# Patient Record
Sex: Male | Born: 1955
Health system: Southern US, Community
[De-identification: ages and names within clinical notes are randomized; demographics above are authoritative.]

## PROBLEM LIST (undated history)

## (undated) DIAGNOSIS — F419 Anxiety disorder, unspecified: Secondary | ICD-10-CM

## (undated) DIAGNOSIS — N400 Enlarged prostate without lower urinary tract symptoms: Secondary | ICD-10-CM

## (undated) DIAGNOSIS — G43909 Migraine, unspecified, not intractable, without status migrainosus: Secondary | ICD-10-CM

## (undated) DIAGNOSIS — N390 Urinary tract infection, site not specified: Secondary | ICD-10-CM

## (undated) DIAGNOSIS — C61 Malignant neoplasm of prostate: Secondary | ICD-10-CM

## (undated) DIAGNOSIS — F329 Major depressive disorder, single episode, unspecified: Secondary | ICD-10-CM

## (undated) DIAGNOSIS — F32A Depression, unspecified: Secondary | ICD-10-CM

## (undated) DIAGNOSIS — K219 Gastro-esophageal reflux disease without esophagitis: Secondary | ICD-10-CM

## (undated) HISTORY — PX: BIOPSY PROSTATE: PRO28

---

## 2003-06-29 ENCOUNTER — Encounter: Admission: RE | Admit: 2003-06-29 | Discharge: 2003-07-13 | Payer: Self-pay | Admitting: Family Medicine

## 2008-07-13 ENCOUNTER — Observation Stay (HOSPITAL_COMMUNITY): Admission: EM | Admit: 2008-07-13 | Discharge: 2008-07-14 | Payer: Self-pay | Admitting: Emergency Medicine

## 2011-03-28 NOTE — H&P (Signed)
Craig Butler, REPSHER NO.:  [id number]   MEDICAL RECORD NO.:  [id number]          PATIENT TYPE:  OBV   LOCATION:  1831                         FACILITY:  [facility]   PHYSICIAN:  Hollice Espy, M.D.DATE OF BIRTH:  12-13-1955   DATE OF ADMISSION:  07/13/2008  DATE OF DISCHARGE:                              HISTORY & PHYSICAL   PRIMARY CARE PHYSICIAN:  Dr. Duane Lope.   CHIEF COMPLAINT:  Chest pain.   HISTORY OF PRESENT ILLNESS:  Patient is a 55 year old white male with  essentially no past medical history who presented to the emergency room  today complaining of chest pain.  He specifically described that while  he was driving all of a sudden he started having a sharp pain just left  of his sternum but radiated up to his shoulder.  Again he described it  as sharp and started having some numbness going down his arm.  He said  it was quite intense and he actually felt some shortness of breath.  The  pain lasted for about 10 minutes and then started to wear off and  eventually went away.  He became concerned and came into the emergency  room for further evaluation.  In the emergency room he had a chest x-ray  done which was unremarkable, an EKG done which showed a normal sinus  rhythm and labs done including cardiac markers were normal.  Currently  he is doing well.  Denies any headaches, vision changes, dysphagia.  No  current chest pain, no palpitations.  No wheezing, coughing, shortness  of breath, abdominal pain, hematuria, dysuria, constipation, diarrhea,  focal extremity numbness, weakness or pain.   REVIEW OF SYSTEMS:  Otherwise negative.   PAST MEDICAL HISTORY:  Includes sleep disturbance and depression.   MEDICATIONS:  1. Wellbutrin which he says he takes for fatigue.  2. Lunesta nightly.  3. Aspirin 325.   HE HAS NO KNOWN DRUG ALLERGIES.   SOCIAL HISTORY:  He denies any tobacco, heavy alcohol or drug abuse.   FAMILY HISTORY:  Negative for any type  of heart disease.   PHYSICAL EXAM:  THE PATIENT'S VITALS ON ADMISSION:  Temperature 97.8,  heart rate 70, blood pressure 147/93, respirations 19, O2 sat 96% on  room air.  After receiving a dose of nitroglycerin his blood pressure  came down to as low as 86/61.  GENERAL:  He is alert and oriented x3, in no apparent stress.  HEENT: Normocephalic/atraumatic, mucous membranes are moist, he had no  carotid bruits.  HEART:  Regular rate and rhythm, S1-S2.  LUNGS:  Clear to auscultation bilaterally.  ABDOMEN:  Soft, nontender, nondistended, positive bowel sounds.  EXTREMITIES:  Shows no clubbing, cyanosis or edema.   Lab work, chest X-Ray and EKG are unremarkable.  White count 7.4, H&H  15.1 and 46, MCV of 92, blood count 233, CPK 60.9, MB less than 1,  troponin-I less than 0.05, second set is similar.  Sodium 140, potassium  3.9, chloride is 104, BUN 13, creatinine 1.2, glucose 98,   ASSESSMENT AND PLAN:  1. Chest pain, atypical.  Only risk  factor is age.  Checked 3 sets of      enzymes, EKG negative.  If 3 sets are negative discharge home with      an outpatient stress test.  2. Sleep disturbance.  Will give him p.r.n. Ambien.      Hollice Espy, M.D.  Electronically Signed     SKK/MEDQ  D:  07/13/2008  T:  07/13/2008  Job:  [id number]   cc:   C. Duane Lope, M.D.

## 2011-08-16 LAB — CARDIAC PANEL(CRET KIN+CKTOT+MB+TROPI)
CK, MB: 0.8
Relative Index: INVALID
Total CK: 66
Troponin I: <0.01

## 2013-12-09 ENCOUNTER — Encounter (HOSPITAL_COMMUNITY): Payer: Self-pay | Admitting: Emergency Medicine

## 2013-12-09 ENCOUNTER — Emergency Department (HOSPITAL_COMMUNITY)
Admission: EM | Admit: 2013-12-09 | Discharge: 2013-12-09 | Disposition: A | Discharge status: Home / Self Care | Payer: BC Managed Care – PPO | Attending: Emergency Medicine | Admitting: Emergency Medicine

## 2013-12-09 ENCOUNTER — Emergency Department (HOSPITAL_COMMUNITY): Payer: BC Managed Care – PPO

## 2013-12-09 DIAGNOSIS — Z79899 Other long term (current) drug therapy: Secondary | ICD-10-CM | POA: Insufficient documentation

## 2013-12-09 DIAGNOSIS — R000 Tachycardia, unspecified: Secondary | ICD-10-CM | POA: Insufficient documentation

## 2013-12-09 DIAGNOSIS — IMO0002 Reserved for concepts with insufficient information to code with codable children: Secondary | ICD-10-CM | POA: Insufficient documentation

## 2013-12-09 DIAGNOSIS — K219 Gastro-esophageal reflux disease without esophagitis: Secondary | ICD-10-CM | POA: Insufficient documentation

## 2013-12-09 DIAGNOSIS — Z8679 Personal history of other diseases of the circulatory system: Secondary | ICD-10-CM | POA: Insufficient documentation

## 2013-12-09 DIAGNOSIS — R945 Abnormal results of liver function studies: Secondary | ICD-10-CM

## 2013-12-09 DIAGNOSIS — Z87891 Personal history of nicotine dependence: Secondary | ICD-10-CM | POA: Insufficient documentation

## 2013-12-09 DIAGNOSIS — R112 Nausea with vomiting, unspecified: Secondary | ICD-10-CM | POA: Insufficient documentation

## 2013-12-09 DIAGNOSIS — R7989 Other specified abnormal findings of blood chemistry: Secondary | ICD-10-CM | POA: Insufficient documentation

## 2013-12-09 DIAGNOSIS — N12 Tubulo-interstitial nephritis, not specified as acute or chronic: Secondary | ICD-10-CM | POA: Insufficient documentation

## 2013-12-09 DIAGNOSIS — R197 Diarrhea, unspecified: Secondary | ICD-10-CM | POA: Insufficient documentation

## 2013-12-09 DIAGNOSIS — Z7982 Long term (current) use of aspirin: Secondary | ICD-10-CM | POA: Insufficient documentation

## 2013-12-09 HISTORY — DX: Gastro-esophageal reflux disease without esophagitis: K21.9

## 2013-12-09 HISTORY — DX: Migraine, unspecified, not intractable, without status migrainosus: G43.909

## 2013-12-09 LAB — CBC WITH DIFFERENTIAL/PLATELET
Basophils Absolute: 0 10*3/uL (ref 0.0–0.1)
Basophils Relative: 0 % (ref 0–1)
Eosinophils Absolute: 0.1 10*3/uL (ref 0.0–0.7)
Eosinophils Relative: 1 % (ref 0–5)
HCT: 45.5 % (ref 39.0–52.0)
Hemoglobin: 15.7 g/dL (ref 13.0–17.0)
Lymphocytes Relative: 4 % — ABNORMAL LOW (ref 12–46)
Lymphs Abs: 0.5 10*3/uL — ABNORMAL LOW (ref 0.7–4.0)
MCH: 31.4 pg (ref 26.0–34.0)
MCHC: 34.5 g/dL (ref 30.0–36.0)
MCV: 91 fL (ref 78.0–100.0)
Monocytes Absolute: 0.7 10*3/uL (ref 0.1–1.0)
Monocytes Relative: 6 % (ref 3–12)
Neutro Abs: 9.9 10*3/uL — ABNORMAL HIGH (ref 1.7–7.7)
Neutrophils Relative %: 89 % — ABNORMAL HIGH (ref 43–77)
Platelets: 154 10*3/uL (ref 150–400)
RBC: 5 MIL/uL (ref 4.22–5.81)
RDW: 13.1 % (ref 11.5–15.5)
WBC: 11.1 10*3/uL — ABNORMAL HIGH (ref 4.0–10.5)

## 2013-12-09 LAB — URINE MICROSCOPIC-ADD ON

## 2013-12-09 LAB — COMPREHENSIVE METABOLIC PANEL
ALT: 160 U/L — ABNORMAL HIGH (ref 0–53)
AST: 129 U/L — ABNORMAL HIGH (ref 0–37)
Albumin: 3.3 g/dL — ABNORMAL LOW (ref 3.5–5.2)
Alkaline Phosphatase: 83 U/L (ref 39–117)
BUN: 16 mg/dL (ref 6–23)
CO2: 22 mEq/L (ref 19–32)
Calcium: 8 mg/dL — ABNORMAL LOW (ref 8.4–10.5)
Chloride: 104 mEq/L (ref 96–112)
Creatinine, Ser: 1.09 mg/dL (ref 0.50–1.35)
GFR calc Af Amer: 86 mL/min — ABNORMAL LOW (ref >90)
GFR calc non Af Amer: 74 mL/min — ABNORMAL LOW (ref >90)
Glucose, Bld: 111 mg/dL — ABNORMAL HIGH (ref 70–99)
Potassium: 4.4 mEq/L (ref 3.7–5.3)
Sodium: 138 mEq/L (ref 137–147)
Total Bilirubin: 1.4 mg/dL — ABNORMAL HIGH (ref 0.3–1.2)
Total Protein: 6.2 g/dL (ref 6.0–8.3)

## 2013-12-09 LAB — URINALYSIS, ROUTINE W REFLEX MICROSCOPIC
Glucose, UA: NEGATIVE mg/dL
Hgb urine dipstick: NEGATIVE
Ketones, ur: NEGATIVE mg/dL
Nitrite: POSITIVE — AB
Protein, ur: 30 mg/dL — AB
Specific Gravity, Urine: >1.046 — ABNORMAL HIGH (ref 1.005–1.030)
Urobilinogen, UA: 1 mg/dL (ref 0.0–1.0)
pH: 5.5 (ref 5.0–8.0)

## 2013-12-09 LAB — LIPASE, BLOOD: Lipase: 26 U/L (ref 11–59)

## 2013-12-09 MED ORDER — OXYCODONE HCL 5 MG PO TABS: 5.0000 mg | ORAL_TABLET | ORAL | Status: DC | PRN

## 2013-12-09 MED ORDER — SODIUM CHLORIDE 0.9 % IV BOLUS (SEPSIS)
1000.0000 mL | Freq: Once | INTRAVENOUS | Status: AC
  Administered 2013-12-09: 1000 mL via INTRAVENOUS

## 2013-12-09 MED ORDER — DEXTROSE 5 % IV SOLN
1.0000 g | Freq: Once | INTRAVENOUS | Status: AC
  Administered 2013-12-09: 1 g via INTRAVENOUS
  Filled 2013-12-09: qty 10

## 2013-12-09 MED ORDER — CEPHALEXIN 500 MG PO CAPS: 500.0000 mg | ORAL_CAPSULE | Freq: Four times a day (QID) | ORAL | Status: DC

## 2013-12-09 MED ORDER — MORPHINE SULFATE 4 MG/ML IJ SOLN
6.0000 mg | Freq: Once | INTRAMUSCULAR | Status: AC
  Administered 2013-12-09: 6 mg via INTRAVENOUS
  Filled 2013-12-09: qty 2

## 2013-12-09 MED ORDER — HYDROMORPHONE HCL PF 1 MG/ML IJ SOLN
1.0000 mg | Freq: Once | INTRAMUSCULAR | Status: AC
  Administered 2013-12-09: 1 mg via INTRAVENOUS
  Filled 2013-12-09: qty 1

## 2013-12-09 MED ORDER — OXYCODONE HCL 5 MG PO TABS
10.0000 mg | ORAL_TABLET | Freq: Once | ORAL | Status: AC
  Administered 2013-12-09: 10 mg via ORAL
  Filled 2013-12-09: qty 2

## 2013-12-09 MED ORDER — ONDANSETRON 4 MG PO TBDP
4.0000 mg | ORAL_TABLET | Freq: Once | ORAL | Status: AC
  Administered 2013-12-09: 4 mg via ORAL
  Filled 2013-12-09: qty 1

## 2013-12-09 NOTE — Discharge Instructions (Signed)

## 2013-12-09 NOTE — ED Provider Notes (Signed)
CSN: [id number]     Arrival date & time 12/09/13  1424 History   First MD Initiated Contact with Patient 12/09/13 1543     Chief Complaint  Patient presents with  . Fever  . Abdominal Pain   (Consider location/radiation/quality/duration/timing/severity/associated sxs/prior Treatment) HPI  58 year old male with abdominal pain. Onset around Sunday. Initially intermittent, but now more or less constant. Pain is in the right upper quadrant/right flank and radiates into his mid back. Associated with fever. Additionally nausea, vomiting and diarrhea. No history similar symptoms. He has a gallbladder. No past history kidney stones. No urinary complaints. No sick contacts.  Past Medical History  Diagnosis Date  . Acid reflux   . Migraine    History reviewed. No pertinent past surgical history. Family History  Problem Relation Age of Onset  . Cancer Father   . Cancer Brother    History  Substance Use Topics  . Smoking status: Former Research scientist (life sciences)  . Smokeless tobacco: Not on file  . Alcohol Use: Yes    Review of Systems  All systems reviewed and negative, other than as noted in HPI.   Allergies  Review of patient's allergies indicates no known allergies.  Home Medications   Current Outpatient Rx  Name  Route  Sig  Dispense  Refill  . aspirin 81 MG tablet   Oral   Take 81-162 mg by mouth daily.         Marland Kitchen buPROPion (WELLBUTRIN XL) 300 MG 24 hr tablet   Oral   Take 300 mg by mouth every morning.         . calcium & magnesium carbonates (MYLANTA) 629-528 MG per tablet   Oral   Take 1 tablet by mouth every morning.         . calcium carbonate (TUMS - DOSED IN MG ELEMENTAL CALCIUM) 500 MG chewable tablet   Oral   Chew 1 tablet by mouth at bedtime.         . fluticasone (FLONASE) 50 MCG/ACT nasal spray   Each Nare   Place 2 sprays into both nostrils daily.         Marland Kitchen omeprazole (PRILOSEC) 20 MG capsule   Oral   Take 20 mg by mouth daily.         . vitamin E 100  UNIT capsule   Oral   Take 100 Units by mouth every morning.          BP 100/49  Pulse 105  Temp(Src) 98.4 F (36.9 C) (Oral)  Resp 16  SpO2 95% Physical Exam  Nursing note and vitals reviewed. Constitutional: He appears well-developed and well-nourished. No distress.  HENT:  Head: Normocephalic and atraumatic.  Eyes: Conjunctivae are normal. Right eye exhibits no discharge. Left eye exhibits no discharge.  Neck: Neck supple.  Cardiovascular: Regular rhythm and normal heart sounds.  Exam reveals no gallop and no friction rub.   No murmur heard. Tachycardic  Pulmonary/Chest: Effort normal and breath sounds normal. No respiratory distress.  Abdominal: Soft. He exhibits no distension. There is tenderness.  Right flank tenderness. No rebound or guarding. No overlying skin changes. No distention. No surgical scars noted.  Genitourinary:  Right CVA tenderness  Musculoskeletal: He exhibits no edema and no tenderness.  Neurological: He is alert.  Skin: Skin is warm and dry.  Psychiatric: He has a normal mood and affect. His behavior is normal. Thought content normal.    ED Course  Procedures (including critical care time) Labs Review Labs  Reviewed  CBC WITH DIFFERENTIAL - Abnormal; Notable for the following:    WBC 11.1 (*)    Neutrophils Relative % 89 (*)    Neutro Abs 9.9 (*)    Lymphocytes Relative 4 (*)    Lymphs Abs 0.5 (*)    All other components within normal limits  URINALYSIS, ROUTINE W REFLEX MICROSCOPIC - Abnormal; Notable for the following:    Color, Urine ORANGE (*)    Specific Gravity, Urine >1.046 (*)    Bilirubin Urine MODERATE (*)    Protein, ur 30 (*)    Nitrite POSITIVE (*)    Leukocytes, UA SMALL (*)    All other components within normal limits  URINE MICROSCOPIC-ADD ON - Abnormal; Notable for the following:    Bacteria, UA FEW (*)    Casts HYALINE CASTS (*)    All other components within normal limits  COMPREHENSIVE METABOLIC PANEL - Abnormal;  Notable for the following:    Glucose, Bld 111 (*)    Calcium 8.0 (*)    Albumin 3.3 (*)    AST 129 (*)    ALT 160 (*)    Total Bilirubin 1.4 (*)    GFR calc non Af Amer 74 (*)    GFR calc Af Amer 86 (*)    All other components within normal limits  URINE CULTURE  LIPASE, BLOOD   Imaging Review US Abdomen Limited  12/09/2013   CLINICAL DATA:  Fever.  Right upper quadrant pain.  EXAM: US ABDOMEN LIMITED - RIGHT UPPER QUADRANT  COMPARISON:  None available.  FINDINGS: Gallbladder:  Gallbladder wall measures between 3 mm and 4 mm. No stones, sludge, pericholecystic fluid or wall thickening. Neg tenderness over the gallbladder.  Common bile duct:  Diameter: Normal at 4 mm.  Liver:  No focal lesion identified. Within normal limits in parenchymal echogenicity.  IMPRESSION: Negative for cholelithiasis or cholecystitis.   Electronically Signed   By: Dereck Ligas M.D.   On: 12/09/2013 21:17    EKG Interpretation   None       MDM   1. Pyelonephritis   2. Abnormal LFTs     57y male with right flank pain. Suspect pyelonephritis. Urinalysis is consistent with UTI. He is tender along his right flank and has right CVA tenderness. Consider cholecystitis, but doubt. This pain is more lateral than is typical for this.   [place] ultimately done because of abnormal LFTs. Not consistent with cholecystitis. Again gives support to pyelo. Unsure of significance of abnormal LFTs and if relates to current symptoms. Liver does not feel enlarged or tender. Outpt FU for repeat   Virgel Manifold, MD 12/11/13 1404

## 2013-12-09 NOTE — Progress Notes (Signed)
   CARE MANAGEMENT ED NOTE 12/09/2013  Patient:  Craig Butler, Craig Butler   Account Number:  [id number]  Date Initiated:  12/09/2013  Documentation initiated by:  Jackelyn Poling  Subjective/Objective Assessment:   58 yr old bcbs [place] ppo pt without pcp listed He confirms Dr Melinda Crutch as pcp     Subjective/Objective Assessment Detail:   c/o fever/ right upper quad pain since Sunday; n/v/d     Action/Plan:   EPIC updated   Action/Plan Detail:   Anticipated DC Date:       Status Recommendation to Physician:   Result of Recommendation:    Other ED Services  Consult [facility]  Other  Outpatient Services - Pt will follow up  PCP issues    Choice offered to / List presented to:            Status of service:  Completed, signed off  ED Comments:   ED Comments Detail:

## 2013-12-09 NOTE — ED Notes (Signed)
US at bedside

## 2013-12-09 NOTE — ED Notes (Signed)
Pt sent from [place] at [facility] for c/o fever/ right upper quad pain since Sunday; n/v/d

## 2013-12-10 LAB — URINE CULTURE: Colony Count: 40000

## 2013-12-15 ENCOUNTER — Other Ambulatory Visit: Payer: Self-pay | Admitting: Gastroenterology

## 2013-12-15 DIAGNOSIS — R1011 Right upper quadrant pain: Secondary | ICD-10-CM

## 2013-12-15 DIAGNOSIS — R7989 Other specified abnormal findings of blood chemistry: Secondary | ICD-10-CM

## 2013-12-15 DIAGNOSIS — R945 Abnormal results of liver function studies: Secondary | ICD-10-CM

## 2013-12-16 ENCOUNTER — Ambulatory Visit
Admission: RE | Admit: 2013-12-16 | Discharge: 2013-12-16 | Disposition: A | Discharge status: Home / Self Care | Payer: BC Managed Care – PPO | Source: Ambulatory Visit | Attending: Gastroenterology | Admitting: Gastroenterology

## 2013-12-16 DIAGNOSIS — R1011 Right upper quadrant pain: Secondary | ICD-10-CM

## 2013-12-16 DIAGNOSIS — R945 Abnormal results of liver function studies: Secondary | ICD-10-CM

## 2013-12-16 DIAGNOSIS — R7989 Other specified abnormal findings of blood chemistry: Secondary | ICD-10-CM

## 2013-12-16 MED ORDER — IOHEXOL 300 MG/ML  SOLN
125.0000 mL | Freq: Once | INTRAMUSCULAR | Status: AC | PRN
  Administered 2013-12-16: 125 mL via INTRAVENOUS

## 2014-12-23 ENCOUNTER — Other Ambulatory Visit (HOSPITAL_COMMUNITY): Payer: Self-pay | Admitting: Urology

## 2014-12-23 DIAGNOSIS — C61 Malignant neoplasm of prostate: Secondary | ICD-10-CM

## 2015-03-19 ENCOUNTER — Ambulatory Visit (HOSPITAL_COMMUNITY): Admission: RE | Admit: 2015-03-19 | Payer: Self-pay | Source: Ambulatory Visit

## 2015-07-24 ENCOUNTER — Inpatient Hospital Stay (HOSPITAL_COMMUNITY)
Admission: EM | Admit: 2015-07-24 | Discharge: 2015-07-30 | DRG: 862 | Disposition: A | Discharge status: Home Health | Payer: Managed Care, Other (non HMO) | Attending: Family Medicine | Admitting: Family Medicine

## 2015-07-24 ENCOUNTER — Encounter (HOSPITAL_COMMUNITY): Payer: Self-pay | Admitting: Internal Medicine

## 2015-07-24 ENCOUNTER — Emergency Department (HOSPITAL_COMMUNITY): Payer: Managed Care, Other (non HMO)

## 2015-07-24 DIAGNOSIS — G43909 Migraine, unspecified, not intractable, without status migrainosus: Secondary | ICD-10-CM | POA: Diagnosis present

## 2015-07-24 DIAGNOSIS — R140 Abdominal distension (gaseous): Secondary | ICD-10-CM

## 2015-07-24 DIAGNOSIS — N419 Inflammatory disease of prostate, unspecified: Secondary | ICD-10-CM | POA: Diagnosis present

## 2015-07-24 DIAGNOSIS — Z9889 Other specified postprocedural states: Secondary | ICD-10-CM | POA: Diagnosis not present

## 2015-07-24 DIAGNOSIS — E669 Obesity, unspecified: Secondary | ICD-10-CM | POA: Diagnosis present

## 2015-07-24 DIAGNOSIS — R338 Other retention of urine: Secondary | ICD-10-CM

## 2015-07-24 DIAGNOSIS — A419 Sepsis, unspecified organism: Secondary | ICD-10-CM | POA: Diagnosis present

## 2015-07-24 DIAGNOSIS — M549 Dorsalgia, unspecified: Secondary | ICD-10-CM | POA: Diagnosis present

## 2015-07-24 DIAGNOSIS — F419 Anxiety disorder, unspecified: Secondary | ICD-10-CM | POA: Diagnosis present

## 2015-07-24 DIAGNOSIS — K5909 Other constipation: Secondary | ICD-10-CM | POA: Diagnosis not present

## 2015-07-24 DIAGNOSIS — Z6837 Body mass index (BMI) 37.0-37.9, adult: Secondary | ICD-10-CM

## 2015-07-24 DIAGNOSIS — Z87891 Personal history of nicotine dependence: Secondary | ICD-10-CM | POA: Diagnosis not present

## 2015-07-24 DIAGNOSIS — N179 Acute kidney failure, unspecified: Secondary | ICD-10-CM | POA: Diagnosis present

## 2015-07-24 DIAGNOSIS — A4151 Sepsis due to Escherichia coli [E. coli]: Secondary | ICD-10-CM | POA: Diagnosis present

## 2015-07-24 DIAGNOSIS — K567 Ileus, unspecified: Secondary | ICD-10-CM | POA: Diagnosis not present

## 2015-07-24 DIAGNOSIS — T814XXA Infection following a procedure, initial encounter: Secondary | ICD-10-CM | POA: Diagnosis not present

## 2015-07-24 DIAGNOSIS — E872 Acidosis: Secondary | ICD-10-CM | POA: Diagnosis present

## 2015-07-24 DIAGNOSIS — F32A Depression, unspecified: Secondary | ICD-10-CM | POA: Diagnosis present

## 2015-07-24 DIAGNOSIS — N390 Urinary tract infection, site not specified: Secondary | ICD-10-CM | POA: Diagnosis present

## 2015-07-24 DIAGNOSIS — K5900 Constipation, unspecified: Secondary | ICD-10-CM

## 2015-07-24 DIAGNOSIS — Y838 Other surgical procedures as the cause of abnormal reaction of the patient, or of later complication, without mention of misadventure at the time of the procedure: Secondary | ICD-10-CM | POA: Diagnosis present

## 2015-07-24 DIAGNOSIS — I959 Hypotension, unspecified: Secondary | ICD-10-CM | POA: Diagnosis present

## 2015-07-24 DIAGNOSIS — T40605A Adverse effect of unspecified narcotics, initial encounter: Secondary | ICD-10-CM | POA: Diagnosis not present

## 2015-07-24 DIAGNOSIS — R509 Fever, unspecified: Secondary | ICD-10-CM

## 2015-07-24 DIAGNOSIS — Z79899 Other long term (current) drug therapy: Secondary | ICD-10-CM

## 2015-07-24 DIAGNOSIS — I5031 Acute diastolic (congestive) heart failure: Secondary | ICD-10-CM | POA: Diagnosis not present

## 2015-07-24 DIAGNOSIS — C61 Malignant neoplasm of prostate: Secondary | ICD-10-CM | POA: Diagnosis present

## 2015-07-24 DIAGNOSIS — K219 Gastro-esophageal reflux disease without esophagitis: Secondary | ICD-10-CM | POA: Diagnosis present

## 2015-07-24 DIAGNOSIS — R6520 Severe sepsis without septic shock: Secondary | ICD-10-CM | POA: Diagnosis present

## 2015-07-24 DIAGNOSIS — Z7982 Long term (current) use of aspirin: Secondary | ICD-10-CM | POA: Diagnosis not present

## 2015-07-24 DIAGNOSIS — F319 Bipolar disorder, unspecified: Secondary | ICD-10-CM | POA: Diagnosis present

## 2015-07-24 DIAGNOSIS — R319 Hematuria, unspecified: Secondary | ICD-10-CM | POA: Diagnosis not present

## 2015-07-24 DIAGNOSIS — F329 Major depressive disorder, single episode, unspecified: Secondary | ICD-10-CM | POA: Diagnosis present

## 2015-07-24 DIAGNOSIS — G8929 Other chronic pain: Secondary | ICD-10-CM | POA: Diagnosis present

## 2015-07-24 DIAGNOSIS — J9601 Acute respiratory failure with hypoxia: Secondary | ICD-10-CM

## 2015-07-24 DIAGNOSIS — K5901 Slow transit constipation: Secondary | ICD-10-CM | POA: Diagnosis not present

## 2015-07-24 HISTORY — DX: Major depressive disorder, single episode, unspecified: F32.9

## 2015-07-24 HISTORY — DX: Anxiety disorder, unspecified: F41.9

## 2015-07-24 HISTORY — DX: Depression, unspecified: F32.A

## 2015-07-24 LAB — RAPID URINE DRUG SCREEN, HOSP PERFORMED
Amphetamines: NOT DETECTED
Barbiturates: NOT DETECTED
Benzodiazepines: POSITIVE — AB
Cocaine: NOT DETECTED
OPIATES: NOT DETECTED
Tetrahydrocannabinol: NOT DETECTED

## 2015-07-24 LAB — URINALYSIS, ROUTINE W REFLEX MICROSCOPIC
BILIRUBIN URINE: NEGATIVE
GLUCOSE, UA: NEGATIVE mg/dL
KETONES UR: NEGATIVE mg/dL
Nitrite: NEGATIVE
PH: 7 (ref 5.0–8.0)
Protein, ur: NEGATIVE mg/dL
Specific Gravity, Urine: 1.015 (ref 1.005–1.030)
Urobilinogen, UA: 1 mg/dL (ref 0.0–1.0)

## 2015-07-24 LAB — COMPREHENSIVE METABOLIC PANEL
ALBUMIN: 4.2 g/dL (ref 3.5–5.0)
ALT: 24 U/L (ref 17–63)
AST: 23 U/L (ref 15–41)
Alkaline Phosphatase: 79 U/L (ref 38–126)
Anion gap: 9 (ref 5–15)
BILIRUBIN TOTAL: 1.3 mg/dL — AB (ref 0.3–1.2)
BUN: 18 mg/dL (ref 6–20)
CHLORIDE: 105 mmol/L (ref 101–111)
CO2: 22 mmol/L (ref 22–32)
CREATININE: 1.26 mg/dL — AB (ref 0.61–1.24)
Calcium: 9.2 mg/dL (ref 8.9–10.3)
GFR calc Af Amer: >60 mL/min (ref >60)
GFR calc non Af Amer: >60 mL/min (ref >60)
GLUCOSE: 117 mg/dL — AB (ref 65–99)
POTASSIUM: 3.9 mmol/L (ref 3.5–5.1)
Sodium: 136 mmol/L (ref 135–145)
Total Protein: 6.8 g/dL (ref 6.5–8.1)

## 2015-07-24 LAB — CBC WITH DIFFERENTIAL/PLATELET
Basophils Absolute: 0 10*3/uL (ref 0.0–0.1)
Basophils Relative: 0 % (ref 0–1)
EOS ABS: 0 10*3/uL (ref 0.0–0.7)
EOS PCT: 0 % (ref 0–5)
HCT: 46.8 % (ref 39.0–52.0)
Hemoglobin: 15.9 g/dL (ref 13.0–17.0)
LYMPHS ABS: 0.2 10*3/uL — AB (ref 0.7–4.0)
LYMPHS PCT: 3 % — AB (ref 12–46)
MCH: 31.1 pg (ref 26.0–34.0)
MCHC: 34 g/dL (ref 30.0–36.0)
MCV: 91.4 fL (ref 78.0–100.0)
MONO ABS: 0 10*3/uL — AB (ref 0.1–1.0)
Monocytes Relative: 1 % — ABNORMAL LOW (ref 3–12)
Neutro Abs: 7 10*3/uL (ref 1.7–7.7)
Neutrophils Relative %: 96 % — ABNORMAL HIGH (ref 43–77)
PLATELETS: 169 10*3/uL (ref 150–400)
RBC: 5.12 MIL/uL (ref 4.22–5.81)
RDW: 12.9 % (ref 11.5–15.5)
WBC: 7.3 10*3/uL (ref 4.0–10.5)

## 2015-07-24 LAB — APTT: APTT: 31 s (ref 24–37)

## 2015-07-24 LAB — I-STAT CG4 LACTIC ACID, ED
LACTIC ACID, VENOUS: 2.39 mmol/L — AB (ref 0.5–2.0)
Lactic Acid, Venous: 2.39 mmol/L (ref 0.5–2.0)

## 2015-07-24 LAB — PROTIME-INR
INR: 1.23 (ref 0.00–1.49)
Prothrombin Time: 15.6 seconds — ABNORMAL HIGH (ref 11.6–15.2)

## 2015-07-24 LAB — PROCALCITONIN: Procalcitonin: 47.01 ng/mL

## 2015-07-24 LAB — URINE MICROSCOPIC-ADD ON

## 2015-07-24 LAB — LACTIC ACID, PLASMA: LACTIC ACID, VENOUS: 1.6 mmol/L (ref 0.5–2.0)

## 2015-07-24 MED ORDER — CALCIUM CARBONATE ANTACID 500 MG PO CHEW
1.0000 | CHEWABLE_TABLET | Freq: Every day | ORAL | Status: DC
  Administered 2015-07-24 – 2015-07-26 (×3): 200 mg via ORAL
  Filled 2015-07-24 (×4): qty 1

## 2015-07-24 MED ORDER — GENTAMICIN SULFATE 40 MG/ML IJ SOLN
5.0000 mg/kg | Freq: Once | INTRAVENOUS | Status: DC
  Filled 2015-07-24: qty 10.75

## 2015-07-24 MED ORDER — SODIUM CHLORIDE 0.9 % IJ SOLN
3.0000 mL | Freq: Two times a day (BID) | INTRAMUSCULAR | Status: DC
  Administered 2015-07-24 – 2015-07-28 (×3): 3 mL via INTRAVENOUS

## 2015-07-24 MED ORDER — PANTOPRAZOLE SODIUM 40 MG PO TBEC
40.0000 mg | DELAYED_RELEASE_TABLET | Freq: Every day | ORAL | Status: DC
Start: 2015-07-24 — End: 2015-07-30
  Administered 2015-07-24 – 2015-07-29 (×6): 40 mg via ORAL
  Filled 2015-07-24 (×6): qty 1

## 2015-07-24 MED ORDER — GENTAMICIN SULFATE 40 MG/ML IJ SOLN
5.0000 mg/kg | INTRAVENOUS | Status: DC
  Filled 2015-07-24: qty 10.75

## 2015-07-24 MED ORDER — VANCOMYCIN HCL IN DEXTROSE 750-5 MG/150ML-% IV SOLN
750.0000 mg | Freq: Two times a day (BID) | INTRAVENOUS | Status: DC
  Filled 2015-07-24 (×2): qty 150

## 2015-07-24 MED ORDER — MORPHINE SULFATE (PF) 2 MG/ML IV SOLN
1.0000 mg | INTRAVENOUS | Status: DC | PRN
  Administered 2015-07-24 – 2015-07-26 (×8): 1 mg via INTRAVENOUS
  Filled 2015-07-24 (×8): qty 1

## 2015-07-24 MED ORDER — PIPERACILLIN-TAZOBACTAM 3.375 G IVPB
3.3750 g | Freq: Three times a day (TID) | INTRAVENOUS | Status: DC
  Administered 2015-07-25 – 2015-07-26 (×5): 3.375 g via INTRAVENOUS
  Filled 2015-07-24 (×9): qty 50

## 2015-07-24 MED ORDER — ONDANSETRON HCL 4 MG/2ML IJ SOLN
4.0000 mg | Freq: Four times a day (QID) | INTRAMUSCULAR | Status: DC | PRN
  Administered 2015-07-24: 4 mg via INTRAVENOUS
  Filled 2015-07-24: qty 2

## 2015-07-24 MED ORDER — ASPIRIN 81 MG PO CHEW
81.0000 mg | CHEWABLE_TABLET | Freq: Every day | ORAL | Status: DC
  Administered 2015-07-24 – 2015-07-26 (×3): 81 mg via ORAL
  Filled 2015-07-24 (×3): qty 1

## 2015-07-24 MED ORDER — PIPERACILLIN-TAZOBACTAM 3.375 G IVPB 30 MIN
3.3750 g | Freq: Three times a day (TID) | INTRAVENOUS | Status: DC
  Administered 2015-07-24: 3.375 g via INTRAVENOUS
  Filled 2015-07-24: qty 50

## 2015-07-24 MED ORDER — FLUTICASONE PROPIONATE 50 MCG/ACT NA SUSP
2.0000 | Freq: Every day | NASAL | Status: DC
  Administered 2015-07-24: 2 via NASAL
  Filled 2015-07-24 (×2): qty 16

## 2015-07-24 MED ORDER — SODIUM CHLORIDE 0.9 % IV BOLUS (SEPSIS)
1000.0000 mL | INTRAVENOUS | Status: AC
  Administered 2015-07-24 (×3): 1000 mL via INTRAVENOUS

## 2015-07-24 MED ORDER — IOHEXOL 300 MG/ML  SOLN
100.0000 mL | Freq: Once | INTRAMUSCULAR | Status: AC | PRN
  Administered 2015-07-24: 100 mL via INTRAVENOUS

## 2015-07-24 MED ORDER — MORPHINE SULFATE (PF) 2 MG/ML IV SOLN
1.0000 mg | Freq: Once | INTRAVENOUS | Status: DC
  Filled 2015-07-24: qty 1

## 2015-07-24 MED ORDER — OXYCODONE-ACETAMINOPHEN 5-325 MG PO TABS
2.0000 | ORAL_TABLET | Freq: Four times a day (QID) | ORAL | Status: DC | PRN
  Administered 2015-07-24 – 2015-07-26 (×5): 2 via ORAL
  Filled 2015-07-24 (×7): qty 2

## 2015-07-24 MED ORDER — SODIUM CHLORIDE 0.9 % IV BOLUS (SEPSIS)
1500.0000 mL | INTRAVENOUS | Status: AC
  Administered 2015-07-24 (×3): 1500 mL via INTRAVENOUS

## 2015-07-24 MED ORDER — DEXTROSE 5 % IV SOLN
7.0000 mg/kg | INTRAVENOUS | Status: DC
  Filled 2015-07-24: qty 15

## 2015-07-24 MED ORDER — ACETAMINOPHEN 650 MG RE SUPP: 650.0000 mg | Freq: Four times a day (QID) | RECTAL | Status: DC | PRN

## 2015-07-24 MED ORDER — VANCOMYCIN HCL 10 G IV SOLR
2250.0000 mg | Freq: Once | INTRAVENOUS | Status: AC
  Administered 2015-07-24: 2250 mg via INTRAVENOUS
  Filled 2015-07-24: qty 2250

## 2015-07-24 MED ORDER — SODIUM CHLORIDE 0.9 % IV SOLN
INTRAVENOUS | Status: DC
  Administered 2015-07-24 – 2015-07-26 (×2): via INTRAVENOUS

## 2015-07-24 MED ORDER — VITAMIN E 45 MG (100 UNIT) PO CAPS
100.0000 [IU] | ORAL_CAPSULE | Freq: Every morning | ORAL | Status: DC
  Administered 2015-07-24 – 2015-07-29 (×6): 100 [IU] via ORAL
  Filled 2015-07-24 (×8): qty 1

## 2015-07-24 MED ORDER — ALUM & MAG HYDROXIDE-SIMETH 200-200-20 MG/5ML PO SUSP
30.0000 mL | Freq: Four times a day (QID) | ORAL | Status: DC | PRN
  Administered 2015-07-27: 30 mL via ORAL
  Filled 2015-07-24: qty 30

## 2015-07-24 MED ORDER — ACETAMINOPHEN 325 MG PO TABS
650.0000 mg | ORAL_TABLET | Freq: Four times a day (QID) | ORAL | Status: DC | PRN
Start: 2015-07-24 — End: 2015-07-26
  Administered 2015-07-24: 650 mg via ORAL
  Filled 2015-07-24: qty 2

## 2015-07-24 MED ORDER — ONDANSETRON HCL 4 MG PO TABS: 4.0000 mg | ORAL_TABLET | Freq: Four times a day (QID) | ORAL | Status: DC | PRN

## 2015-07-24 NOTE — Consult Note (Signed)
Reason for Consult: Sepsis after Prostate Biopsy, Low Risk Prostate Cancer  Referring Physician: Pattricia Boss MD  Craig Butler is an 59 y.o. male.   HPI:   1 - Sepsis after Prostate Biopsy - fevers, tachycardia and hypotension by ER eval of subjective fevers 1 day after transrectal prostate biopsy. Had Levaquin peri-procedurally. Most recent UA pre-procedure w/o infectious parameters. CT pelvis from ER w/o hematoma / fluid collections / peraprostatic gas.  2 - Low Risk Prostate Cancer - Gleason 6 <5% two cores by biopsy 11/2014 on eval elevated and rising PSA. Initially on active surveillance with first surveillance biopsy path pending 07/2015. Most recent PSA 4.8.  Today "Craig Butler" is seen as emergent consult for above. We had discussed his clinical situation by phone earlier today and he went to ER as instructed.   Past Medical History  Diagnosis Date  . Acid reflux   . Migraine     No past surgical history on file.  Family History  Problem Relation Age of Onset  . Cancer Father   . Cancer Brother     Social History:  reports that he has quit smoking. He does not have any smokeless tobacco history on file. He reports that he drinks alcohol. His drug history is not on file.  Allergies: No Known Allergies  Medications: I have reviewed the patient's current medications.  Results for orders placed or performed during the hospital encounter of 07/24/15 (from the past 48 hour(s))  Urinalysis, Routine w reflex microscopic (not at [facility])     Status: Abnormal   Collection Time: 07/24/15  4:37 PM  Result Value Ref Range   Color, Urine YELLOW YELLOW   APPearance CLOUDY (A) CLEAR   Specific Gravity, Urine 1.015 1.005 - 1.030   pH 7.0 5.0 - 8.0   Glucose, UA NEGATIVE NEGATIVE mg/dL   Hgb urine dipstick TRACE (A) NEGATIVE   Bilirubin Urine NEGATIVE NEGATIVE   Ketones, ur NEGATIVE NEGATIVE mg/dL   Protein, ur NEGATIVE NEGATIVE mg/dL   Urobilinogen, UA 1.0 0.0 - 1.0 mg/dL   Nitrite  NEGATIVE NEGATIVE   Leukocytes, UA MODERATE (A) NEGATIVE  Urine microscopic-add on     Status: Abnormal   Collection Time: 07/24/15  4:37 PM  Result Value Ref Range   WBC, UA 21-50 <3 WBC/hpf   RBC / HPF 0-2 <3 RBC/hpf   Bacteria, UA MANY (A) RARE  Comprehensive metabolic panel     Status: Abnormal   Collection Time: 07/24/15  5:03 PM  Result Value Ref Range   Sodium 136 135 - 145 mmol/L   Potassium 3.9 3.5 - 5.1 mmol/L   Chloride 105 101 - 111 mmol/L   CO2 22 22 - 32 mmol/L   Glucose, Bld 117 (H) 65 - 99 mg/dL   BUN 18 6 - 20 mg/dL   Creatinine, Ser 1.26 (H) 0.61 - 1.24 mg/dL   Calcium 9.2 8.9 - 10.3 mg/dL   Total Protein 6.8 6.5 - 8.1 g/dL   Albumin 4.2 3.5 - 5.0 g/dL   AST 23 15 - 41 U/L   ALT 24 17 - 63 U/L   Alkaline Phosphatase 79 38 - 126 U/L   Total Bilirubin 1.3 (H) 0.3 - 1.2 mg/dL   GFR calc non Af Amer >60 >60 mL/min   GFR calc Af Amer >60 >60 mL/min    Comment: (NOTE) The eGFR has been calculated using the CKD EPI equation. This calculation has not been validated in all clinical situations. eGFR's persistently <60  mL/min signify possible Chronic Kidney Disease.    Anion gap 9 5 - 15  CBC WITH DIFFERENTIAL     Status: Abnormal   Collection Time: 07/24/15  5:03 PM  Result Value Ref Range   WBC 7.3 4.0 - 10.5 K/uL   RBC 5.12 4.22 - 5.81 MIL/uL   Hemoglobin 15.9 13.0 - 17.0 g/dL   HCT 46.8 39.0 - 52.0 %   MCV 91.4 78.0 - 100.0 fL   MCH 31.1 26.0 - 34.0 pg   MCHC 34.0 30.0 - 36.0 g/dL   RDW 12.9 11.5 - 15.5 %   Platelets 169 150 - 400 K/uL   Neutrophils Relative % 96 (H) 43 - 77 %   Neutro Abs 7.0 1.7 - 7.7 K/uL   Lymphocytes Relative 3 (L) 12 - 46 %   Lymphs Abs 0.2 (L) 0.7 - 4.0 K/uL   Monocytes Relative 1 (L) 3 - 12 %   Monocytes Absolute 0.0 (L) 0.1 - 1.0 K/uL   Eosinophils Relative 0 0 - 5 %   Eosinophils Absolute 0.0 0.0 - 0.7 K/uL   Basophils Relative 0 0 - 1 %   Basophils Absolute 0.0 0.0 - 0.1 K/uL  I-Stat CG4 Lactic Acid, ED  (not at  [facility])      Status: Abnormal   Collection Time: 07/24/15  5:19 PM  Result Value Ref Range   Lactic Acid, Venous 2.39 (HH) 0.5 - 2.0 mmol/L   Comment NOTIFIED PHYSICIAN     Dg Chest Port 1 View  07/24/2015   CLINICAL DATA:  Prostate biopsy performed yesterday, now with fever.  EXAM: PORTABLE CHEST - 1 VIEW  COMPARISON:  Chest radiograph 07/13/2008.  FINDINGS: Normal cardiomediastinal silhouette. No infiltrates or failure. No effusion or pneumothorax. Negative osseous structures.  IMPRESSION: Stable chest.   Electronically Signed   By: Staci Righter M.D.   On: 07/24/2015 18:13    Review of Systems  Constitutional: Positive for fever, chills and malaise/fatigue.  HENT: Negative.   Eyes: Negative.   Respiratory: Negative.   Cardiovascular: Negative.   Gastrointestinal: Negative.   Genitourinary: Positive for urgency. Negative for flank pain.  Musculoskeletal: Negative.   Skin: Negative.   Neurological: Negative.   Endo/Heme/Allergies: Negative.   Psychiatric/Behavioral: Negative.    Blood pressure 105/66, pulse 99, temperature 100.4 F (38 C), temperature source Oral, resp. rate 16, height _0  (1.778 m), weight 106.595 kg (235 lb), SpO2 98 %. Physical Exam  Constitutional: He appears well-developed and well-nourished.  Family at bedside  HENT:  Head: Normocephalic.  Eyes: Pupils are equal, round, and reactive to light.  Neck: Normal range of motion.  Cardiovascular:  Regular tachycardia  Respiratory: Effort normal.  GI: Soft.  Genitourinary: Penis normal.  NO SP TTP or distension  Musculoskeletal: Normal range of motion.  Neurological: He is alert.  Skin: Skin is warm. He is diaphoretic.  Psychiatric: He has a normal mood and affect. His behavior is normal. Judgment and thought content normal.    Assessment/Plan:  1 - Sepsis after Prostate Biopsy - agree with aggressive fluid resusication, monitoring, and double-coverage non-fluorquinolone ABX (Zosyn + Gent) empiric awaiting  further CX data from [place] obtained today.  No furhter CX data from our office to guide therapy.  No urinary retention clinically and favor no GU manipulation (catheters) if possible as may exacerbate.   CT pelvis tonight for baseline to r/o large or gas-containing fluid collections, and none noted.   2 - Low Risk Prostate  Cancer - further management pending most recent biopsy path.  Greatly appreciate hospitalist and ICU team comangement. Will follow daily and make Dr. Louis Meckel aware of pt's admission on Monday AM.    [facility], [place] 07/24/2015, 7:36 PM

## 2015-07-24 NOTE — H&P (Signed)
[facility] History and Physical  Craig Butler WHQ:[id number] DOB: 11-07-1956 DOA: 07/24/2015  Referring physician: ED physician PCP:  Melinda Crutch, MD  Specialists:   Chief Complaint: fever and chills, dysuria, rectal pain  HPI: Craig Butler is a 59 y.o. male with PMH GERD, migraine headache, depression, anxiety, who presents with fever, chills, dysuria, rectal pain  Patient reports that he had prostate biopsy yesterday, and developed a fever, chills today. He also has dysuria, burning on urination, and increased urinary frequency. He has rectal pain and pain over suprapubic area. He feels nauseated, but no vomiting or diarrhea. Patient does not have chest pain, cough, shortness of breath, rashes, unilateral weakness.  In ED, patient was found to have elevated lactate 2.39, temperature 100.4, tachycardia, WBC 7.3, positive urinalysis, acute renal injury. X-rays negative for acute seminoma and history CT abdomen/pelvis showed moderate prostate enlargement is noted consistent with a history of prostate carcinoma, no hematoma or abnormal fluid collection is noted in the pelvis, no acute abnormality is noted. Patient is admitted to inpatient for further evaluation and treatment. Urology was consulted and Dr.  Tresa Moore saw pt.  Where does patient live?   At home   Can patient participate in ADLs?  Yes   Review of Systems:   General: no fevers, chills, no changes in body weight, has poor appetite, has fatigue HEENT: no blurry vision, hearing changes or sore throat Pulm: no dyspnea, coughing, wheezing CV: no chest pain, palpitations Abd: has nausea, no vomiting, has abdominal pain, no diarrhea, constipation GU: has dysuria, burning on urination, increased urinary frequency, hematuria  Ext: no leg edema Neuro: no unilateral weakness, numbness, or tingling, no vision change or hearing loss Skin: no rash MSK: No muscle spasm, no deformity, no limitation of range of movement in  spin Heme: No easy bruising.  Travel history: No recent long distant travel.  Allergy: No Known Allergies  Past Medical History  Diagnosis Date  . Acid reflux   . Migraine   . Depression   . Anxiety     Past Surgical History  Procedure Laterality Date  . Biopsy prostate      Social History:  reports that he has quit smoking. He does not have any smokeless tobacco history on file. He reports that he drinks alcohol. His drug history is not on file.  Family History:  Family History  Problem Relation Age of Onset  . Cancer Father   . Cancer Brother      Prior to Admission medications   Medication Sig Start Date End Date Taking? Authorizing Provider  aspirin 81 MG tablet Take 81 mg by mouth daily.    Yes Historical Provider, MD  buPROPion (WELLBUTRIN XL) 300 MG 24 hr tablet Take 300 mg by mouth daily.   Yes Historical Provider, MD  calcium carbonate (TUMS - DOSED IN MG ELEMENTAL CALCIUM) 500 MG chewable tablet Chew 1 tablet by mouth at bedtime.   Yes Historical Provider, MD  diazepam (VALIUM) 10 MG tablet Take 10 mg by mouth once. For procedure.   Yes Historical Provider, MD  fluticasone (FLONASE) 50 MCG/ACT nasal spray Place 2 sprays into both nostrils daily.   Yes Historical Provider, MD  levofloxacin (LEVAQUIN) 500 MG tablet Take 1 tablet by mouth daily. 07/21/15  Yes Historical Provider, MD  omeprazole (PRILOSEC) 20 MG capsule Take 20 mg by mouth daily.   Yes Historical Provider, MD  vitamin E 100 UNIT capsule Take 100 Units by mouth every morning.  Yes Historical Provider, MD  cephALEXin (KEFLEX) 500 MG capsule Take 1 capsule (500 mg total) by mouth 4 (four) times daily. Patient not taking: Reported on 07/24/2015 12/09/13   Virgel Manifold, MD  oxyCODONE (ROXICODONE) 5 MG immediate release tablet Take 1-2 tablets (5-10 mg total) by mouth every 4 (four) hours as needed for severe pain. Patient not taking: Reported on 07/24/2015 12/09/13   Virgel Manifold, MD    Physical Exam: Filed  Vitals:   07/24/15 1848 07/24/15 1900 07/24/15 1915 07/24/15 1924  BP: 89/56 102/57 89/49 105/66  Pulse: 103 102 96 99  Temp:      TempSrc:      Resp: 16 19 16 16   Height:      Weight:      SpO2: 95% 96% 97% 98%   General: Not in acute distress HEENT:       Eyes: PERRL, EOMI, no scleral icterus.       ENT: No discharge from the ears and nose, no pharynx injection, no tonsillar enlargement.        Neck: No JVD, no bruit, no mass felt. Heme: No neck lymph node enlargement. Cardiac: S1/S2, RRR, tachycardia, No murmurs, No gallops or rubs. Pulm:  No rales, wheezing, rhonchi or rubs. Abd: Soft, nondistended, tenderness over suprapubic area, no rebound pain, no organomegaly, BS present. Ext: No pitting leg edema bilaterally. 2+DP/PT pulse bilaterally. Musculoskeletal: No joint deformities, No joint redness or warmth, no limitation of ROM in spin. Skin: No rashes.  Neuro: Alert, oriented X3, cranial nerves II-XII grossly intact, muscle strength 5/5 in all extremities, sensation to light touch intact.  Psych: Patient is not psychotic, no suicidal or hemocidal ideation.  Labs on Admission:  Basic Metabolic Panel:  Recent Labs Lab 07/24/15 1703  NA 136  K 3.9  CL 105  CO2 22  GLUCOSE 117*  BUN 18  CREATININE 1.26*  CALCIUM 9.2   Liver Function Tests:  Recent Labs Lab 07/24/15 1703  AST 23  ALT 24  ALKPHOS 79  BILITOT 1.3*  PROT 6.8  ALBUMIN 4.2   No results for input(s): LIPASE, AMYLASE in the last 168 hours. No results for input(s): AMMONIA in the last 168 hours. CBC:  Recent Labs Lab 07/24/15 1703  WBC 7.3  NEUTROABS 7.0  HGB 15.9  HCT 46.8  MCV 91.4  PLT 169   Cardiac Enzymes: No results for input(s): CKTOTAL, CKMB, CKMBINDEX, TROPONINI in the last 168 hours.  BNP (last 3 results) No results for input(s): BNP in the last 8760 hours.  ProBNP (last 3 results) No results for input(s): PROBNP in the last 8760 hours.  CBG: No results for input(s):  GLUCAP in the last 168 hours.  Radiological Exams on Admission: Ct Pelvis W/o & W Cm  07/24/2015   CLINICAL DATA:  Severe lower abdominal pain after prostate biopsy yesterday. History of prostate cancer.  EXAM: CT PELVIS WITH AND WITHOUT CONTRAST  TECHNIQUE: Multidetector CT imaging of the pelvis was performed following the standard protocol before and following the bolus administration of intravenous contrast.  CONTRAST:  134mL OMNIPAQUE IOHEXOL 300 MG/ML  SOLN  COMPARISON:  None.  FINDINGS: No significant osseous abnormality is noted. There is no evidence of bowel obstruction. No abnormal fluid collection is noted. Urinary bladder appears normal. No significant adenopathy is noted. Appendix appears normal. Moderately enlarged prostate gland is noted consistent with a history of prostatic carcinoma. No hematoma is noted.  IMPRESSION: Moderate prostate enlargement is noted consistent with a history  of prostate carcinoma. No hematoma or abnormal fluid collection is noted in the pelvis. No acute abnormality is noted.   Electronically Signed   By: Marijo Conception, M.D.   On: 07/24/2015 20:49   Dg Chest Port 1 View  07/24/2015   CLINICAL DATA:  Prostate biopsy performed yesterday, now with fever.  EXAM: PORTABLE CHEST - 1 VIEW  COMPARISON:  Chest radiograph 07/13/2008.  FINDINGS: Normal cardiomediastinal silhouette. No infiltrates or failure. No effusion or pneumothorax. Negative osseous structures.  IMPRESSION: Stable chest.   Electronically Signed   By: Staci Righter M.D.   On: 07/24/2015 18:13    EKG: Not done in ED, will get one.   Assessment/Plan Principal Problem:   Sepsis Active Problems:   Acid reflux   UTI (lower urinary tract infection)   Depression   Anxiety   AKI (acute kidney injury)  Sepsis after Prostate Biopsy secondary to UTI: Patient is septic on admission with fever, elevated lactate, tachycardia. Initial blood pressure is soft at 89/56, which improved to SBP>100 after NS bolus.  PCCM was initially consulted and Dr. Joya Gaskins thought pt could be admitted to SDU by [place] and they will monitor pt. urologist was also consulted, Dr. Tresa Moore saw patient, recommended double coverage with Zosyn and gentamicin. Due to AKI, gentamicin is changed to vancomycin after discussed with Dr. Tresa Moore.  -  Admit to SUD -  Appreciate Dr. Tresa Moore and PCCM consultation, with follow-up recommendations. - IV Zosyn and vancomycin per pharmacy - Follow up results of urine and blood cx and amend antibiotic regimen if needed per sensitivity results - prn Zofran for nausea - prn percocet and morphine for pain (put holding parameter for holding morphine if SBP<100) -will get Procalcitonin and trend lactic acid levels per sepsis protocol. -IVF: 2.5L of NS bolus in ED, followed by 100 cc/h  Low Risk Prostate Cancer per Dr. Tresa Moore:   - further management pending most recent biopsy path.  GERD: -Protonix  AKI: Likely due to prerenal secondary to dehydration and continuation and UTI. No hydronephrosis by CT abdomen/pelvis - IVF as above - Check FeNa - Follow up renal function by BMP  Depression and anxiety: Stable, no suicidal or homicidal ideations. He is not on medications at home. -OBSERVE closely   DVT ppx: SCD Code Status: Full code Family Communication:     Yes, patient's wife and daughter at bed side Disposition Plan: Admit to inpatient   Date of Service 07/24/2015    Ivor Costa [facility] Pager [phone]  If 7PM-7AM, please contact night-coverage [email or web address] Password [facility] 07/24/2015, 9:53 PM

## 2015-07-24 NOTE — ED Provider Notes (Addendum)
CSN: [id number]     Arrival date & time 07/24/15  73 History   First MD Initiated Contact with Patient 07/24/15 1639     No chief complaint on file.    (Consider location/radiation/quality/duration/timing/severity/associated sxs/prior Treatment) HPI 59 year old male with a history of prostate cancer presents today with fever, chills, and pain after a prostate biopsy done as an outpatient yesterday. Symptoms began today. He feels generally weak and nauseated. He is complaining of severe rectal pain with lower abdominal pain. Prostate biopsy performed yesterday op by Dr. Louis Meckel.  Past Medical History  Diagnosis Date  . Acid reflux   . Migraine    No past surgical history on file. Family History  Problem Relation Age of Onset  . Cancer Father   . Cancer Brother    Social History  Substance Use Topics  . Smoking status: Former Research scientist (life sciences)  . Smokeless tobacco: Not on file  . Alcohol Use: Yes    Review of Systems  All other systems reviewed and are negative.     Allergies  Review of patient's allergies indicates no known allergies.  Home Medications   Prior to Admission medications   Medication Sig Start Date End Date Taking? Authorizing Provider  aspirin 81 MG tablet Take 81 mg by mouth daily.    Yes Historical Provider, MD  buPROPion (WELLBUTRIN XL) 300 MG 24 hr tablet Take 300 mg by mouth daily.   Yes Historical Provider, MD  calcium carbonate (TUMS - DOSED IN MG ELEMENTAL CALCIUM) 500 MG chewable tablet Chew 1 tablet by mouth at bedtime.   Yes Historical Provider, MD  diazepam (VALIUM) 10 MG tablet Take 10 mg by mouth once. For procedure.   Yes Historical Provider, MD  fluticasone (FLONASE) 50 MCG/ACT nasal spray Place 2 sprays into both nostrils daily.   Yes Historical Provider, MD  levofloxacin (LEVAQUIN) 500 MG tablet Take 1 tablet by mouth daily. 07/21/15  Yes Historical Provider, MD  omeprazole (PRILOSEC) 20 MG capsule Take 20 mg by mouth daily.   Yes Historical  Provider, MD  vitamin E 100 UNIT capsule Take 100 Units by mouth every morning.   Yes Historical Provider, MD  cephALEXin (KEFLEX) 500 MG capsule Take 1 capsule (500 mg total) by mouth 4 (four) times daily. Patient not taking: Reported on 07/24/2015 12/09/13   Virgel Manifold, MD  oxyCODONE (ROXICODONE) 5 MG immediate release tablet Take 1-2 tablets (5-10 mg total) by mouth every 4 (four) hours as needed for severe pain. Patient not taking: Reported on 07/24/2015 12/09/13   Virgel Manifold, MD   BP 118/79 mmHg  Pulse 125  Temp(Src) 100.4 F (38 C) (Oral)  Resp 16  Ht 5\' 10"  (1.778 m)  Wt 235 lb (106.595 kg)  BMI 33.72 kg/m2  SpO2 94% Physical Exam  Constitutional: He is oriented to person, place, and time. He appears well-developed and well-nourished.  HENT:  Head: Normocephalic and atraumatic.  Right Ear: External ear normal.  Left Ear: External ear normal.  Nose: Nose normal.  Mouth/Throat: Oropharynx is clear and moist.  Eyes: Conjunctivae and EOM are normal. Pupils are equal, round, and reactive to light.  Neck: Normal range of motion. Neck supple.  Cardiovascular: Normal heart sounds and intact distal pulses.  Tachycardia present.   Pulmonary/Chest: Effort normal and breath sounds normal. No respiratory distress. He has no wheezes. He exhibits no tenderness.  Abdominal: Soft. Bowel sounds are normal. He exhibits no distension and no mass. There is tenderness. There is no guarding.  Musculoskeletal: Normal range of motion.  Neurological: He is alert and oriented to person, place, and time. He has normal reflexes. He exhibits normal muscle tone. Coordination normal.  Skin: Skin is warm and dry.  Psychiatric: He has a normal mood and affect. His behavior is normal. Judgment and thought content normal.  Nursing note and vitals reviewed.   ED Course  Procedures (including critical care time) Labs Review Labs Reviewed  COMPREHENSIVE METABOLIC PANEL - Abnormal; Notable for the  following:    Glucose, Bld 117 (*)    Creatinine, Ser 1.26 (*)    Total Bilirubin 1.3 (*)    All other components within normal limits  CBC WITH DIFFERENTIAL/PLATELET - Abnormal; Notable for the following:    Neutrophils Relative % 96 (*)    Lymphocytes Relative 3 (*)    Lymphs Abs 0.2 (*)    Monocytes Relative 1 (*)    Monocytes Absolute 0.0 (*)    All other components within normal limits  URINALYSIS, ROUTINE W REFLEX MICROSCOPIC (NOT AT [facility]) - Abnormal; Notable for the following:    APPearance CLOUDY (*)    Hgb urine dipstick TRACE (*)    Leukocytes, UA MODERATE (*)    All other components within normal limits  URINE MICROSCOPIC-ADD ON - Abnormal; Notable for the following:    Bacteria, UA MANY (*)    All other components within normal limits  I-STAT CG4 LACTIC ACID, ED - Abnormal; Notable for the following:    Lactic Acid, Venous 2.39 (*)    All other components within normal limits  CULTURE, BLOOD (ROUTINE X 2)  CULTURE, BLOOD (ROUTINE X 2)  URINE CULTURE  I-STAT CG4 LACTIC ACID, ED    Imaging Review Dg Chest Port 1 View  07/24/2015   CLINICAL DATA:  Prostate biopsy performed yesterday, now with fever.  EXAM: PORTABLE CHEST - 1 VIEW  COMPARISON:  Chest radiograph 07/13/2008.  FINDINGS: Normal cardiomediastinal silhouette. No infiltrates or failure. No effusion or pneumothorax. Negative osseous structures.  IMPRESSION: Stable chest.   Electronically Signed   By: Staci Righter M.D.   On: 07/24/2015 18:13   I have personally reviewed and evaluated these images and lab results as part of my medical decision-making.   EKG Interpretation None      MDM   Final diagnoses:  UTI (lower urinary tract infection)  H/O prostate biopsy    6:42 PM Patient is received 1 L normal saline. His blood pressure is 90/50 and recheck 93/43. He continues tachycardic to about 120. Initial lactic acid is 2.39. Patient has had the weight-based fluid bolus ordered per sepsis order set.  Urology and critical care patient. I discussed results with the patient and family. Voice understanding.  Patient care discussed with Dr. Joya Gaskins. Patient will have remainder of fluid bolus given and repeat lactic acid done. Urology is to be consulted by me. Dr. Joya Gaskins will follow up with having the patient is either to critical care or hospitalist.  Discussed with Dr. Louis Meckel on call for urology. He advises to add gentamicin to the Zosyn. He also requests a CT of the pelvis with and without contrast. This has been ordered.  IV fluids continued to infuse. Last blood pressure systolic blood pressure at 105/66 heart rate 99.8:05 PM 8:45 PM Discussed with Dr. Joya Gaskins. Patient will be admitted to hospitalist service. Dr. Joya Gaskins spoke with Dr. Isa Rankin, MD 07/24/15 2671  Pattricia Boss, MD 07/24/15 2115

## 2015-07-24 NOTE — Progress Notes (Signed)
ANTIBIOTIC CONSULT NOTE - INITIAL  Pharmacy Consult for Vancomycin Indication: UTI/Sepsis  No Known Allergies  Patient Measurements: Height: 5\' 10"  (177.8 cm) Weight: 235 lb (106.595 kg) IBW/kg (Calculated) : 73 Adjusted Body Weight: 86 kg  Vital Signs: Temp: 100.4 F (38 C) (09/10 1643) Temp Source: Oral (09/10 1643) BP: 105/66 mmHg (09/10 1924) Pulse Rate: 99 (09/10 1924) Intake/Output from previous day:   Intake/Output from this shift: Total I/O In: 2000 [I.V.:2000] Out: -   Labs:  Recent Labs  07/24/15 1703  WBC 7.3  HGB 15.9  PLT 169  CREATININE 1.26*   Estimated Creatinine Clearance: 79 mL/min (by C-G formula based on Cr of 1.26). No results for input(s): VANCOTROUGH, VANCOPEAK, VANCORANDOM, GENTTROUGH, GENTPEAK, GENTRANDOM, TOBRATROUGH, TOBRAPEAK, TOBRARND, AMIKACINPEAK, AMIKACINTROU, AMIKACIN in the last 72 hours.   Microbiology: No results found for this or any previous visit (from the past 720 hour(s)).  Medical History: Past Medical History  Diagnosis Date  . Acid reflux   . Migraine   . Depression   . Anxiety     Medications:  Scheduled:  . aspirin  81 mg Oral Daily  . calcium carbonate  1 tablet Oral QHS  . [START ON 07/25/2015] fluticasone  2 spray Each Nare Daily  . pantoprazole  40 mg Oral Daily  . sodium chloride  3 mL Intravenous Q12H  . vitamin E  100 Units Oral q morning - 10a   Infusions:  . sodium chloride    . gentamicin    . [START ON 07/25/2015] piperacillin-tazobactam (ZOSYN)  IV    . sodium chloride     Assessment:  59 year old male known to pharmacy from dosing of Zosyn earlier this evening  Pharmacy consulted to dose Vancomycin for treatment of UTI/Sepsis in patient s/p transrectal prostate biopsy.  CrCl (n)  ~ 65 ml/min  Blood and urine cultures ordered  Goal of Therapy:  Vancomycin Trough level = 15-20 mcg/ml  Plan:  Measure antibiotic drug levels at steady state Follow up culture results   Gentamicin  previously ordered d/c'ed (no doses charted as given)  Vancomycin 2250mg  IV x 1 loading dose (~ 21 mg/kg) followed by 750mg  IV q12h  Messiyah Waterson, Toribio Harbour, PharmD 07/24/2015,9:27 PM

## 2015-07-24 NOTE — ED Notes (Addendum)
Pt comes to ED today with post-surgical perineal pain from prostate biopsy and fever since noon today.  No c/o N/V/D.  Pt had biopsy of prostate yesterday and had pain through yesterday evening.  Pt stated pain wore off today but returned around noon with chills.  Pt family member obtained a thermometer and took pt temp and by 4 pm it measured at 101 degrees F.  Pt was brought by family member to this facility.

## 2015-07-24 NOTE — Progress Notes (Signed)
ANTIBIOTIC CONSULT NOTE - INITIAL  Pharmacy Consult for zosyn Indication: rule out sepsis  No Known Allergies  Patient Measurements: Height: 5\' 10"  (177.8 cm) Weight: 235 lb (106.595 kg) IBW/kg (Calculated) : 73  Vital Signs: Temp: 100.4 F (38 C) (09/10 1643) Temp Source: Oral (09/10 1643) BP: 105/66 mmHg (09/10 1924) Pulse Rate: 99 (09/10 1924) Intake/Output from previous day:   Intake/Output from this shift:    Labs:  Recent Labs  07/24/15 1703  WBC 7.3  HGB 15.9  PLT 169  CREATININE 1.26*   Estimated Creatinine Clearance: 79 mL/min (by C-G formula based on Cr of 1.26). No results for input(s): VANCOTROUGH, VANCOPEAK, VANCORANDOM, GENTTROUGH, GENTPEAK, GENTRANDOM, TOBRATROUGH, TOBRAPEAK, TOBRARND, AMIKACINPEAK, AMIKACINTROU, AMIKACIN in the last 72 hours.   Microbiology: No results found for this or any previous visit (from the past 720 hour(s)).  Medical History: Past Medical History  Diagnosis Date  . Acid reflux   . Migraine    Assessment: Patient is a 59 y.o s/p transrectal prostate biopsy on 9/9.  He presented to the ED on 9/10 with c/o fever and hypotension.  LA elevated at 2.39.  Zosyn and gentamicin started in the ED for sepsis.  Plan:  - zosyn 3.375gm IV x1 over 30 minutes given at [place] in the ED. - start zosyn 3.375 gm IV q8h (infuse over 4 hours) - gentamicin 430 mg IV x1 per MD (~5mg /kg per MD)  Devin Going, Rydge Texidor P 07/24/2015,7:58 PM

## 2015-07-24 NOTE — Progress Notes (Addendum)
eLink Physician-Brief Progress Note Patient Name: Craig Butler DOB: [birth year] MRN: [id number]   Date of Service  07/24/2015  HPI/Events of Note  Asked by EDP to admit this septic pt s/p prostate bx   BP low , lactate 2.3  Getting 3L of IVF now and has had abx and cultures  eICU Interventions  Will repeat 2nd lactic acid at 8pm ,if rising or unchanged ccm will admit, i will call [facility] to alert them to the patient. If lactate improving and bp ok, will ask for SDU admission per [facility].     Intervention Category Major Interventions: Sepsis - evaluation and management  Asencion Noble 07/24/2015, 7:19 PM

## 2015-07-25 ENCOUNTER — Inpatient Hospital Stay (HOSPITAL_COMMUNITY): Payer: Managed Care, Other (non HMO)

## 2015-07-25 ENCOUNTER — Encounter (HOSPITAL_COMMUNITY): Payer: Self-pay | Admitting: *Deleted

## 2015-07-25 DIAGNOSIS — A419 Sepsis, unspecified organism: Secondary | ICD-10-CM

## 2015-07-25 LAB — BLOOD GAS, ARTERIAL
ACID-BASE DEFICIT: 5.6 mmol/L — AB (ref 0.0–2.0)
Bicarbonate: 18.1 mEq/L — ABNORMAL LOW (ref 20.0–24.0)
DRAWN BY: 43752
O2 Content: 3 L/min
O2 SAT: 92.5 %
PATIENT TEMPERATURE: 98.6
PO2 ART: 57.9 mmHg — AB (ref 80.0–100.0)
TCO2: 19 mmol/L (ref 0–100)
pCO2 arterial: 28.4 mmHg — ABNORMAL LOW (ref 35.0–45.0)
pH, Arterial: 7.42 (ref 7.350–7.450)

## 2015-07-25 LAB — MAGNESIUM: Magnesium: 1.4 mg/dL — ABNORMAL LOW (ref 1.7–2.4)

## 2015-07-25 LAB — PHOSPHORUS: Phosphorus: 2.7 mg/dL (ref 2.5–4.6)

## 2015-07-25 LAB — CBC
HCT: 39.1 % (ref 39.0–52.0)
HCT: 31 % — ABNORMAL LOW (ref 39.0–52.0)
Hemoglobin: 13.1 g/dL (ref 13.0–17.0)
Hemoglobin: 10.6 g/dL — ABNORMAL LOW (ref 13.0–17.0)
MCH: 31.3 pg (ref 26.0–34.0)
MCH: 32.5 pg (ref 26.0–34.0)
MCHC: 33.5 g/dL (ref 30.0–36.0)
MCHC: 34.2 g/dL (ref 30.0–36.0)
MCV: 95.1 fL (ref 78.0–100.0)
MCV: 93.5 fL (ref 78.0–100.0)
PLATELETS: 131 10*3/uL — AB (ref 150–400)
PLATELETS: 196 10*3/uL (ref 150–400)
RBC: 4.18 MIL/uL — ABNORMAL LOW (ref 4.22–5.81)
RBC: 3.26 MIL/uL — ABNORMAL LOW (ref 4.22–5.81)
RDW: 14.8 % (ref 11.5–15.5)
RDW: 13.3 % (ref 11.5–15.5)
WBC: 13.9 10*3/uL — AB (ref 4.0–10.5)
WBC: 13.4 10*3/uL — ABNORMAL HIGH (ref 4.0–10.5)

## 2015-07-25 LAB — BASIC METABOLIC PANEL
ANION GAP: 5 (ref 5–15)
BUN: 11 mg/dL (ref 6–20)
CALCIUM: 7.4 mg/dL — AB (ref 8.9–10.3)
CO2: 22 mmol/L (ref 22–32)
Chloride: 115 mmol/L — ABNORMAL HIGH (ref 101–111)
Creatinine, Ser: 1.35 mg/dL — ABNORMAL HIGH (ref 0.61–1.24)
GFR, EST NON AFRICAN AMERICAN: 57 mL/min — AB (ref >60)
GLUCOSE: 111 mg/dL — AB (ref 65–99)
Potassium: 4.9 mmol/L (ref 3.5–5.1)
Sodium: 142 mmol/L (ref 135–145)

## 2015-07-25 LAB — GLUCOSE, CAPILLARY
GLUCOSE-CAPILLARY: 102 mg/dL — AB (ref 65–99)
GLUCOSE-CAPILLARY: 103 mg/dL — AB (ref 65–99)

## 2015-07-25 LAB — MRSA PCR SCREENING: MRSA BY PCR: NEGATIVE

## 2015-07-25 LAB — CREATININE, URINE, RANDOM: Creatinine, Urine: 105.45 mg/dL

## 2015-07-25 LAB — HIV ANTIBODY (ROUTINE TESTING W REFLEX): HIV Screen 4th Generation wRfx: NONREACTIVE

## 2015-07-25 LAB — SODIUM, URINE, RANDOM: Sodium, Ur: 74 mmol/L

## 2015-07-25 LAB — LACTIC ACID, PLASMA: LACTIC ACID, VENOUS: 1.3 mmol/L (ref 0.5–2.0)

## 2015-07-25 LAB — GENTAMICIN LEVEL, RANDOM: Gentamicin Rm: <0.5 ug/mL

## 2015-07-25 MED ORDER — SODIUM CHLORIDE 0.9 % IV BOLUS (SEPSIS)
1000.0000 mL | INTRAVENOUS | Status: AC
  Administered 2015-07-25 (×3): 1000 mL via INTRAVENOUS

## 2015-07-25 MED ORDER — MAGNESIUM SULFATE 2 GM/50ML IV SOLN
2.0000 g | Freq: Once | INTRAVENOUS | Status: AC
  Administered 2015-07-25: 2 g via INTRAVENOUS
  Filled 2015-07-25: qty 50

## 2015-07-25 MED ORDER — FUROSEMIDE 10 MG/ML IJ SOLN
40.0000 mg | Freq: Once | INTRAMUSCULAR | Status: AC
  Administered 2015-07-25: 40 mg via INTRAVENOUS
  Filled 2015-07-25: qty 4

## 2015-07-25 MED ORDER — SODIUM CHLORIDE 0.9 % IV SOLN: 250.0000 mL | INTRAVENOUS | Status: DC | PRN

## 2015-07-25 MED ORDER — MEPERIDINE HCL 25 MG/ML IJ SOLN
12.5000 mg | Freq: Once | INTRAMUSCULAR | Status: AC
  Administered 2015-07-25: 12.5 mg via INTRAVENOUS
  Filled 2015-07-25: qty 1

## 2015-07-25 MED ORDER — HEPARIN SODIUM (PORCINE) 5000 UNIT/ML IJ SOLN
5000.0000 [IU] | Freq: Three times a day (TID) | INTRAMUSCULAR | Status: DC
Start: 2015-07-25 — End: 2015-07-26
  Administered 2015-07-25 – 2015-07-26 (×5): 5000 [IU] via SUBCUTANEOUS
  Filled 2015-07-25 (×5): qty 1

## 2015-07-25 MED ORDER — SODIUM CHLORIDE 0.9 % IV BOLUS (SEPSIS): 2000.0000 mL | Freq: Once | INTRAVENOUS | Status: AC

## 2015-07-25 MED ORDER — MORPHINE SULFATE (PF) 2 MG/ML IV SOLN
2.0000 mg | Freq: Once | INTRAVENOUS | Status: AC
  Administered 2015-07-25: 2 mg via INTRAVENOUS

## 2015-07-25 NOTE — H&P (Addendum)
PULMONARY / CRITICAL CARE MEDICINE   Name: Craig Butler MRN: [id number] DOB: Sep 09, 1956    ADMISSION DATE:  07/24/2015  CHIEF COMPLAINT:  Rectal pain  HISTORY OF PRESENT ILLNESS:   59 y.o. male with PMH GERD, migraine headache, depression, anxiety, who presents with fever, chills, dysuria, rectal pain. He underwent prostate biopsy yesterday, and developed a fever, chills today. He also has dysuria, burning on urination, and increased urinary frequency. He has rectal pain and pain over suprapubic area. He feels nauseated, but no vomiting or diarrhea. Patient does not have chest pain, cough, shortness of breath, rashes, unilateral weakness.  He also reported some rectal bleeding after the biopsy but this improved over the last day.  He was admitted to the floor initially but became hypotensive and was transferred to [facility] for ICU bed.  PAST MEDICAL HISTORY :   has a past medical history of Acid reflux; Migraine; Depression; and Anxiety.  has past surgical history that includes Biopsy prostate. Prior to Admission medications   Medication Sig Start Date End Date Taking? Authorizing Provider  aspirin 81 MG tablet Take 81 mg by mouth daily.    Yes Historical Provider, MD  buPROPion (WELLBUTRIN XL) 300 MG 24 hr tablet Take 300 mg by mouth daily.   Yes Historical Provider, MD  calcium carbonate (TUMS - DOSED IN MG ELEMENTAL CALCIUM) 500 MG chewable tablet Chew 1 tablet by mouth at bedtime.   Yes Historical Provider, MD  diazepam (VALIUM) 10 MG tablet Take 10 mg by mouth once. For procedure.   Yes Historical Provider, MD  fluticasone (FLONASE) 50 MCG/ACT nasal spray Place 2 sprays into both nostrils daily.   Yes Historical Provider, MD  levofloxacin (LEVAQUIN) 500 MG tablet Take 1 tablet by mouth daily. 07/21/15  Yes Historical Provider, MD  omeprazole (PRILOSEC) 20 MG capsule Take 20 mg by mouth daily.   Yes Historical Provider, MD  vitamin E 100 UNIT capsule Take 100 Units by mouth every  morning.   Yes Historical Provider, MD  cephALEXin (KEFLEX) 500 MG capsule Take 1 capsule (500 mg total) by mouth 4 (four) times daily. Patient not taking: Reported on 07/24/2015 12/09/13   Virgel Manifold, MD  oxyCODONE (ROXICODONE) 5 MG immediate release tablet Take 1-2 tablets (5-10 mg total) by mouth every 4 (four) hours as needed for severe pain. Patient not taking: Reported on 07/24/2015 12/09/13   Virgel Manifold, MD   No Known Allergies  FAMILY HISTORY:  has no family status information on file.  SOCIAL HISTORY:  reports that he has quit smoking. He does not have any smokeless tobacco history on file. He reports that he drinks alcohol.  REVIEW OF SYSTEMS:  Negative other then HPI  SUBJECTIVE:   VITAL SIGNS: Temp:  [98.4 F (36.9 C)-100.4 F (38 C)] 98.4 F (36.9 C) (09/11 0147) Pulse Rate:  [85-134] 103 (09/11 0147) Resp:  [14-25] 21 (09/11 0147) BP: (86-118)/(47-79) 105/50 mmHg (09/11 0147) SpO2:  [86 %-100 %] 95 % (09/11 0147) Weight:  [106.595 kg (235 lb)] 106.595 kg (235 lb) (09/10 1721) HEMODYNAMICS:   VENTILATOR SETTINGS:   INTAKE / OUTPUT:  Intake/Output Summary (Last 24 hours) at 07/25/15 3500 Last data filed at 07/24/15 2325  Gross per 24 hour  Intake   5003 ml  Output      0 ml  Net   5003 ml    PHYSICAL EXAMINATION: General:  Mild distress due to rigors Neuro:  CN II-XII intact, +rigors HEENT:  NCAT Cardiovascular:  Tachycardic, no m/r/g Lungs:  CTA b/l no w/r/r Abdomen:  Soft, + suprapubuc TTP.  Normal Bowel sounds.  No CVAT. Rectal: palpable nodule on prostate, no fluctuance of prostate, no puss or blood on exam  LABS:  CBC  Recent Labs Lab 07/24/15 1703  WBC 7.3  HGB 15.9  HCT 46.8  PLT 169   Coag's  Recent Labs Lab 07/24/15 2132  APTT 31  INR 1.23   BMET  Recent Labs Lab 07/24/15 1703  NA 136  K 3.9  CL 105  CO2 22  BUN 18  CREATININE 1.26*  GLUCOSE 117*   Electrolytes  Recent Labs Lab 07/24/15 1703  CALCIUM 9.2    Sepsis Markers  Recent Labs Lab 07/24/15 2025 07/24/15 2132 07/25/15 0049  LATICACIDVEN 2.39* 1.6 1.3  PROCALCITON  --  47.01  --    ABG No results for input(s): PHART, PCO2ART, PO2ART in the last 168 hours. Liver Enzymes  Recent Labs Lab 07/24/15 1703  AST 23  ALT 24  ALKPHOS 79  BILITOT 1.3*  ALBUMIN 4.2   Cardiac Enzymes No results for input(s): TROPONINI, PROBNP in the last 168 hours. Glucose No results for input(s): GLUCAP in the last 168 hours.  Imaging Ct Pelvis W/o & W Cm  07/24/2015   CLINICAL DATA:  Severe lower abdominal pain after prostate biopsy yesterday. History of prostate cancer.  EXAM: CT PELVIS WITH AND WITHOUT CONTRAST  TECHNIQUE: Multidetector CT imaging of the pelvis was performed following the standard protocol before and following the bolus administration of intravenous contrast.  CONTRAST:  141mL OMNIPAQUE IOHEXOL 300 MG/ML  SOLN  COMPARISON:  None.  FINDINGS: No significant osseous abnormality is noted. There is no evidence of bowel obstruction. No abnormal fluid collection is noted. Urinary bladder appears normal. No significant adenopathy is noted. Appendix appears normal. Moderately enlarged prostate gland is noted consistent with a history of prostatic carcinoma. No hematoma is noted.  IMPRESSION: Moderate prostate enlargement is noted consistent with a history of prostate carcinoma. No hematoma or abnormal fluid collection is noted in the pelvis. No acute abnormality is noted.   Electronically Signed   By: Marijo Conception, M.D.   On: 07/24/2015 20:49   Dg Chest Port 1 View  07/24/2015   CLINICAL DATA:  Prostate biopsy performed yesterday, now with fever.  EXAM: PORTABLE CHEST - 1 VIEW  COMPARISON:  Chest radiograph 07/13/2008.  FINDINGS: Normal cardiomediastinal silhouette. No infiltrates or failure. No effusion or pneumothorax. Negative osseous structures.  IMPRESSION: Stable chest.   Electronically Signed   By: Staci Righter M.D.   On: 07/24/2015  18:13     ASSESSMENT / PLAN:  32M with severe sepsis and resulting hypotension after biopsy of prostate.  CARDIOVASCULAR A: Hypotension 2/2 sepsis.  Responsive to volume resucitation, lactate now cleared P:   - treatment of underlying sepsis  - Further volume resucitation PRN  - trend lactate  - Trend UOP  - no indication for CVC now  - no evidence of shock  RENAL A:  Mild AKI likely 2/2 hypotension.  PVR 24cc.  No e/o urinary retention. P:    - Trend Scr  INFECTIOUS A:  Sepsis likely 2/2 prostate biopsy with likely acute bacterial prostatitis from seeding of colonic bacteria to urinary tract vs bacteremia.  No CT e/o free fluid in abdomen or pelvis.  Mild enlargement,  P:   Procal: 47 BCx2 07/24/2015 UC 07/24/2015 Abx: Vanc/zosyn, start date9/08/2015, - urology consult  GU: A: s/p prostate  biopsy with pelvic pain and prostatitis.  Nothing on exam to suggest bleeding or rectal/pelvic abscess. P:   - Zosyn with reasonable prostate penetration  - oral oxycodone PRN  - Morphine for breakthrough pain  - meperidine given x1 for rigors  Total critical care time: 30 min  Critical care time was exclusive of separately billable procedures and treating other patients.  Critical care was necessary to treat or prevent imminent or life-threatening deterioration.  Critical care was time spent personally by me on the following activities: development of treatment plan with patient and/or surrogate as well as nursing, discussions with consultants, evaluation of patient's response to treatment, examination of patient, obtaining history from patient or surrogate, ordering and performing treatments and interventions, ordering and review of laboratory studies, ordering and review of radiographic studies, pulse oximetry and re-evaluation of patient's condition.   Meribeth Mattes, DO., MS [facility]   Pulmonary and [place] Pager: [phone]  07/25/2015, 2:13 AM

## 2015-07-25 NOTE — Progress Notes (Signed)
Pt voided 150cc of urine. Bladder scan obtained. 24cc of volume left. Will continue to monitor.

## 2015-07-25 NOTE — Progress Notes (Signed)
12.5mg (1/65ml) of demerol given for pain and chills. 12.5mg  (1/66ml) wasted in sink witnessed by Angelica Chessman.

## 2015-07-25 NOTE — Progress Notes (Signed)
[facility] Progress Note Patient Name: Craig Butler DOB: 08-18-1956 MRN: [id number]   Date of Service  07/25/2015  HPI/Events of Note  Blood cultures X 2 positive for GNR's (both bottles). NO ID or sensitivity currently available. Patient currently on Vancomycin and Zosyn. Zosyn should provide adequate GNR coverage.   eICU Interventions  Continue current management.      Intervention Category Major Interventions: Infection - evaluation and management  Sommer,Steven Eugene 07/25/2015, 6:50 AM

## 2015-07-25 NOTE — Progress Notes (Signed)
Positive blood cultures. Gram negatice rods in both sets. MD made aware. Will continue to monitor.

## 2015-07-25 NOTE — Progress Notes (Signed)
I pulled 1mg  of Morphine in a 2mg  vial from pyxis. MD at bedside and order to give the full dose of 2mg  prior to doing rectal exam. Morphine not wasted in pyxis see MAR.

## 2015-07-25 NOTE — Progress Notes (Signed)
Patient was seen and examined.  Was seen by PCCM today, [facility] will assume care 07/26/2015.  Sepsis secondary to GNR bacteremia -Likely complicated by prostate biopsy -Continue vancomycin, Zosyn was discontinued -Urine culture pending  Prostate cancer, low risk -Urology consulted and following -Most recent PSA 4.8  Transient hypotension -Likely secondary to sepsis, resolved with IV fluid resuscitation  Lactic acidosis -Resolved  Depression/anxiety -On no home medications  Acute kidney injury -Likely secondary to dehydration -CT abdomen and pelvis mentioned no hydronephrosis  GERD -Continue PPI and TUMS  Time spent: 20 minutes  Robb Sibal D.O. [facility] Pager [phone]  If 7PM-7AM, please contact night-coverage [email or web address] Password [email or web address] 07/25/2015, 12:30 PM

## 2015-07-25 NOTE — Progress Notes (Signed)
eLink Physician-Brief Progress Note Patient Name: Craig Butler DOB: 10-17-1956 MRN: [id number]   Date of Service  07/25/2015  HPI/Events of Note  Nurse notes restlessness, hypoxemia requiring 3L Power 7 L positive yesterday  eICU Interventions  CXR now ABG now Lasix now     Intervention Category Major Interventions: Respiratory failure - evaluation and management  MCQUAID, DOUGLAS 07/25/2015, 9:00 PM

## 2015-07-25 NOTE — Progress Notes (Signed)
PCCM PROGRESS NOTE  ADMISSION DATE: 07/24/2015 REFERRING PROVIDER: [facility]  CC: Rectal pain  SUBJECTIVE: Not as much pain.  Denies dyspnea.  Tolerating diet.  OBJECTIVE: Temp:  [98.4 F (36.9 C)-100.4 F (38 C)] 99.9 F (37.7 C) (09/11 0745) Pulse Rate:  [85-134] 108 (09/11 0800) Resp:  [14-33] 28 (09/11 0800) BP: (86-126)/(47-79) 122/69 mmHg (09/11 0800) SpO2:  [86 %-100 %] 94 % (09/11 0800) Weight:  [235 lb (106.595 kg)-246 lb 0.5 oz (111.6 kg)] 246 lb 0.5 oz (111.6 kg) (09/11 0416) General: sitting up in bed HEENT: no sinus tenderness Cardiac: regular Chest: no wheeze Abd: soft, non tender Ext: no edema Neuro: normal strength Skin: no rashes   CMP Latest Ref Rng 07/25/2015 07/24/2015 12/09/2013  Glucose 65 - 99 mg/dL 111(H) 117(H) 111(H)  BUN 6 - 20 mg/dL 11 18 16   Creatinine 0.61 - 1.24 mg/dL 1.35(H) 1.26(H) 1.09  Sodium 135 - 145 mmol/L 142 136 138  Potassium 3.5 - 5.1 mmol/L 4.9 3.9 4.4  Chloride 101 - 111 mmol/L 115(H) 105 104  CO2 22 - 32 mmol/L 22 22 22   Calcium 8.9 - 10.3 mg/dL 7.4(L) 9.2 8.0(L)  Total Protein 6.5 - 8.1 g/dL - 6.8 6.2  Total Bilirubin 0.3 - 1.2 mg/dL - 1.3(H) 1.4(H)  Alkaline Phos 38 - 126 U/L - 79 83  AST 15 - 41 U/L - 23 129(H)  ALT 17 - 63 U/L - 24 160(H)     CBC Latest Ref Rng 07/25/2015 07/25/2015 07/24/2015  WBC 4.0 - 10.5 K/uL 13.9(H) 13.4(H) 7.3  Hemoglobin 13.0 - 17.0 g/dL 10.6(L) 13.1 15.9  Hematocrit 39.0 - 52.0 % 31.0(L) 39.1 46.8  Platelets 150 - 400 K/uL 196 131(L) 169     Ct Pelvis W/o & W Cm  07/24/2015   CLINICAL DATA:  Severe lower abdominal pain after prostate biopsy yesterday. History of prostate cancer.  EXAM: CT PELVIS WITH AND WITHOUT CONTRAST  TECHNIQUE: Multidetector CT imaging of the pelvis was performed following the standard protocol before and following the bolus administration of intravenous contrast.  CONTRAST:  17mL OMNIPAQUE IOHEXOL 300 MG/ML  SOLN  COMPARISON:  None.  FINDINGS: No significant osseous  abnormality is noted. There is no evidence of bowel obstruction. No abnormal fluid collection is noted. Urinary bladder appears normal. No significant adenopathy is noted. Appendix appears normal. Moderately enlarged prostate gland is noted consistent with a history of prostatic carcinoma. No hematoma is noted.  IMPRESSION: Moderate prostate enlargement is noted consistent with a history of prostate carcinoma. No hematoma or abnormal fluid collection is noted in the pelvis. No acute abnormality is noted.   Electronically Signed   By: Marijo Conception, M.D.   On: 07/24/2015 20:49   Dg Chest Port 1 View  07/24/2015   CLINICAL DATA:  Prostate biopsy performed yesterday, now with fever.  EXAM: PORTABLE CHEST - 1 VIEW  COMPARISON:  Chest radiograph 07/13/2008.  FINDINGS: Normal cardiomediastinal silhouette. No infiltrates or failure. No effusion or pneumothorax. Negative osseous structures.  IMPRESSION: Stable chest.   Electronically Signed   By: Staci Righter M.D.   On: 07/24/2015 18:13     CULTURES: 9/10 Urine >> 9/10 Blood >> GNR >>  STUDIES: 9/10 CT pelvis >> mod prostate enlargement   EVENTS: 9/11 Admit >> to Floor  DISCUSSION: 59 yo male s/p transrectal prostate bx on 07/24/15 presented with fever, chills, dysuria, rectal pain.  He has elevated procalcitonin and blood cultures positive for GNR.  His lactic acid was elevated,  but has since cleared.  Blood pressure improved with IV fluid only.  ASSESSMENT/PLAN:  Sepsis with GNR bacteremia after prostate biopsy. Plan: - day 2 of zosyn - d/c vancomycin - continue IV fluids - f/u procalcitonin, blood/urine cx  Prostate cancer. Plan: - f/u with urology  Hx of anxiety, depression, migraines, chronic back pain. Plan: - prn pain meds  Hx of GERD. Plan: - protonix  Will transfer to non tele floor bed.  Will ask [facility] to assume care from 9/12 and PCCM off.  Chesley Mires, MD [facility]/Critical Care 07/25/2015, 9:11 AM Pager:   [phone] After 3pm call: [phone]

## 2015-07-25 NOTE — Progress Notes (Signed)
Subjective:  1 - Sepsis after Prostate Biopsy - fevers, tachycardia and hypotension by ER eval of subjective fevers 1 day after transrectal prostate biopsy. Had Levaquin peri-procedurally. Most recent UA pre-procedure w/o infectious parameters. CT pelvis from ER w/o hematoma / fluid collections / peraprostatic gas.   Now on empiric Zosyn + Vanc ( we suggested gent + zosyn but pharmacist felt too nephrotoxic combiation) [place] 9/10 - GNR both bottles UCX 9/10 - pending  2 - Low Risk Prostate Cancer - Gleason 6 <5% two cores by biopsy 11/2014 on eval elevated and rising PSA. Initially on active surveillance with first surveillance biopsy path pending 07/2015. Most recent PSA 4.8.  Today "Clair Gulling" is seen in f/u above. BCX with GNR as expected.   Objective: Vital signs in last 24 hours: Temp:  [98.4 F (36.9 C)-100.4 F (38 C)] 99.3 F (37.4 C) (09/11 0416) Pulse Rate:  [85-134] 102 (09/11 0600) Resp:  [14-33] 33 (09/11 0600) BP: (86-121)/(47-79) 121/56 mmHg (09/11 0600) SpO2:  [86 %-100 %] 93 % (09/11 0600) Weight:  [106.595 kg (235 lb)-111.6 kg (246 lb 0.5 oz)] 111.6 kg (246 lb 0.5 oz) (09/11 0416) Last BM Date: 07/23/15  Intake/Output from previous day: 09/10 0701 - 09/11 0700 In: 7509.7 [P.O.:240; I.V.:5003; IV Piggyback:2266.7] Out: 650 [Urine:650] Intake/Output this shift:    General appearance: alert, cooperative, appears stated age and "feels better than yesterday" Eyes: negative Nose: Nares normal. Septum midline. Mucosa normal. No drainage or sinus tenderness. Throat: lips, mucosa, and tongue normal; teeth and gums normal Neck: supple, symmetrical, trachea midline Back: symmetric, no curvature. ROM normal. No CVA tenderness. Resp: non-labored on NCO2 Cardio: regular tachycardia by bedside monitor and radial pulse GI: soft, non-tender; bowel sounds normal; no masses,  no organomegaly Extremities: extremities normal, atraumatic, no cyanosis or edema Pulses: 2+ and  symmetric Neurologic: Grossly normal  NO SP TTP.  Lab Results:   Recent Labs  07/24/15 1703 07/25/15 0327  WBC 7.3 13.9*  13.4*  HGB 15.9 10.6*  13.1  HCT 46.8 31.0*  39.1  PLT 169 196  131*   BMET  Recent Labs  07/24/15 1703 07/25/15 0327  NA 136 142  K 3.9 4.9  CL 105 115*  CO2 22 22  GLUCOSE 117* 111*  BUN 18 11  CREATININE 1.26* 1.35*  CALCIUM 9.2 7.4*   PT/INR  Recent Labs  07/24/15 2132  LABPROT 15.6*  INR 1.23   ABG No results for input(s): PHART, HCO3 in the last 72 hours.  Invalid input(s): PCO2, PO2  Studies/Results: Ct Pelvis W/o & W Cm  07/24/2015   CLINICAL DATA:  Severe lower abdominal pain after prostate biopsy yesterday. History of prostate cancer.  EXAM: CT PELVIS WITH AND WITHOUT CONTRAST  TECHNIQUE: Multidetector CT imaging of the pelvis was performed following the standard protocol before and following the bolus administration of intravenous contrast.  CONTRAST:  165mL OMNIPAQUE IOHEXOL 300 MG/ML  SOLN  COMPARISON:  None.  FINDINGS: No significant osseous abnormality is noted. There is no evidence of bowel obstruction. No abnormal fluid collection is noted. Urinary bladder appears normal. No significant adenopathy is noted. Appendix appears normal. Moderately enlarged prostate gland is noted consistent with a history of prostatic carcinoma. No hematoma is noted.  IMPRESSION: Moderate prostate enlargement is noted consistent with a history of prostate carcinoma. No hematoma or abnormal fluid collection is noted in the pelvis. No acute abnormality is noted.   Electronically Signed   By: Marijo Conception, M.D.  On: 07/24/2015 20:49   Dg Chest Port 1 View  07/24/2015   CLINICAL DATA:  Prostate biopsy performed yesterday, now with fever.  EXAM: PORTABLE CHEST - 1 VIEW  COMPARISON:  Chest radiograph 07/13/2008.  FINDINGS: Normal cardiomediastinal silhouette. No infiltrates or failure. No effusion or pneumothorax. Negative osseous structures.   IMPRESSION: Stable chest.   Electronically Signed   By: Staci Righter M.D.   On: 07/24/2015 18:13    Anti-infectives: Anti-infectives    Start     Dose/Rate Route Frequency Ordered Stop   07/25/15 1100  vancomycin (VANCOCIN) IVPB 750 mg/150 ml premix     750 mg 150 mL/hr over 60 Minutes Intravenous Every 12 hours 07/24/15 2149     07/25/15 0100  piperacillin-tazobactam (ZOSYN) IVPB 3.375 g     3.375 g 12.5 mL/hr over 240 Minutes Intravenous Every 8 hours 07/24/15 2003     07/24/15 2200  gentamicin (GARAMYCIN) 430 mg in dextrose 5 % 100 mL IVPB  Status:  Discontinued     5 mg/kg  86.4 kg (Adjusted) 110.8 mL/hr over 60 Minutes Intravenous Every 24 hours 07/24/15 2125 07/24/15 2130   07/24/15 2200  gentamicin (GARAMYCIN) 600 mg in dextrose 5 % 100 mL IVPB  Status:  Discontinued     7 mg/kg  86.4 kg (Adjusted) 115 mL/hr over 60 Minutes Intravenous Every 24 hours 07/24/15 2131 07/24/15 2132   07/24/15 2200  vancomycin (VANCOCIN) 2,250 mg in sodium chloride 0.9 % 500 mL IVPB     2,250 mg 250 mL/hr over 120 Minutes Intravenous  Once 07/24/15 2149 07/25/15 0101   07/24/15 1945  gentamicin (GARAMYCIN) 430 mg in dextrose 5 % 50 mL IVPB  Status:  Discontinued     5 mg/kg  86.4 kg (Adjusted) 121.5 mL/hr over 30 Minutes Intravenous  Once 07/24/15 1935 07/24/15 2124   07/24/15 1700  piperacillin-tazobactam (ZOSYN) IVPB 3.375 g  Status:  Discontinued     3.375 g 100 mL/hr over 30 Minutes Intravenous 3 times per day 07/24/15 1647 07/24/15 2003      Assessment/Plan:  1 - Sepsis after Prostate Biopsy - agree with aggressive fluid resusication, monitoring, and double-coverage non-fluorquinolone ABX (Zosyn + Gent preferred but current Zosyn + Vanc ceratinly reasonable as long as responding clinically) empiric awaiting further CX data from [place]. No furhter CX data from our office to guide therapy. No urinary retention clinically and favor no GU manipulation (catheters) if possible as may  exacerbate.   2 - Low Risk Prostate Cancer - further management pending most recent biopsy path.  Continue to  appreciate hospitalist and ICU team comangement. Will follow daily and make Dr. Louis Meckel aware of pt's admission on Monday AM.   [facility], Breelyn Icard 07/25/2015

## 2015-07-26 ENCOUNTER — Inpatient Hospital Stay (HOSPITAL_COMMUNITY): Payer: Managed Care, Other (non HMO)

## 2015-07-26 DIAGNOSIS — R338 Other retention of urine: Secondary | ICD-10-CM

## 2015-07-26 DIAGNOSIS — K5900 Constipation, unspecified: Secondary | ICD-10-CM

## 2015-07-26 DIAGNOSIS — R319 Hematuria, unspecified: Secondary | ICD-10-CM

## 2015-07-26 LAB — URINE CULTURE

## 2015-07-26 LAB — BASIC METABOLIC PANEL
ANION GAP: 5 (ref 5–15)
BUN: 12 mg/dL (ref 6–20)
CALCIUM: 7.7 mg/dL — AB (ref 8.9–10.3)
CO2: 23 mmol/L (ref 22–32)
Chloride: 106 mmol/L (ref 101–111)
Creatinine, Ser: 1.43 mg/dL — ABNORMAL HIGH (ref 0.61–1.24)
GFR, EST NON AFRICAN AMERICAN: 53 mL/min — AB (ref >60)
Glucose, Bld: 137 mg/dL — ABNORMAL HIGH (ref 65–99)
Potassium: 4.1 mmol/L (ref 3.5–5.1)
SODIUM: 134 mmol/L — AB (ref 135–145)

## 2015-07-26 LAB — CBC
HEMATOCRIT: 38.9 % — AB (ref 39.0–52.0)
Hemoglobin: 12.5 g/dL — ABNORMAL LOW (ref 13.0–17.0)
MCH: 29.9 pg (ref 26.0–34.0)
MCHC: 32.1 g/dL (ref 30.0–36.0)
MCV: 93.1 fL (ref 78.0–100.0)
Platelets: 105 10*3/uL — ABNORMAL LOW (ref 150–400)
RBC: 4.18 MIL/uL — ABNORMAL LOW (ref 4.22–5.81)
RDW: 14.1 % (ref 11.5–15.5)
WBC: 9.3 10*3/uL (ref 4.0–10.5)

## 2015-07-26 MED ORDER — PHENAZOPYRIDINE HCL 100 MG PO TABS
100.0000 mg | ORAL_TABLET | Freq: Three times a day (TID) | ORAL | Status: DC
  Administered 2015-07-26 (×2): 100 mg via ORAL
  Filled 2015-07-26 (×7): qty 1

## 2015-07-26 MED ORDER — SENNA 8.6 MG PO TABS
2.0000 | ORAL_TABLET | Freq: Every day | ORAL | Status: DC
  Administered 2015-07-26 – 2015-07-29 (×4): 17.2 mg via ORAL
  Filled 2015-07-26 (×4): qty 2

## 2015-07-26 MED ORDER — SODIUM CHLORIDE 0.9 % IV SOLN
1.0000 g | INTRAVENOUS | Status: DC
  Administered 2015-07-26 – 2015-07-27 (×2): 1 g via INTRAVENOUS
  Filled 2015-07-26 (×2): qty 1

## 2015-07-26 MED ORDER — POLYETHYLENE GLYCOL 3350 17 G PO PACK
17.0000 g | PACK | Freq: Every day | ORAL | Status: DC
  Administered 2015-07-26: 17 g via ORAL
  Filled 2015-07-26 (×2): qty 1

## 2015-07-26 MED ORDER — OXYCODONE HCL 5 MG PO TABS
10.0000 mg | ORAL_TABLET | ORAL | Status: DC | PRN
  Administered 2015-07-26 – 2015-07-30 (×9): 10 mg via ORAL
  Filled 2015-07-26 (×8): qty 2

## 2015-07-26 MED ORDER — ACETAMINOPHEN 500 MG PO TABS
1000.0000 mg | ORAL_TABLET | Freq: Three times a day (TID) | ORAL | Status: DC
  Administered 2015-07-26 – 2015-07-29 (×11): 1000 mg via ORAL
  Filled 2015-07-26 (×11): qty 2

## 2015-07-26 MED ORDER — DOCUSATE SODIUM 100 MG PO CAPS
100.0000 mg | ORAL_CAPSULE | Freq: Two times a day (BID) | ORAL | Status: DC
  Administered 2015-07-26 – 2015-07-27 (×3): 100 mg via ORAL
  Filled 2015-07-26 (×3): qty 1

## 2015-07-26 MED ORDER — MAGNESIUM CITRATE PO SOLN
1.0000 | Freq: Once | ORAL | Status: AC
  Administered 2015-07-26: 1 via ORAL
  Filled 2015-07-26: qty 296

## 2015-07-26 MED ORDER — BISACODYL 10 MG RE SUPP
10.0000 mg | Freq: Once | RECTAL | Status: AC
  Administered 2015-07-26: 10 mg via RECTAL
  Filled 2015-07-26: qty 1

## 2015-07-26 MED ORDER — ZOLPIDEM TARTRATE 5 MG PO TABS
5.0000 mg | ORAL_TABLET | Freq: Every evening | ORAL | Status: DC | PRN
  Administered 2015-07-27 (×2): 5 mg via ORAL
  Filled 2015-07-26 (×2): qty 1

## 2015-07-26 MED ORDER — POLYETHYLENE GLYCOL 3350 17 G PO PACK: 17.0000 g | PACK | Freq: Two times a day (BID) | ORAL | Status: DC

## 2015-07-26 MED ORDER — TAMSULOSIN HCL 0.4 MG PO CAPS
0.4000 mg | ORAL_CAPSULE | Freq: Every day | ORAL | Status: DC
  Administered 2015-07-26 – 2015-07-29 (×3): 0.4 mg via ORAL
  Filled 2015-07-26 (×4): qty 1

## 2015-07-26 MED ORDER — BISACODYL 5 MG PO TBEC
10.0000 mg | DELAYED_RELEASE_TABLET | Freq: Once | ORAL | Status: AC
  Administered 2015-07-26: 10 mg via ORAL
  Filled 2015-07-26: qty 2

## 2015-07-26 NOTE — Progress Notes (Signed)
ANTIBIOTIC CONSULT NOTE - INITIAL  Pharmacy Consult for ertapenem Indication: ESBL EColi  No Known Allergies  Patient Measurements: Height: 5\' 9"  (175.3 cm) Weight: 250 lb 7.1 oz (113.6 kg) IBW/kg (Calculated) : 70.7   Vital Signs: Temp: 98.1 F (36.7 C) (09/12 1411) Temp Source: Oral (09/12 1411) BP: 125/71 mmHg (09/12 1411) Pulse Rate: 83 (09/12 1411) Intake/Output from previous day: 09/11 0701 - 09/12 0700 In: 2540 [P.O.:240; I.V.:2200; IV Piggyback:100] Out: 1690 [Urine:1690] Intake/Output from this shift: Total I/O In: 480 [P.O.:480] Out: -   Labs:  Recent Labs  07/24/15 1703 07/25/15 0040 07/25/15 0327 07/26/15 0359  WBC 7.3  --  13.9*  13.4* 9.3  HGB 15.9  --  10.6*  13.1 12.5*  PLT 169  --  196  131* 105*  LABCREA  --  105.45  --   --   CREATININE 1.26*  --  1.35* 1.43*   Estimated Creatinine Clearance: 70.9 mL/min (by C-G formula based on Cr of 1.43).  Recent Labs  07/25/15 1120  GENTRANDOM <0.5     Microbiology: Recent Results (from the past 720 hour(s))  Urine culture     Status: None   Collection Time: 07/24/15  4:38 PM  Result Value Ref Range Status   Specimen Description URINE, CLEAN CATCH  Final   Special Requests NONE  Final   Culture   Final    >=100,000 COLONIES/mL ESCHERICHIA COLI Confirmed Extended Spectrum Beta-Lactamase Producer (ESBL) Performed at [facility]    Report Status 07/26/2015 FINAL  Final   Organism ID, Bacteria ESCHERICHIA COLI  Final      Susceptibility   Escherichia coli - MIC*    AMPICILLIN >=32 RESISTANT Resistant     CEFAZOLIN >=64 RESISTANT Resistant     CEFTRIAXONE >=64 RESISTANT Resistant     CIPROFLOXACIN >=4 RESISTANT Resistant     GENTAMICIN <=1 SENSITIVE Sensitive     IMIPENEM <=0.25 SENSITIVE Sensitive     NITROFURANTOIN <=16 SENSITIVE Sensitive     TRIMETH/SULFA >=320 RESISTANT Resistant     AMPICILLIN/SULBACTAM 8 SENSITIVE Sensitive     PIP/TAZO <=4 SENSITIVE Sensitive     *  >=100,000 COLONIES/mL ESCHERICHIA COLI  Blood Culture (routine x 2)     Status: None (Preliminary result)   Collection Time: 07/24/15  4:46 PM  Result Value Ref Range Status   Specimen Description BLOOD RIGHT ANTECUBITAL  Final   Special Requests BOTTLES DRAWN AEROBIC AND ANAEROBIC 5 CC EACH  Final   Culture  Setup Time   Final    GRAM NEGATIVE RODS IN BOTH AEROBIC AND ANAEROBIC BOTTLES CRITICAL RESULT CALLED TO, READ BACK BY AND VERIFIED WITH: S STOWE@0639  07/25/15 M KELLY    Culture   Final    GRAM NEGATIVE RODS Performed at [facility]    Report Status PENDING  Incomplete  Blood Culture (routine x 2)     Status: None (Preliminary result)   Collection Time: 07/24/15  5:19 PM  Result Value Ref Range Status   Specimen Description BLOOD RIGHT FOREARM  Final   Special Requests BOTTLES DRAWN AEROBIC AND ANAEROBIC 5CC  Final   Culture  Setup Time   Final    GRAM NEGATIVE RODS IN BOTH AEROBIC AND ANAEROBIC BOTTLES CRITICAL RESULT CALLED TO, READ BACK BY AND VERIFIED WITHValentino Nose @ 6010 07/25/15 MKELLY    Culture   Final    GRAM NEGATIVE RODS Performed at [facility]    Report Status PENDING  Incomplete  MRSA PCR Screening     Status: None   Collection Time: 07/25/15  1:47 AM  Result Value Ref Range Status   MRSA by PCR NEGATIVE NEGATIVE Final    Comment:        The GeneXpert MRSA Assay (FDA approved for NASAL specimens only), is one component of a comprehensive MRSA colonization surveillance program. It is not intended to diagnose MRSA infection nor to guide or monitor treatment for MRSA infections.    Assessment: 38 YOM who presented after a prostate cancer biopsy with fever and chills, found to have sepsis and was in the ICU. Now on the regular floor. GNR found in urine and blood, so vancomycin was stopped yesterday. Urine cultures resulted today with ESBL EColi. Still 2/2 GNR in blood- likely the same organism. To switch to carbapenem therapy after  discussion with Dr. Sheran Fava.  SCr 1.43, AKI likely from dehydration (baseline ~1), CrCl ~70-108mL/min.   WBC down to 9.3, still spiking fevers- Tmax 101.1.  Goal of Therapy:  eradication of infection  Plan:  -ertapenem 1g IV q24h -follow up blood cultures for final speciation -follow clinical progression, renal function, LOT  Dozier Berkovich D. Makell Drohan, PharmD, BCPS Clinical Pharmacist Pager: [phone] 07/26/2015 2:32 PM

## 2015-07-26 NOTE — Care Management Note (Signed)
Case Management Note  Patient Details  Name: Craig Butler MRN: [id number] Date of Birth: 10/29/1956  Subjective/Objective:                    Action/Plan:  UR completed  Expected Discharge Date:                  Expected Discharge Plan:  Home/Self Care  In-House Referral:     Discharge planning Services     Post Acute Care Choice:    Choice offered to:     DME Arranged:    DME Agency:     HH Arranged:    [place] Agency:     Status of Service:  In process, will continue to follow  Medicare Important Message Given:    Date Medicare IM Given:    Medicare IM give by:    Date Additional Medicare IM Given:    Additional Medicare Important Message give by:     If discussed at [place] of Stay Meetings, dates discussed:    Additional Comments:  Marilu Favre, RN 07/26/2015, 11:54 AM

## 2015-07-26 NOTE — Progress Notes (Addendum)
[facility] PROGRESS NOTE  DERALD LORGE OZH:[id number] DOB: 03/18/1956 DOA: 07/24/2015 PCP:  Melinda Crutch, MD  Brief Summary  59 y.o. male with PMH GERD, migraine headache, depression, anxiety, who presented with fever, chills, dysuria, rectal pain.  He underwent prostate biopsy yesterday, and developed a fever, chills today.  He felt nauseated, but denied vomiting or diarrhea.  He also reported some rectal bleeding after the biopsy but this improved over the last day.  He was admitted to the floor initially but became hypotensive and was transferred to [facility] for ICU bed.  Assessment/Plan  Severe sepsis secondary to ESBL E. Coli, blood pressure improved with IVF -Likely complicated by prostate biopsy - d/c zosyn - start ertapenem day 1 of antibiotics today for 14 day course - will eventually need PICC line for ongoing therapy but will need negative blood culture for at least 24 to 48 hours.   Acute respiratory failure with hypoxia secondary to probable acute diastolic heart failure and mild ARDS from sepsis -  Given lasix overnight -  No lasix for now given urinary obstruction -  Wean O2 as tolerated  Hematuria with acute urinary retention -  Hold aspirin and heparin -  SCDs -  Urology aware -  Try to avoid foley if possible as long as patient is voiding some -  Started flomax  Constipation, not passing gas, probably secondary to narcotics but may also have ileus from severe sepsis -  Start miralax, colace, senna, and give 1 dose of bisacodyl PO:  No relief -  Magnesium citrate >> no relief -  Bisacodyl suppository and KUB ordered  Prostate cancer, low risk -Urology consulted and following -Most recent PSA 4.8  Lactic acidosis -Resolved  Depression/anxiety -On no home medications  Acute kidney injury, creatinine continuing to rise - Likely secondary to dehydration - CT abdomen and pelvis mentioned no hydronephrosis, but now having acute urinary retention  -   Avoid NSAIDS  GERD -Continue PPI and TUMS  Diet:  Healthy heart Access:  PIV IVF:  yes Proph:  SCDs  Code Status: full Family Communication: patient alone Disposition Plan: pending PICC line placement for antibiotics.  Not a candidate for fosfomycin because p/w severe sepsis   Consultants:  Urology  Procedures:  CT abd/pelvis  Antibiotics:  Zosyn 9/10 > 9/11  Vanc 9/10 x 1   HPI/Subjective:  Having severe pain with urination, hematuria with clots, difficulty urinating.  Has not had recent BM and feels very constipated.    Objective: Filed Vitals:   07/25/15 2320 07/26/15 0142 07/26/15 0631 07/26/15 1411  BP: 114/60 105/60 110/68 125/71  Pulse: 94 82 72 83  Temp: 98.1 F (36.7 C) 98.1 F (36.7 C) 98.1 F (36.7 C) 98.1 F (36.7 C)  TempSrc: Oral Oral Oral Oral  Resp: 21 20 18 18   Height:      Weight:   113.6 kg (250 lb 7.1 oz)   SpO2: 93% 94% 95% 95%    Intake/Output Summary (Last 24 hours) at 07/26/15 1708 Last data filed at 07/26/15 1616  Gross per 24 hour  Intake   1980 ml  Output   1790 ml  Net    190 ml   Filed Weights   07/24/15 1721 07/25/15 0416 07/26/15 0631  Weight: 106.595 kg (235 lb) 111.6 kg (246 lb 0.5 oz) 113.6 kg (250 lb 7.1 oz)   Body mass index is 36.97 kg/(m^2).  Exam:   General:  Obese male, No acute distress  HEENT:  NCAT, MMM  Cardiovascular:  RRR, nl S1, S2 no mrg, 2+ pulses, warm extremities  Respiratory:  Wheezing bilaterally, no rales or rhonchi, no increased WOB  Abdomen:   NABS, soft, moderately distended, TTP over suprapubic area without rebound or guarding  MSK:   Normal tone and bulk, no LEE  Neuro:  Grossly intact  Data Reviewed: Basic Metabolic Panel:  Recent Labs Lab 07/24/15 1703 07/25/15 0327 07/26/15 0359  NA 136 142 134*  K 3.9 4.9 4.1  CL 105 115* 106  CO2 22 22 23   GLUCOSE 117* 111* 137*  BUN 18 11 12   CREATININE 1.26* 1.35* 1.43*  CALCIUM 9.2 7.4* 7.7*  MG  --  1.4*  --   PHOS  --   2.7  --    Liver Function Tests:  Recent Labs Lab 07/24/15 1703  AST 23  ALT 24  ALKPHOS 79  BILITOT 1.3*  PROT 6.8  ALBUMIN 4.2   No results for input(s): LIPASE, AMYLASE in the last 168 hours. No results for input(s): AMMONIA in the last 168 hours. CBC:  Recent Labs Lab 07/24/15 1703 07/25/15 0327 07/26/15 0359  WBC 7.3 13.9*  13.4* 9.3  NEUTROABS 7.0  --   --   HGB 15.9 10.6*  13.1 12.5*  HCT 46.8 31.0*  39.1 38.9*  MCV 91.4 95.1  93.5 93.1  PLT 169 196  131* 105*    Recent Results (from the past 240 hour(s))  Urine culture     Status: None   Collection Time: 07/24/15  4:38 PM  Result Value Ref Range Status   Specimen Description URINE, CLEAN CATCH  Final   Special Requests NONE  Final   Culture   Final    >=100,000 COLONIES/mL ESCHERICHIA COLI Confirmed Extended Spectrum Beta-Lactamase Producer (ESBL) Performed at [facility]    Report Status 07/26/2015 FINAL  Final   Organism ID, Bacteria ESCHERICHIA COLI  Final      Susceptibility   Escherichia coli - MIC*    AMPICILLIN >=32 RESISTANT Resistant     CEFAZOLIN >=64 RESISTANT Resistant     CEFTRIAXONE >=64 RESISTANT Resistant     CIPROFLOXACIN >=4 RESISTANT Resistant     GENTAMICIN <=1 SENSITIVE Sensitive     IMIPENEM <=0.25 SENSITIVE Sensitive     NITROFURANTOIN <=16 SENSITIVE Sensitive     TRIMETH/SULFA >=320 RESISTANT Resistant     AMPICILLIN/SULBACTAM 8 SENSITIVE Sensitive     PIP/TAZO <=4 SENSITIVE Sensitive     * >=100,000 COLONIES/mL ESCHERICHIA COLI  Blood Culture (routine x 2)     Status: None (Preliminary result)   Collection Time: 07/24/15  4:46 PM  Result Value Ref Range Status   Specimen Description BLOOD RIGHT ANTECUBITAL  Final   Special Requests BOTTLES DRAWN AEROBIC AND ANAEROBIC 5 CC EACH  Final   Culture  Setup Time   Final    GRAM NEGATIVE RODS IN BOTH AEROBIC AND ANAEROBIC BOTTLES CRITICAL RESULT CALLED TO, READ BACK BY AND VERIFIED WITH: S STOWE@0639  07/25/15 M  KELLY    Culture   Final    GRAM NEGATIVE RODS Performed at [facility]    Report Status PENDING  Incomplete  Blood Culture (routine x 2)     Status: None (Preliminary result)   Collection Time: 07/24/15  5:19 PM  Result Value Ref Range Status   Specimen Description BLOOD RIGHT FOREARM  Final   Special Requests BOTTLES DRAWN AEROBIC AND ANAEROBIC 5CC  Final   Culture  Setup Time  Final    GRAM NEGATIVE RODS IN BOTH AEROBIC AND ANAEROBIC BOTTLES CRITICAL RESULT CALLED TO, READ BACK BY AND VERIFIED WITH: S STOWE @ [phone] 07/25/15 MKELLY    Culture   Final    GRAM NEGATIVE RODS Performed at [facility]    Report Status PENDING  Incomplete  MRSA PCR Screening     Status: None   Collection Time: 07/25/15  1:47 AM  Result Value Ref Range Status   MRSA by PCR NEGATIVE NEGATIVE Final    Comment:        The GeneXpert MRSA Assay (FDA approved for NASAL specimens only), is one component of a comprehensive MRSA colonization surveillance program. It is not intended to diagnose MRSA infection nor to guide or monitor treatment for MRSA infections.      Studies: Ct Pelvis W/o & W Cm  07/24/2015   CLINICAL DATA:  Severe lower abdominal pain after prostate biopsy yesterday. History of prostate cancer.  EXAM: CT PELVIS WITH AND WITHOUT CONTRAST  TECHNIQUE: Multidetector CT imaging of the pelvis was performed following the standard protocol before and following the bolus administration of intravenous contrast.  CONTRAST:  127mL OMNIPAQUE IOHEXOL 300 MG/ML  SOLN  COMPARISON:  None.  FINDINGS: No significant osseous abnormality is noted. There is no evidence of bowel obstruction. No abnormal fluid collection is noted. Urinary bladder appears normal. No significant adenopathy is noted. Appendix appears normal. Moderately enlarged prostate gland is noted consistent with a history of prostatic carcinoma. No hematoma is noted.  IMPRESSION: Moderate prostate enlargement is noted  consistent with a history of prostate carcinoma. No hematoma or abnormal fluid collection is noted in the pelvis. No acute abnormality is noted.   Electronically Signed   By: Marijo Conception, M.D.   On: 07/24/2015 20:49   Dg Chest Port 1 View  07/25/2015   CLINICAL DATA:  Acute respiratory failure with hypoxia.  EXAM: PORTABLE CHEST - 1 VIEW  COMPARISON:  Chest radiographs obtained yesterday.  FINDINGS: Lower lung volumes from prior exam. Development of vascular congestion. Right basilar and left perihilar linear atelectasis. No confluent airspace disease. No large pleural effusion or pneumothorax.  IMPRESSION: Lower lung volumes with development of vascular congestion and scattered atelectasis.   Electronically Signed   By: Jeb Levering M.D.   On: 07/25/2015 21:36   Dg Chest Port 1 View  07/24/2015   CLINICAL DATA:  Prostate biopsy performed yesterday, now with fever.  EXAM: PORTABLE CHEST - 1 VIEW  COMPARISON:  Chest radiograph 07/13/2008.  FINDINGS: Normal cardiomediastinal silhouette. No infiltrates or failure. No effusion or pneumothorax. Negative osseous structures.  IMPRESSION: Stable chest.   Electronically Signed   By: Staci Righter M.D.   On: 07/24/2015 18:13    Scheduled Meds: . calcium carbonate  1 tablet Oral QHS  . docusate sodium  100 mg Oral BID  . ertapenem  1 g Intravenous Q24H  . fluticasone  2 spray Each Nare Daily  . pantoprazole  40 mg Oral Daily  . phenazopyridine  100 mg Oral TID WC  . polyethylene glycol  17 g Oral Daily  . senna  2 tablet Oral QHS  . sodium chloride  3 mL Intravenous Q12H  . tamsulosin  0.4 mg Oral Daily  . vitamin E  100 Units Oral q morning - 10a   Continuous Infusions:   Principal Problem:   Sepsis Active Problems:   Acid reflux   UTI (lower urinary tract infection)   Depression  Anxiety   AKI (acute kidney injury)    Time spent: 30 min    Carsen Leaf, [facility] Pager [phone]. If 7PM-7AM, please contact  night-coverage at [email or web address], password [facility] 07/26/2015, 5:08 PM  LOS: 2 days

## 2015-07-26 NOTE — Consult Note (Signed)
36M, patient of mine, with low grade prostate cancer who became bacteremic after a prostate biopsy which he was covered with levoquin for.  He has been in/out of ICU for transient hypotension.  Today he was complaining mostly of dysuria and bladder pain.  He has had no clots in his urine/stool over the last few days.  Denies any chest pain, shortness of breath.  No rectal pain/pressure.  On zosyn currently for Ecoli urine speciman.  Blood culture is no growth.  Filed Vitals:   07/26/15 1411  BP: 125/71  Pulse: 83  Temp: 98.1 F (36.7 C)  Resp: 18   I/O last 3 completed shifts: In: 3020 [P.O.:720; I.V.:2200; IV Piggyback:100] Out: 1990 [Urine:1990]  NAD RRR Non-labored breathing Abdomen is soft Ext sym  Recent Labs  07/24/15 1703 07/25/15 0327 07/26/15 0359  WBC 7.3 13.9*  13.4* 9.3  HGB 15.9 10.6*  13.1 12.5*  HCT 46.8 31.0*  39.1 38.9*    Recent Labs  07/24/15 1703 07/25/15 0327 07/26/15 0359  NA 136 142 134*  K 3.9 4.9 4.1  CL 105 115* 106  CO2 22 22 23   GLUCOSE 117* 111* 137*  BUN 18 11 12   CREATININE 1.26* 1.35* 1.43*  CALCIUM 9.2 7.4* 7.7*    Recent Labs  07/24/15 2132  INR 1.23   No results for input(s): PSA in the last 72 hours. No results for input(s): LABURIN in the last 72 hours. Results for orders placed or performed during the hospital encounter of 07/24/15  Urine culture     Status: None   Collection Time: 07/24/15  4:38 PM  Result Value Ref Range Status   Specimen Description URINE, CLEAN CATCH  Final   Special Requests NONE  Final   Culture   Final    >=100,000 COLONIES/mL ESCHERICHIA COLI Confirmed Extended Spectrum Beta-Lactamase Producer (ESBL) Performed at [facility]    Report Status 07/26/2015 FINAL  Final   Organism ID, Bacteria ESCHERICHIA COLI  Final      Susceptibility   Escherichia coli - MIC*    AMPICILLIN >=32 RESISTANT Resistant     CEFAZOLIN >=64 RESISTANT Resistant     CEFTRIAXONE >=64 RESISTANT  Resistant     CIPROFLOXACIN >=4 RESISTANT Resistant     GENTAMICIN <=1 SENSITIVE Sensitive     IMIPENEM <=0.25 SENSITIVE Sensitive     NITROFURANTOIN <=16 SENSITIVE Sensitive     TRIMETH/SULFA >=320 RESISTANT Resistant     AMPICILLIN/SULBACTAM 8 SENSITIVE Sensitive     PIP/TAZO <=4 SENSITIVE Sensitive     * >=100,000 COLONIES/mL ESCHERICHIA COLI  Blood Culture (routine x 2)     Status: None (Preliminary result)   Collection Time: 07/24/15  4:46 PM  Result Value Ref Range Status   Specimen Description BLOOD RIGHT ANTECUBITAL  Final   Special Requests BOTTLES DRAWN AEROBIC AND ANAEROBIC 5 CC EACH  Final   Culture  Setup Time   Final    GRAM NEGATIVE RODS IN BOTH AEROBIC AND ANAEROBIC BOTTLES CRITICAL RESULT CALLED TO, READ BACK BY AND VERIFIED WITH: S STOWE@0639  07/25/15 M KELLY    Culture   Final    GRAM NEGATIVE RODS Performed at [facility]    Report Status PENDING  Incomplete  Blood Culture (routine x 2)     Status: None (Preliminary result)   Collection Time: 07/24/15  5:19 PM  Result Value Ref Range Status   Specimen Description BLOOD RIGHT FOREARM  Final   Special Requests BOTTLES DRAWN AEROBIC AND  ANAEROBIC 5CC  Final   Culture  Setup Time   Final    GRAM NEGATIVE RODS IN BOTH AEROBIC AND ANAEROBIC BOTTLES CRITICAL RESULT CALLED TO, READ BACK BY AND VERIFIED WITH: S STOWE @ 0165 07/25/15 MKELLY    Culture   Final    GRAM NEGATIVE RODS Performed at [facility]    Report Status PENDING  Incomplete  MRSA PCR Screening     Status: None   Collection Time: 07/25/15  1:47 AM  Result Value Ref Range Status   MRSA by PCR NEGATIVE NEGATIVE Final    Comment:        The GeneXpert MRSA Assay (FDA approved for NASAL specimens only), is one component of a comprehensive MRSA colonization surveillance program. It is not intended to diagnose MRSA infection nor to guide or monitor treatment for MRSA infections.      Imp: Unfortunately 62M who became  bactermic following a prostate biopsy as part of his active surveillance protocol for very low risk prostate cancer.  Appears to be growing ESBL e.coli, which is sensitive to Augmentin.  Rec: Appreciate Hospitalist service taking care of patient.  I would consider consulting ID with regards to abx choice.  Nitrofurantoin is not adequate, but Augmentin may be.  I will continue follow.

## 2015-07-27 DIAGNOSIS — K5901 Slow transit constipation: Secondary | ICD-10-CM

## 2015-07-27 LAB — BASIC METABOLIC PANEL
ANION GAP: 6 (ref 5–15)
BUN: 20 mg/dL (ref 6–20)
CALCIUM: 8.7 mg/dL — AB (ref 8.9–10.3)
CHLORIDE: 111 mmol/L (ref 101–111)
CO2: 22 mmol/L (ref 22–32)
CREATININE: 2.53 mg/dL — AB (ref 0.61–1.24)
GFR calc non Af Amer: 27 mL/min — ABNORMAL LOW (ref >60)
GFR, EST AFRICAN AMERICAN: 31 mL/min — AB (ref >60)
Glucose, Bld: 120 mg/dL — ABNORMAL HIGH (ref 65–99)
Potassium: 3.8 mmol/L (ref 3.5–5.1)
SODIUM: 139 mmol/L (ref 135–145)

## 2015-07-27 LAB — CULTURE, BLOOD (ROUTINE X 2)

## 2015-07-27 LAB — CBC
HEMATOCRIT: 36.1 % — AB (ref 39.0–52.0)
HEMOGLOBIN: 12 g/dL — AB (ref 13.0–17.0)
MCH: 30.2 pg (ref 26.0–34.0)
MCHC: 33.2 g/dL (ref 30.0–36.0)
MCV: 90.7 fL (ref 78.0–100.0)
Platelets: 103 10*3/uL — ABNORMAL LOW (ref 150–400)
RBC: 3.98 MIL/uL — ABNORMAL LOW (ref 4.22–5.81)
RDW: 13.7 % (ref 11.5–15.5)
WBC: 6 10*3/uL (ref 4.0–10.5)

## 2015-07-27 MED ORDER — PEG 3350-KCL-NA BICARB-NACL 420 G PO SOLR
4000.0000 mL | Freq: Once | ORAL | Status: AC
  Administered 2015-07-27: 4000 mL via ORAL
  Filled 2015-07-27: qty 4000

## 2015-07-27 NOTE — Consult Note (Signed)
59M, patient of mine, with low grade prostate cancer who became bacteremic after a prostate biopsy which he was covered with levoquin for.  He has been in/out of ICU for transient hypotension.  Intv: sleeping this AM, finally!  Comfortable.  Has had severe urgency and very small voids/dribbles.  This has been managed with a condom cath.  Still having dysuria.  Filed Vitals:   07/27/15 0451  BP: 128/72  Pulse: 79  Temp: 98.2 F (36.8 C)  Resp: 18   I/O last 3 completed shifts: In: 2220 [P.O.:720; I.V.:1500] Out: 2190 [Urine:2190]  NAD RRR Non-labored breathing Abdomen is soft Ext sym  Recent Labs  07/24/15 1703 07/25/15 0327 07/26/15 0359  WBC 7.3 13.9*  13.4* 9.3  HGB 15.9 10.6*  13.1 12.5*  HCT 46.8 31.0*  39.1 38.9*    Recent Labs  07/24/15 1703 07/25/15 0327 07/26/15 0359  NA 136 142 134*  K 3.9 4.9 4.1  CL 105 115* 106  CO2 22 22 23   GLUCOSE 117* 111* 137*  BUN 18 11 12   CREATININE 1.26* 1.35* 1.43*  CALCIUM 9.2 7.4* 7.7*    Recent Labs  07/24/15 2132  INR 1.23   No results for input(s): PSA in the last 72 hours. No results for input(s): LABURIN in the last 72 hours. Results for orders placed or performed during the hospital encounter of 07/24/15  Urine culture     Status: None   Collection Time: 07/24/15  4:38 PM  Result Value Ref Range Status   Specimen Description URINE, CLEAN CATCH  Final   Special Requests NONE  Final   Culture   Final    >=100,000 COLONIES/mL ESCHERICHIA COLI Confirmed Extended Spectrum Beta-Lactamase Producer (ESBL) Performed at [facility]    Report Status 07/26/2015 FINAL  Final   Organism ID, Bacteria ESCHERICHIA COLI  Final      Susceptibility   Escherichia coli - MIC*    AMPICILLIN >=32 RESISTANT Resistant     CEFAZOLIN >=64 RESISTANT Resistant     CEFTRIAXONE >=64 RESISTANT Resistant     CIPROFLOXACIN >=4 RESISTANT Resistant     GENTAMICIN <=1 SENSITIVE Sensitive     IMIPENEM <=0.25 SENSITIVE  Sensitive     NITROFURANTOIN <=16 SENSITIVE Sensitive     TRIMETH/SULFA >=320 RESISTANT Resistant     AMPICILLIN/SULBACTAM 8 SENSITIVE Sensitive     PIP/TAZO <=4 SENSITIVE Sensitive     * >=100,000 COLONIES/mL ESCHERICHIA COLI  Blood Culture (routine x 2)     Status: None (Preliminary result)   Collection Time: 07/24/15  4:46 PM  Result Value Ref Range Status   Specimen Description BLOOD RIGHT ANTECUBITAL  Final   Special Requests BOTTLES DRAWN AEROBIC AND ANAEROBIC 5 CC EACH  Final   Culture  Setup Time   Final    GRAM NEGATIVE RODS IN BOTH AEROBIC AND ANAEROBIC BOTTLES CRITICAL RESULT CALLED TO, READ BACK BY AND VERIFIED WITH: S STOWE@0639  07/25/15 M KELLY    Culture   Final    GRAM NEGATIVE RODS Performed at [facility]    Report Status PENDING  Incomplete  Blood Culture (routine x 2)     Status: None (Preliminary result)   Collection Time: 07/24/15  5:19 PM  Result Value Ref Range Status   Specimen Description BLOOD RIGHT FOREARM  Final   Special Requests BOTTLES DRAWN AEROBIC AND ANAEROBIC 5CC  Final   Culture  Setup Time   Final    GRAM NEGATIVE RODS IN BOTH AEROBIC AND  ANAEROBIC BOTTLES CRITICAL RESULT CALLED TO, READ BACK BY AND VERIFIED WITH: S STOWE @ [phone] 07/25/15 MKELLY    Culture   Final    GRAM NEGATIVE RODS Performed at [facility]    Report Status PENDING  Incomplete  MRSA PCR Screening     Status: None   Collection Time: 07/25/15  1:47 AM  Result Value Ref Range Status   MRSA by PCR NEGATIVE NEGATIVE Final    Comment:        The GeneXpert MRSA Assay (FDA approved for NASAL specimens only), is one component of a comprehensive MRSA colonization surveillance program. It is not intended to diagnose MRSA infection nor to guide or monitor treatment for MRSA infections.      Imp: Unfortunately 40M who became bactermic following a prostate biopsy as part of his active surveillance protocol for very low risk prostate cancer.  Appears to be  growing ESBL e.coli, which is sensitive to Augmentin.  He has been stable for 48 hrs.  He likely has bacterial prostatitis as well. Prostate biopsy results still pending.   Rec:  Given that he likely has prostatitis, he will need a month of abx.  Augmentin penetrates the prostate well, if this an option for his bacteremia. Will continue to follow

## 2015-07-27 NOTE — Progress Notes (Signed)
[facility] PROGRESS NOTE  TANIA PERROTT DJS:[id number] DOB: [birth year] DOA: 07/24/2015 PCP:  Melinda Crutch, MD  Brief Summary  59 y.o. male with PMH GERD, migraine headache, depression, anxiety, who presented with fever, chills, dysuria, rectal pain.  He underwent prostate biopsy yesterday, and developed a fever, chills today.  He felt nauseated, but denied vomiting or diarrhea.  He also reported some rectal bleeding after the biopsy but this improved over the last day.  He was admitted to the floor initially but became hypotensive and was transferred to [facility] for ICU bed.  9/12:  Started ertapenem for ESBL E. coli 9/13:  AKI  Assessment/Plan  Severe sepsis secondary to ESBL E. Coli, blood pressure improved with IVF.   - Likely complicated by prostate biopsy - continue ertapenem day 2 of antibiotics today for 4 week course of antibiotics (for prostatitis) at the recommendation of Urology - will need PICC line once repeat blood culture is negative for at least 24 hours -  BCx 9/13 pending  Acute respiratory failure with hypoxia secondary to probable acute diastolic heart failure and mild ARDS from sepsis, resolved after 1 dose of lasix.    AKI likely secondary to acute urinary retention, voiding more easily now and still with good uop so I expect this will be better in AM -  Avoid nephrotoxins and renally dose medications  -  Will hold on IVF for now that voiding some and repeat BMP in AM  Hematuria with acute urinary retention, urine clearing and still voiding some -  Continue to hold aspirin and heparin -  SCDs -  Urology aware -  Try to avoid foley if possible as long as patient is voiding some -  Started flomax on 9/12  Constipation, not passing gas, probably secondary to narcotics but may also have ileus from severe sepsis -  Continue miralax, colace, senna, and give 1 dose of bisacodyl PO:  No relief -  Magnesium citrate >> no relief -  Bisacodyl suppository and  KUB ordered -  Relieving constipation will help with urinary retention also  Prostate cancer, low risk -Urology consulted and following -Most recent PSA 4.8  Lactic acidosis -Resolved  Depression/anxiety -On no home medications  GERD -Continue PPI  -  Hold TUMS tonight since this can be constipating also.  Replace with maalox.    Diet:  Healthy heart Access:  PIV IVF:  off Proph:  SCDs  Code Status: full Family Communication: patient alone Disposition Plan: pending PICC line placement for antibiotics.  Not a candidate for fosfomycin because p/w severe sepsis   Consultants:  Urology  Procedures:  CT abd/pelvis  Antibiotics:  Zosyn 9/10 > 9/11  Vanc 9/10 x 1   Ertapenem 9/12 >>  HPI/Subjective:  Less pain with urination, hematuria resolving, easier time urinating. Had a very tiny BM but still feels backed up and needs something to help him go.      Objective: Filed Vitals:   07/26/15 0631 07/26/15 1411 07/26/15 2142 07/27/15 0451  BP: 110/68 125/71 117/81 128/72  Pulse: 72 83 75 79  Temp: 98.1 F (36.7 C) 98.1 F (36.7 C) 98.2 F (36.8 C) 98.2 F (36.8 C)  TempSrc: Oral Oral Oral Oral  Resp: 18 18 18 18   Height:      Weight: 113.6 kg (250 lb 7.1 oz)   117.2 kg (258 lb 6.1 oz)  SpO2: 95% 95% 96% 94%    Intake/Output Summary (Last 24 hours) at 07/27/15 1416 Last data  filed at 07/27/15 1255  Gross per 24 hour  Intake    720 ml  Output   1275 ml  Net   -555 ml   Filed Weights   07/25/15 0416 07/26/15 0631 07/27/15 0451  Weight: 111.6 kg (246 lb 0.5 oz) 113.6 kg (250 lb 7.1 oz) 117.2 kg (258 lb 6.1 oz)   Body mass index is 38.14 kg/(m^2).  Exam:   General:  Obese male, No acute distress  HEENT:  NCAT, MMM  Cardiovascular:  RRR, nl S1, S2 no mrg, 2+ pulses, warm extremities  Respiratory:  CTAB, no increased WOB  Abdomen:   NABS, soft, nondistended, mildly TTP over suprapubic area without rebound or guarding. Bladder still feels  full  MSK:   Normal tone and bulk, trace pitting bilateral LEE  Neuro:  Grossly intact  Data Reviewed: Basic Metabolic Panel:  Recent Labs Lab 07/24/15 1703 07/25/15 0327 07/26/15 0359 07/27/15 0845  NA 136 142 134* 139  K 3.9 4.9 4.1 3.8  CL 105 115* 106 111  CO2 22 22 23 22   GLUCOSE 117* 111* 137* 120*  BUN 18 11 12 20   CREATININE 1.26* 1.35* 1.43* 2.53*  CALCIUM 9.2 7.4* 7.7* 8.7*  MG  --  1.4*  --   --   PHOS  --  2.7  --   --    Liver Function Tests:  Recent Labs Lab 07/24/15 1703  AST 23  ALT 24  ALKPHOS 79  BILITOT 1.3*  PROT 6.8  ALBUMIN 4.2   No results for input(s): LIPASE, AMYLASE in the last 168 hours. No results for input(s): AMMONIA in the last 168 hours. CBC:  Recent Labs Lab 07/24/15 1703 07/25/15 0327 07/26/15 0359 07/27/15 0845  WBC 7.3 13.9*  13.4* 9.3 6.0  NEUTROABS 7.0  --   --   --   HGB 15.9 10.6*  13.1 12.5* 12.0*  HCT 46.8 31.0*  39.1 38.9* 36.1*  MCV 91.4 95.1  93.5 93.1 90.7  PLT 169 196  131* 105* 103*    Recent Results (from the past 240 hour(s))  Urine culture     Status: None   Collection Time: 07/24/15  4:38 PM  Result Value Ref Range Status   Specimen Description URINE, CLEAN CATCH  Final   Special Requests NONE  Final   Culture   Final    >=100,000 COLONIES/mL ESCHERICHIA COLI Confirmed Extended Spectrum Beta-Lactamase Producer (ESBL) Performed at [facility]    Report Status 07/26/2015 FINAL  Final   Organism ID, Bacteria ESCHERICHIA COLI  Final      Susceptibility   Escherichia coli - MIC*    AMPICILLIN >=32 RESISTANT Resistant     CEFAZOLIN >=64 RESISTANT Resistant     CEFTRIAXONE >=64 RESISTANT Resistant     CIPROFLOXACIN >=4 RESISTANT Resistant     GENTAMICIN <=1 SENSITIVE Sensitive     IMIPENEM <=0.25 SENSITIVE Sensitive     NITROFURANTOIN <=16 SENSITIVE Sensitive     TRIMETH/SULFA >=320 RESISTANT Resistant     AMPICILLIN/SULBACTAM 8 SENSITIVE Sensitive     PIP/TAZO <=4 SENSITIVE  Sensitive     * >=100,000 COLONIES/mL ESCHERICHIA COLI  Blood Culture (routine x 2)     Status: None (Preliminary result)   Collection Time: 07/24/15  4:46 PM  Result Value Ref Range Status   Specimen Description BLOOD RIGHT ANTECUBITAL  Final   Special Requests BOTTLES DRAWN AEROBIC AND ANAEROBIC 5 CC EACH  Final   Culture  Setup Time  Final    GRAM NEGATIVE RODS IN BOTH AEROBIC AND ANAEROBIC BOTTLES CRITICAL RESULT CALLED TO, READ BACK BY AND VERIFIED WITH: S STOWE@0639  07/25/15 M KELLY    Culture   Final    GRAM NEGATIVE RODS Performed at [facility]    Report Status PENDING  Incomplete  Blood Culture (routine x 2)     Status: None   Collection Time: 07/24/15  5:19 PM  Result Value Ref Range Status   Specimen Description BLOOD RIGHT FOREARM  Final   Special Requests BOTTLES DRAWN AEROBIC AND ANAEROBIC 5CC  Final   Culture  Setup Time   Final    GRAM NEGATIVE RODS IN BOTH AEROBIC AND ANAEROBIC BOTTLES CRITICAL RESULT CALLED TO, READ BACK BY AND VERIFIED WITH: S STOWE @ 0641 07/25/15 MKELLY    Culture   Final    ESCHERICHIA COLI Confirmed Extended Spectrum Beta-Lactamase Producer (ESBL) Performed at [facility]    Report Status 07/27/2015 FINAL  Final   Organism ID, Bacteria ESCHERICHIA COLI  Final      Susceptibility   Escherichia coli - MIC*    AMPICILLIN >=32 RESISTANT Resistant     CEFAZOLIN >=64 RESISTANT Resistant     CEFEPIME 4 RESISTANT Resistant     CEFTAZIDIME 4 RESISTANT Resistant     CEFTRIAXONE >=64 RESISTANT Resistant     CIPROFLOXACIN >=4 RESISTANT Resistant     GENTAMICIN <=1 SENSITIVE Sensitive     IMIPENEM <=0.25 SENSITIVE Sensitive     TRIMETH/SULFA >=320 RESISTANT Resistant     AMPICILLIN/SULBACTAM 8 SENSITIVE Sensitive     PIP/TAZO <=4 SENSITIVE Sensitive     * ESCHERICHIA COLI  MRSA PCR Screening     Status: None   Collection Time: 07/25/15  1:47 AM  Result Value Ref Range Status   MRSA by PCR NEGATIVE NEGATIVE Final     Comment:        The GeneXpert MRSA Assay (FDA approved for NASAL specimens only), is one component of a comprehensive MRSA colonization surveillance program. It is not intended to diagnose MRSA infection nor to guide or monitor treatment for MRSA infections.      Studies: Dg Chest Port 1 View  07/25/2015   CLINICAL DATA:  Acute respiratory failure with hypoxia.  EXAM: PORTABLE CHEST - 1 VIEW  COMPARISON:  Chest radiographs obtained yesterday.  FINDINGS: Lower lung volumes from prior exam. Development of vascular congestion. Right basilar and left perihilar linear atelectasis. No confluent airspace disease. No large pleural effusion or pneumothorax.  IMPRESSION: Lower lung volumes with development of vascular congestion and scattered atelectasis.   Electronically Signed   By: Jeb Levering M.D.   On: 07/25/2015 21:36   Dg Abd Portable 1v  07/26/2015   CLINICAL DATA:  59 year old male with abdominal pain and distention  EXAM: PORTABLE ABDOMEN - 1 VIEW  COMPARISON:  CT dated 07/24/2015  FINDINGS: The bowel gas pattern is normal. No radio-opaque calculi or other significant radiographic abnormality are seen.  IMPRESSION: No evidence of bowel obstruction.   Electronically Signed   By: Anner Crete M.D.   On: 07/26/2015 20:24    Scheduled Meds: . acetaminophen  1,000 mg Oral TID  . calcium carbonate  1 tablet Oral QHS  . docusate sodium  100 mg Oral BID  . ertapenem  1 g Intravenous Q24H  . fluticasone  2 spray Each Nare Daily  . pantoprazole  40 mg Oral Daily  . phenazopyridine  100 mg Oral TID WC  .  polyethylene glycol  17 g Oral Daily  . senna  2 tablet Oral QHS  . sodium chloride  3 mL Intravenous Q12H  . tamsulosin  0.4 mg Oral Daily  . vitamin E  100 Units Oral q morning - 10a   Continuous Infusions:   Principal Problem:   Sepsis Active Problems:   Acid reflux   UTI (lower urinary tract infection)   Depression   Anxiety   AKI (acute kidney injury)   Acute urinary  retention   Hematuria   Constipation    Time spent: 30 min    Jasmeet Gehl, [facility] Pager [phone]. If 7PM-7AM, please contact night-coverage at [email or web address], password [facility] 07/27/2015, 2:16 PM  LOS: 3 days

## 2015-07-28 DIAGNOSIS — R338 Other retention of urine: Secondary | ICD-10-CM

## 2015-07-28 DIAGNOSIS — A419 Sepsis, unspecified organism: Secondary | ICD-10-CM

## 2015-07-28 DIAGNOSIS — N179 Acute kidney failure, unspecified: Secondary | ICD-10-CM

## 2015-07-28 DIAGNOSIS — N390 Urinary tract infection, site not specified: Secondary | ICD-10-CM

## 2015-07-28 DIAGNOSIS — F329 Major depressive disorder, single episode, unspecified: Secondary | ICD-10-CM

## 2015-07-28 DIAGNOSIS — F419 Anxiety disorder, unspecified: Secondary | ICD-10-CM

## 2015-07-28 LAB — BASIC METABOLIC PANEL
ANION GAP: 6 (ref 5–15)
ANION GAP: 4 — AB (ref 5–15)
BUN: 29 mg/dL — ABNORMAL HIGH (ref 6–20)
BUN: 23 mg/dL — AB (ref 6–20)
CALCIUM: 8.4 mg/dL — AB (ref 8.9–10.3)
CHLORIDE: 108 mmol/L (ref 101–111)
CHLORIDE: 108 mmol/L (ref 101–111)
CO2: 24 mmol/L (ref 22–32)
CO2: 27 mmol/L (ref 22–32)
Calcium: 8.5 mg/dL — ABNORMAL LOW (ref 8.9–10.3)
Creatinine, Ser: 4.51 mg/dL — ABNORMAL HIGH (ref 0.61–1.24)
Creatinine, Ser: 2.98 mg/dL — ABNORMAL HIGH (ref 0.61–1.24)
GFR calc Af Amer: 25 mL/min — ABNORMAL LOW (ref >60)
GFR calc non Af Amer: 13 mL/min — ABNORMAL LOW (ref >60)
GFR, EST AFRICAN AMERICAN: 15 mL/min — AB (ref >60)
GFR, EST NON AFRICAN AMERICAN: 22 mL/min — AB (ref >60)
Glucose, Bld: 118 mg/dL — ABNORMAL HIGH (ref 65–99)
Glucose, Bld: 122 mg/dL — ABNORMAL HIGH (ref 65–99)
POTASSIUM: 3.9 mmol/L (ref 3.5–5.1)
Potassium: 3.8 mmol/L (ref 3.5–5.1)
SODIUM: 138 mmol/L (ref 135–145)
Sodium: 139 mmol/L (ref 135–145)

## 2015-07-28 LAB — CULTURE, BLOOD (ROUTINE X 2)

## 2015-07-28 LAB — CBC
HCT: 34.9 % — ABNORMAL LOW (ref 39.0–52.0)
HEMOGLOBIN: 11.6 g/dL — AB (ref 13.0–17.0)
MCH: 30.2 pg (ref 26.0–34.0)
MCHC: 33.2 g/dL (ref 30.0–36.0)
MCV: 90.9 fL (ref 78.0–100.0)
PLATELETS: 130 10*3/uL — AB (ref 150–400)
RBC: 3.84 MIL/uL — AB (ref 4.22–5.81)
RDW: 14.2 % (ref 11.5–15.5)
WBC: 6.3 10*3/uL (ref 4.0–10.5)

## 2015-07-28 MED ORDER — ERTAPENEM SODIUM 1 G IJ SOLR
500.0000 mg | INTRAMUSCULAR | Status: DC
  Administered 2015-07-28: 0.5 g via INTRAVENOUS
  Filled 2015-07-28 (×2): qty 0.5

## 2015-07-28 MED ORDER — SODIUM CHLORIDE 0.9 % IV SOLN
INTRAVENOUS | Status: DC
  Administered 2015-07-28: 75 mL/h via INTRAVENOUS
  Administered 2015-07-29: 16:00:00 via INTRAVENOUS

## 2015-07-28 NOTE — Care Management Note (Signed)
Case Management Note  Patient Details  Name: Craig Butler MRN: [id number] Date of Birth: 12-Jun-1956  Subjective/Objective:                  Confirmed face sheet information . Wife Hoyle Sauer [phone], daughter Nydia Bouton [phone]   Action/Plan:  Confirmed face sheet information.  Expected Discharge Date:                  Expected Discharge Plan:  [place]  In-House Referral:     Discharge planning Services  CM Consult  Post Acute Care Choice:  Home Health Choice offered to:     DME Arranged:    DME Agency:     HH Arranged:  RN [place] Agency:  [place]  Status of Service:  Completed, signed off  Medicare Important Message Given:    Date Medicare IM Given:    Medicare IM give by:    Date Additional Medicare IM Given:    Additional Medicare Important Message give by:     If discussed at [place] of Stay Meetings, dates discussed:    Additional Comments:  Marilu Favre, RN 07/28/2015, 9:59 AM

## 2015-07-28 NOTE — Progress Notes (Signed)
ANTIBIOTIC CONSULT NOTE - INITIAL  Pharmacy Consult for ertapenem Indication: ESBL EColi  No Known Allergies  Patient Measurements: Height: 5\' 9"  (175.3 cm) Weight: 253 lb 15.5 oz (115.2 kg) IBW/kg (Calculated) : 70.7  Labs:  Recent Labs  07/26/15 0359 07/27/15 0845 07/28/15 0353  WBC 9.3 6.0 6.3  HGB 12.5* 12.0* 11.6*  PLT 105* 103* 130*  CREATININE 1.43* 2.53* 4.51*   Estimated Creatinine Clearance: 22.6 mL/min (by C-G formula based on Cr of 4.51).  Recent Labs  07/25/15 1120  GENTRANDOM <0.5     Microbiology: Recent Results (from the past 720 hour(s))  Urine culture     Status: None   Collection Time: 07/24/15  4:38 PM  Result Value Ref Range Status   Specimen Description URINE, CLEAN CATCH  Final   Special Requests NONE  Final   Culture   Final    >=100,000 COLONIES/mL ESCHERICHIA COLI Confirmed Extended Spectrum Beta-Lactamase Producer (ESBL) Performed at [facility]    Report Status 07/26/2015 FINAL  Final   Organism ID, Bacteria ESCHERICHIA COLI  Final      Susceptibility   Escherichia coli - MIC*    AMPICILLIN >=32 RESISTANT Resistant     CEFAZOLIN >=64 RESISTANT Resistant     CEFTRIAXONE >=64 RESISTANT Resistant     CIPROFLOXACIN >=4 RESISTANT Resistant     GENTAMICIN <=1 SENSITIVE Sensitive     IMIPENEM <=0.25 SENSITIVE Sensitive     NITROFURANTOIN <=16 SENSITIVE Sensitive     TRIMETH/SULFA >=320 RESISTANT Resistant     AMPICILLIN/SULBACTAM 8 SENSITIVE Sensitive     PIP/TAZO <=4 SENSITIVE Sensitive     * >=100,000 COLONIES/mL ESCHERICHIA COLI  Blood Culture (routine x 2)     Status: None (Preliminary result)   Collection Time: 07/24/15  4:46 PM  Result Value Ref Range Status   Specimen Description BLOOD RIGHT ANTECUBITAL  Final   Special Requests BOTTLES DRAWN AEROBIC AND ANAEROBIC 5 CC EACH  Final   Culture  Setup Time   Final    GRAM NEGATIVE RODS IN BOTH AEROBIC AND ANAEROBIC BOTTLES CRITICAL RESULT CALLED TO, READ BACK BY AND  VERIFIED WITH: S STOWE@0639  07/25/15 M KELLY    Culture   Final    GRAM NEGATIVE RODS Performed at [facility]    Report Status PENDING  Incomplete  Blood Culture (routine x 2)     Status: None   Collection Time: 07/24/15  5:19 PM  Result Value Ref Range Status   Specimen Description BLOOD RIGHT FOREARM  Final   Special Requests BOTTLES DRAWN AEROBIC AND ANAEROBIC 5CC  Final   Culture  Setup Time   Final    GRAM NEGATIVE RODS IN BOTH AEROBIC AND ANAEROBIC BOTTLES CRITICAL RESULT CALLED TO, READ BACK BY AND VERIFIED WITH: S STOWE @ 0641 07/25/15 MKELLY    Culture   Final    ESCHERICHIA COLI Confirmed Extended Spectrum Beta-Lactamase Producer (ESBL) Performed at [facility]    Report Status 07/27/2015 FINAL  Final   Organism ID, Bacteria ESCHERICHIA COLI  Final      Susceptibility   Escherichia coli - MIC*    AMPICILLIN >=32 RESISTANT Resistant     CEFAZOLIN >=64 RESISTANT Resistant     CEFEPIME 4 RESISTANT Resistant     CEFTAZIDIME 4 RESISTANT Resistant     CEFTRIAXONE >=64 RESISTANT Resistant     CIPROFLOXACIN >=4 RESISTANT Resistant     GENTAMICIN <=1 SENSITIVE Sensitive     IMIPENEM <=0.25 SENSITIVE Sensitive  TRIMETH/SULFA >=320 RESISTANT Resistant     AMPICILLIN/SULBACTAM 8 SENSITIVE Sensitive     PIP/TAZO <=4 SENSITIVE Sensitive     * ESCHERICHIA COLI  MRSA PCR Screening     Status: None   Collection Time: 07/25/15  1:47 AM  Result Value Ref Range Status   MRSA by PCR NEGATIVE NEGATIVE Final    Comment:        The GeneXpert MRSA Assay (FDA approved for NASAL specimens only), is one component of a comprehensive MRSA colonization surveillance program. It is not intended to diagnose MRSA infection nor to guide or monitor treatment for MRSA infections.    Assessment: 43 YOM who presented after a prostate cancer biopsy with fever and chills, found to have sepsis and was in the ICU. ESBL Ecoli isolated in blood and urine cultures - on  Ertapenem.  Renal function worsening.  SCr 4.51, CrCl 23 mL/min  WBC down to 6.3, afebrile Goal of Therapy:  eradication of infection  Plan:  Reduce ertapenem to 500mg  IV q24 Continue to follow clinical progression and renal function  Heide Guile, PharmD, BCPS-AQ ID Clinical Pharmacist Pager [phone]   07/28/2015 8:09 AM

## 2015-07-28 NOTE — Progress Notes (Signed)
[facility] PROGRESS NOTE  Craig Butler ZJI:[id number] DOB: [birth year] DOA: 07/24/2015 PCP:  Melinda Crutch, MD  Brief Summary  59 y.o. male with PMH GERD, migraine headache, depression, anxiety, who presented with fever, chills, dysuria, rectal pain.  He underwent prostate biopsy yesterday, and developed a fever, chills today.  He felt nauseated, but denied vomiting or diarrhea.  He also reported some rectal bleeding after the biopsy but this improved over the last day.  He was admitted to the floor initially but became hypotensive and was transferred to [facility] for ICU bed.  9/12:  Started ertapenem for ESBL E. coli 9/13:  AKI  Assessment/Plan  Severe sepsis secondary to ESBL E. Coli, blood pressure improved with IVF.   - Likely complicated by prostate biopsy - continue ertapenem for 4 week course of antibiotics (for prostatitis) -Would NOT use any other agents as resistance is dependant on severity of infection and average resistance is common - will need PICC line once repeat blood culture is negative for at least 24 hours -  BCx 9/13 pending  Acute respiratory failure with hypoxia secondary to probable acute diastolic heart failure and mild ARDS from sepsis, resolved after 1 dose of lasix.    AKI  secondary to acute urinary retention -  Avoid nephrotoxins and renally dose medications  -  Will hold on IVF for now that voiding some and repeat BMP in AM  Hematuria with acute urinary retention, urine clearing and still voiding some -  Continue to hold aspirin and heparin -  SCDs -  Urology aware and discussed scenaria -  Foley placed as gfr >60 --->15 overngiht -  Started flomax on 9/12 - start IVF 75 cc/ht  Constipation, not passing gas, probably secondary to narcotics but may also have ileus from severe sepsis -  Continue miralax, colace, senna, and give 1 dose of bisacodyl PO:  No relief -  Magnesium citrate >> no relief -  Bisacodyl suppository and KUB ordered -   Relieving constipation will help with urinary retention also  Prostate cancer, low risk -Urology consulted and following -Most recent PSA 4.8  Lactic acidosis -Resolved  Depression/anxiety -On no home medications  GERD -Continue PPI  -  Hold TUMS tonight since this can be constipating also.  Replace with maalox.    Diet:  Healthy heart Access:  PIV IVF:  off Proph:  SCDs  Code Status: full Family Communication: patient alone Disposition Plan: pending PICC line placement for antibiotics.    Consultants:  Urology  Procedures:  CT abd/pelvis  Antibiotics:  Zosyn 9/10 > 9/11  Vanc 9/10 x 1   Ertapenem 9/12 >>  HPI/Subjective:     Objective: Filed Vitals:   07/27/15 0451 07/27/15 1425 07/27/15 2029 07/28/15 0503  BP: 128/72 126/83 127/72 131/84  Pulse: 79 83 87 64  Temp: 98.2 F (36.8 C) 98.5 F (36.9 C) 98.8 F (37.1 C) 98.6 F (37 C)  TempSrc: Oral Oral Oral Oral  Resp: 18 18 18 19   Height:      Weight: 117.2 kg (258 lb 6.1 oz)   115.2 kg (253 lb 15.5 oz)  SpO2: 94% 98% 97% 98%    Intake/Output Summary (Last 24 hours) at 07/28/15 1156 Last data filed at 07/28/15 1024  Gross per 24 hour  Intake    360 ml  Output   2150 ml  Net  -1790 ml   Filed Weights   07/26/15 0631 07/27/15 0451 07/28/15 0503  Weight: 113.6 kg (250  lb 7.1 oz) 117.2 kg (258 lb 6.1 oz) 115.2 kg (253 lb 15.5 oz)   Body mass index is 37.49 kg/(m^2).  Exam:   General:  Obese male, No acute distress  HEENT:  NCAT, MMM  Cardiovascular:  RRR, nl S1, S2 no mrg, 2+ pulses, warm extremities  Respiratory:  CTAB, no increased WOB  Abdomen:   NABS, soft, nondistended, mildly TTP over suprapubic area without rebound or guarding. Bladder still feels full  MSK:   Normal tone and bulk, trace pitting bilateral LEE  Neuro:  Grossly intact  Data Reviewed: Basic Metabolic Panel:  Recent Labs Lab 07/24/15 1703 07/25/15 0327 07/26/15 0359 07/27/15 0845 07/28/15 0353  NA  136 142 134* 139 138  K 3.9 4.9 4.1 3.8 3.8  CL 105 115* 106 111 108  CO2 22 22 23 22 24   GLUCOSE 117* 111* 137* 120* 118*  BUN 18 11 12 20  29*  CREATININE 1.26* 1.35* 1.43* 2.53* 4.51*  CALCIUM 9.2 7.4* 7.7* 8.7* 8.4*  MG  --  1.4*  --   --   --   PHOS  --  2.7  --   --   --    Liver Function Tests:  Recent Labs Lab 07/24/15 1703  AST 23  ALT 24  ALKPHOS 79  BILITOT 1.3*  PROT 6.8  ALBUMIN 4.2   No results for input(s): LIPASE, AMYLASE in the last 168 hours. No results for input(s): AMMONIA in the last 168 hours. CBC:  Recent Labs Lab 07/24/15 1703 07/25/15 0327 07/26/15 0359 07/27/15 0845 07/28/15 0353  WBC 7.3 13.9*  13.4* 9.3 6.0 6.3  NEUTROABS 7.0  --   --   --   --   HGB 15.9 10.6*  13.1 12.5* 12.0* 11.6*  HCT 46.8 31.0*  39.1 38.9* 36.1* 34.9*  MCV 91.4 95.1  93.5 93.1 90.7 90.9  PLT 169 196  131* 105* 103* 130*    Recent Results (from the past 240 hour(s))  Urine culture     Status: None   Collection Time: 07/24/15  4:38 PM  Result Value Ref Range Status   Specimen Description URINE, CLEAN CATCH  Final   Special Requests NONE  Final   Culture   Final    >=100,000 COLONIES/mL ESCHERICHIA COLI Confirmed Extended Spectrum Beta-Lactamase Producer (ESBL) Performed at [facility]    Report Status 07/26/2015 FINAL  Final   Organism ID, Bacteria ESCHERICHIA COLI  Final      Susceptibility   Escherichia coli - MIC*    AMPICILLIN >=32 RESISTANT Resistant     CEFAZOLIN >=64 RESISTANT Resistant     CEFTRIAXONE >=64 RESISTANT Resistant     CIPROFLOXACIN >=4 RESISTANT Resistant     GENTAMICIN <=1 SENSITIVE Sensitive     IMIPENEM <=0.25 SENSITIVE Sensitive     NITROFURANTOIN <=16 SENSITIVE Sensitive     TRIMETH/SULFA >=320 RESISTANT Resistant     AMPICILLIN/SULBACTAM 8 SENSITIVE Sensitive     PIP/TAZO <=4 SENSITIVE Sensitive     * >=100,000 COLONIES/mL ESCHERICHIA COLI  Blood Culture (routine x 2)     Status: None   Collection Time: 07/24/15   4:46 PM  Result Value Ref Range Status   Specimen Description BLOOD RIGHT ANTECUBITAL  Final   Special Requests BOTTLES DRAWN AEROBIC AND ANAEROBIC 5 CC EACH  Final   Culture  Setup Time   Final    GRAM NEGATIVE RODS IN BOTH AEROBIC AND ANAEROBIC BOTTLES CRITICAL RESULT CALLED TO, READ BACK BY AND  VERIFIED WITH: S STOWE@0639  07/25/15 M KELLY    Culture   Final    ESCHERICHIA COLI SUSCEPTIBILITIES PERFORMED ON PREVIOUS CULTURE WITHIN THE LAST 5 DAYS. Performed at [facility]    Report Status 07/28/2015 FINAL  Final  Blood Culture (routine x 2)     Status: None   Collection Time: 07/24/15  5:19 PM  Result Value Ref Range Status   Specimen Description BLOOD RIGHT FOREARM  Final   Special Requests BOTTLES DRAWN AEROBIC AND ANAEROBIC 5CC  Final   Culture  Setup Time   Final    GRAM NEGATIVE RODS IN BOTH AEROBIC AND ANAEROBIC BOTTLES CRITICAL RESULT CALLED TO, READ BACK BY AND VERIFIED WITH: S STOWE @ 0762 07/25/15 MKELLY    Culture   Final    ESCHERICHIA COLI Confirmed Extended Spectrum Beta-Lactamase Producer (ESBL) Performed at [facility]    Report Status 07/27/2015 FINAL  Final   Organism ID, Bacteria ESCHERICHIA COLI  Final      Susceptibility   Escherichia coli - MIC*    AMPICILLIN >=32 RESISTANT Resistant     CEFAZOLIN >=64 RESISTANT Resistant     CEFEPIME 4 RESISTANT Resistant     CEFTAZIDIME 4 RESISTANT Resistant     CEFTRIAXONE >=64 RESISTANT Resistant     CIPROFLOXACIN >=4 RESISTANT Resistant     GENTAMICIN <=1 SENSITIVE Sensitive     IMIPENEM <=0.25 SENSITIVE Sensitive     TRIMETH/SULFA >=320 RESISTANT Resistant     AMPICILLIN/SULBACTAM 8 SENSITIVE Sensitive     PIP/TAZO <=4 SENSITIVE Sensitive     * ESCHERICHIA COLI  MRSA PCR Screening     Status: None   Collection Time: 07/25/15  1:47 AM  Result Value Ref Range Status   MRSA by PCR NEGATIVE NEGATIVE Final    Comment:        The GeneXpert MRSA Assay (FDA approved for NASAL specimens only),  is one component of a comprehensive MRSA colonization surveillance program. It is not intended to diagnose MRSA infection nor to guide or monitor treatment for MRSA infections.      Studies: Dg Abd Portable 1v  07/26/2015   CLINICAL DATA:  59 year old male with abdominal pain and distention  EXAM: PORTABLE ABDOMEN - 1 VIEW  COMPARISON:  CT dated 07/24/2015  FINDINGS: The bowel gas pattern is normal. No radio-opaque calculi or other significant radiographic abnormality are seen.  IMPRESSION: No evidence of bowel obstruction.   Electronically Signed   By: Anner Crete M.D.   On: 07/26/2015 20:24    Scheduled Meds: . acetaminophen  1,000 mg Oral TID  . ertapenem  500 mg Intravenous Q24H  . fluticasone  2 spray Each Nare Daily  . pantoprazole  40 mg Oral Daily  . senna  2 tablet Oral QHS  . sodium chloride  3 mL Intravenous Q12H  . tamsulosin  0.4 mg Oral Daily  . vitamin E  100 Units Oral q morning - 10a   Continuous Infusions: . sodium chloride      Principal Problem:   Sepsis Active Problems:   Acid reflux   UTI (lower urinary tract infection)   Depression   Anxiety   AKI (acute kidney injury)   Acute urinary retention   Hematuria   Constipation    Time spent: 30 min  Verneita Griffes, MD [facility] (P) [phone]

## 2015-07-28 NOTE — Progress Notes (Signed)
Patient bladder scanned [phone] residual

## 2015-07-29 LAB — BASIC METABOLIC PANEL
ANION GAP: 5 (ref 5–15)
BUN: 16 mg/dL (ref 6–20)
CALCIUM: 8.2 mg/dL — AB (ref 8.9–10.3)
CO2: 27 mmol/L (ref 22–32)
CREATININE: 1.37 mg/dL — AB (ref 0.61–1.24)
Chloride: 109 mmol/L (ref 101–111)
GFR calc Af Amer: >60 mL/min (ref >60)
GFR, EST NON AFRICAN AMERICAN: 56 mL/min — AB (ref >60)
GLUCOSE: 101 mg/dL — AB (ref 65–99)
Potassium: 4.1 mmol/L (ref 3.5–5.1)
Sodium: 141 mmol/L (ref 135–145)

## 2015-07-29 LAB — CBC
HCT: 32.4 % — ABNORMAL LOW (ref 39.0–52.0)
Hemoglobin: 10.6 g/dL — ABNORMAL LOW (ref 13.0–17.0)
MCH: 29.9 pg (ref 26.0–34.0)
MCHC: 32.7 g/dL (ref 30.0–36.0)
MCV: 91.3 fL (ref 78.0–100.0)
PLATELETS: 173 10*3/uL (ref 150–400)
RBC: 3.55 MIL/uL — ABNORMAL LOW (ref 4.22–5.81)
RDW: 14.4 % (ref 11.5–15.5)
WBC: 4.4 10*3/uL (ref 4.0–10.5)

## 2015-07-29 MED ORDER — AMOXICILLIN-POT CLAVULANATE 875-125 MG PO TABS
1.0000 | ORAL_TABLET | Freq: Two times a day (BID) | ORAL | Status: DC
  Administered 2015-07-29 (×2): 1 via ORAL
  Filled 2015-07-29 (×2): qty 1

## 2015-07-29 NOTE — Consult Note (Signed)
Craig Butler, patient of mine, with low grade prostate cancer who became bacteremic after a prostate biopsy which he was covered with levoquin for.  He has been in/out of ICU for transient hypotension.  Intv:  Renal function declined precipitously, patient found to be in retention.  Foley catheter was placed and patient feels a lot better. Recent cultures negative No issues overnight Complains of some foley discomfort, o/w doing well this AM  Filed Vitals:   07/29/15 0515  BP: 125/74  Pulse: 71  Temp: 98.7 F (37.1 C)  Resp: 16   I/O last 3 completed shifts: In: 720 [P.O.:720] Out: 6275 [Urine:6275]  NAD RRR Non-labored breathing Abdomen is soft Ext sym Foley draining clear urine   Recent Labs  07/27/15 0845 07/28/15 0353 07/29/15 0357  WBC 6.0 6.3 4.4  HGB 12.0* 11.6* 10.6*  HCT 36.1* 34.9* 32.4*    Recent Labs  07/28/15 0353 07/28/15 1333 07/29/15 0357  NA 138 139 141  K 3.8 3.9 4.1  CL 108 108 109  CO2 24 27 27   GLUCOSE 118* 122* 101*  BUN 29* 23* 16  CREATININE 4.51* 2.98* 1.37*  CALCIUM 8.4* 8.5* 8.2*   No results for input(s): LABPT, INR in the last 72 hours. No results for input(s): PSA in the last 72 hours. No results for input(s): LABURIN in the last 72 hours. Results for orders placed or performed during the hospital encounter of 07/24/15  Urine culture     Status: None   Collection Time: 07/24/15  4:38 PM  Result Value Ref Range Status   Specimen Description URINE, CLEAN CATCH  Final   Special Requests NONE  Final   Culture   Final    >=100,000 COLONIES/mL ESCHERICHIA COLI Confirmed Extended Spectrum Beta-Lactamase Producer (ESBL) Performed at [facility]    Report Status 07/26/2015 FINAL  Final   Organism ID, Bacteria ESCHERICHIA COLI  Final      Susceptibility   Escherichia coli - MIC*    AMPICILLIN >=32 RESISTANT Resistant     CEFAZOLIN >=64 RESISTANT Resistant     CEFTRIAXONE >=64 RESISTANT Resistant     CIPROFLOXACIN >=4 RESISTANT  Resistant     GENTAMICIN <=1 SENSITIVE Sensitive     IMIPENEM <=0.25 SENSITIVE Sensitive     NITROFURANTOIN <=16 SENSITIVE Sensitive     TRIMETH/SULFA >=320 RESISTANT Resistant     AMPICILLIN/SULBACTAM 8 SENSITIVE Sensitive     PIP/TAZO <=4 SENSITIVE Sensitive     * >=100,000 COLONIES/mL ESCHERICHIA COLI  Blood Culture (routine x 2)     Status: None   Collection Time: 07/24/15  4:46 PM  Result Value Ref Range Status   Specimen Description BLOOD RIGHT ANTECUBITAL  Final   Special Requests BOTTLES DRAWN AEROBIC AND ANAEROBIC 5 CC EACH  Final   Culture  Setup Time   Final    GRAM NEGATIVE RODS IN BOTH AEROBIC AND ANAEROBIC BOTTLES CRITICAL RESULT CALLED TO, READ BACK BY AND VERIFIED WITH: S STOWE@0639  07/25/15 M KELLY    Culture   Final    ESCHERICHIA COLI SUSCEPTIBILITIES PERFORMED ON PREVIOUS CULTURE WITHIN THE LAST 5 DAYS. Performed at [facility]    Report Status 07/28/2015 FINAL  Final  Blood Culture (routine x 2)     Status: None   Collection Time: 07/24/15  5:19 PM  Result Value Ref Range Status   Specimen Description BLOOD RIGHT FOREARM  Final   Special Requests BOTTLES DRAWN AEROBIC AND ANAEROBIC 5CC  Final   Culture  Setup  Time   Final    GRAM NEGATIVE RODS IN BOTH AEROBIC AND ANAEROBIC BOTTLES CRITICAL RESULT CALLED TO, READ BACK BY AND VERIFIED WITH: S STOWE @ [phone] 07/25/15 MKELLY    Culture   Final    ESCHERICHIA COLI Confirmed Extended Spectrum Beta-Lactamase Producer (ESBL) Performed at [facility]    Report Status 07/27/2015 FINAL  Final   Organism ID, Bacteria ESCHERICHIA COLI  Final      Susceptibility   Escherichia coli - MIC*    AMPICILLIN >=32 RESISTANT Resistant     CEFAZOLIN >=64 RESISTANT Resistant     CEFEPIME 4 RESISTANT Resistant     CEFTAZIDIME 4 RESISTANT Resistant     CEFTRIAXONE >=64 RESISTANT Resistant     CIPROFLOXACIN >=4 RESISTANT Resistant     GENTAMICIN <=1 SENSITIVE Sensitive     IMIPENEM <=0.25 SENSITIVE Sensitive      TRIMETH/SULFA >=320 RESISTANT Resistant     AMPICILLIN/SULBACTAM 8 SENSITIVE Sensitive     PIP/TAZO <=4 SENSITIVE Sensitive     * ESCHERICHIA COLI  MRSA PCR Screening     Status: None   Collection Time: 07/25/15  1:47 AM  Result Value Ref Range Status   MRSA by PCR NEGATIVE NEGATIVE Final    Comment:        The GeneXpert MRSA Assay (FDA approved for NASAL specimens only), is one component of a comprehensive MRSA colonization surveillance program. It is not intended to diagnose MRSA infection nor to guide or monitor treatment for MRSA infections.   Culture, blood (single)     Status: None (Preliminary result)   Collection Time: 07/27/15  4:21 AM  Result Value Ref Range Status   Specimen Description BLOOD RIGHT ANTECUBITAL  Final   Special Requests BOTTLES DRAWN AEROBIC AND ANAEROBIC 10CC  Final   Culture NO GROWTH 1 DAY  Final   Report Status PENDING  Incomplete     Imp: Unfortunately 54M who became bactermic following a prostate biopsy as part of his active surveillance protocol for very low risk prostate cancer.   ESBL c.coli grown in urine and blood cultures.  Repeat cultures negative on empiric therapy.   ARF resolving with catheter.  Suspect that prostate edema lead to retention, which is associated with his prostatitis.   Rec:  I spoke directly to ID, Dr. Megan Salon in regards to the possibility of the patient taking Augmentin based on culture results.  He thought this would be reasonable given its sensitive to Zosyn as well suggesting that the sulbactam would be enough to overcome the EBSL.  This would minimize his issue with home IV abx.  He will need to transition today and prove tolerance x 1 day prior to discharge.  He should be given this for 4 weeks. He will go home with his catheter, we will give him a voiding trial on Monday morning.  He should start Flomax today and be discharged with this.

## 2015-07-29 NOTE — Progress Notes (Signed)
[facility] PROGRESS NOTE  Craig Butler PIR:[id number] DOB: 09/22/1956 DOA: 07/24/2015 PCP:  Melinda Crutch, MD  Brief Summary  59 y.o. male with PMH GERD, migraine headache, depression, anxiety, who presented with fever, chills, dysuria, rectal pain.  He underwent prostate biopsy yesterday, and developed a fever, chills today.  He felt nauseated, but denied vomiting or diarrhea.  He also reported some rectal bleeding after the biopsy but this improved over the last day.  He was admitted to the floor initially but became hypotensive and was transferred to [facility] for ICU bed.  9/12:  Started ertapenem for ESBL E. coli 9/13:  AKI  Assessment/Plan  Severe sepsis secondary to ESBL E. Coli, blood pressure improved with IVF.   - Likely complicated by prostate biopsy - Initially placed on ertapenem for 4 week course of antibiotics (for prostatitis)--urology Dr. Louis Meckel discussed with Dr. Megan Salon of ID who okayed use of Augmentin for 4 weeks of therapy -  BCx 9/13 negative so far  Acute respiratory failure with hypoxia secondary to probable acute diastolic heart failure and mild ARDS from sepsis, resolved after 1 dose of lasix.    AKI  secondary to acute urinary retention -  Avoid nephrotoxins and renally dose medications   Hematuria with acute urinary retention,-resolving now -  Continue to hold aspirin and heparin -  SCDs -  Urology aware and discussed scenaria -  Foley placed as gfr >60 --->15 overnight--> this has rebounded and has improved to a GFR of about 60 since 9/14 -  Started flomax on 9/12 -   Taper IVF 75 cc/h-->50 cc per hour wit aim to d/c IVF on 9/16  Constipation, not passing gas, probably secondary to narcotics but may also have ileus from severe sepsis -  Continue miralax, colace, senna, and give 1 dose of bisacodyl PO:  No relief -  Magnesium citrate >>passing some mucous now  Prostate cancer, low risk -Urology consulted and following -Most recent PSA  4.8  Lactic acidosis -Resolved  Depression/anxiety -On no home medications  GERD -Continue PPI  -  Hold TUMS doing fair  Replace with maalox.    Diet:  Healthy heart Access:  PIV IVF:  off Proph:  SCDs  Code Status: full Family Communication: patient alone Disposition Plan:  Mobilize as well as ambulate with leg bag and potential discharge in a.m.   Consultants:  Urology  Procedures:  CT abd/pelvis  Antibiotics:  Zosyn 9/10 > 9/11  Vanc 9/10 x 1   Ertapenem 9/12 >> 9/15  Augmentin 9/15-->08/21/15 anticipated stop date  HPI/Subjective:     Objective: Filed Vitals:   07/28/15 0503 07/28/15 1500 07/28/15 2125 07/29/15 0515  BP: 131/84 136/82 121/75 125/74  Pulse: 64 75 76 71  Temp: 98.6 F (37 C) 98.2 F (36.8 C) 98.2 F (36.8 C) 98.7 F (37.1 C)  TempSrc: Oral Oral Oral   Resp: 19 18 17 16   Height:      Weight: 115.2 kg (253 lb 15.5 oz)   116.7 kg (257 lb 4.4 oz)  SpO2: 98% 97% 97% 99%    Intake/Output Summary (Last 24 hours) at 07/29/15 1045 Last data filed at 07/29/15 6301  Gross per 24 hour  Intake    480 ml  Output   4225 ml  Net  -3745 ml   Filed Weights   07/27/15 0451 07/28/15 0503 07/29/15 0515  Weight: 117.2 kg (258 lb 6.1 oz) 115.2 kg (253 lb 15.5 oz) 116.7 kg (257 lb 4.4 oz)  Body mass index is 37.98 kg/(m^2).  Exam:   General:  Obese male, No acute distress  HEENT:  NCAT, MMM  Cardiovascular:  RRR, nl S1, S2 no mrg, 2+ pulses, warm extremities  Respiratory:  CTAB, no increased WOB  Abdomen:   NABS, soft, nondistended, mildly TTP over suprapubic area without rebound or guarding.   Grade 1 lower extremity edema   Data Reviewed: Basic Metabolic Panel:  Recent Labs Lab 07/25/15 0327 07/26/15 0359 07/27/15 0845 07/28/15 0353 07/28/15 1333 07/29/15 0357  NA 142 134* 139 138 139 141  K 4.9 4.1 3.8 3.8 3.9 4.1  CL 115* 106 111 108 108 109  CO2 22 23 22 24 27 27   GLUCOSE 111* 137* 120* 118* 122* 101*  BUN 11 12  20  29* 23* 16  CREATININE 1.35* 1.43* 2.53* 4.51* 2.98* 1.37*  CALCIUM 7.4* 7.7* 8.7* 8.4* 8.5* 8.2*  MG 1.4*  --   --   --   --   --   PHOS 2.7  --   --   --   --   --    Liver Function Tests:  Recent Labs Lab 07/24/15 1703  AST 23  ALT 24  ALKPHOS 79  BILITOT 1.3*  PROT 6.8  ALBUMIN 4.2   No results for input(s): LIPASE, AMYLASE in the last 168 hours. No results for input(s): AMMONIA in the last 168 hours. CBC:  Recent Labs Lab 07/24/15 1703 07/25/15 0327 07/26/15 0359 07/27/15 0845 07/28/15 0353 07/29/15 0357  WBC 7.3 13.9*  13.4* 9.3 6.0 6.3 4.4  NEUTROABS 7.0  --   --   --   --   --   HGB 15.9 10.6*  13.1 12.5* 12.0* 11.6* 10.6*  HCT 46.8 31.0*  39.1 38.9* 36.1* 34.9* 32.4*  MCV 91.4 95.1  93.5 93.1 90.7 90.9 91.3  PLT 169 196  131* 105* 103* 130* 173    Recent Results (from the past 240 hour(s))  Urine culture     Status: None   Collection Time: 07/24/15  4:38 PM  Result Value Ref Range Status   Specimen Description URINE, CLEAN CATCH  Final   Special Requests NONE  Final   Culture   Final    >=100,000 COLONIES/mL ESCHERICHIA COLI Confirmed Extended Spectrum Beta-Lactamase Producer (ESBL) Performed at [facility]    Report Status 07/26/2015 FINAL  Final   Organism ID, Bacteria ESCHERICHIA COLI  Final      Susceptibility   Escherichia coli - MIC*    AMPICILLIN >=32 RESISTANT Resistant     CEFAZOLIN >=64 RESISTANT Resistant     CEFTRIAXONE >=64 RESISTANT Resistant     CIPROFLOXACIN >=4 RESISTANT Resistant     GENTAMICIN <=1 SENSITIVE Sensitive     IMIPENEM <=0.25 SENSITIVE Sensitive     NITROFURANTOIN <=16 SENSITIVE Sensitive     TRIMETH/SULFA >=320 RESISTANT Resistant     AMPICILLIN/SULBACTAM 8 SENSITIVE Sensitive     PIP/TAZO <=4 SENSITIVE Sensitive     * >=100,000 COLONIES/mL ESCHERICHIA COLI  Blood Culture (routine x 2)     Status: None   Collection Time: 07/24/15  4:46 PM  Result Value Ref Range Status   Specimen Description  BLOOD RIGHT ANTECUBITAL  Final   Special Requests BOTTLES DRAWN AEROBIC AND ANAEROBIC 5 CC EACH  Final   Culture  Setup Time   Final    GRAM NEGATIVE RODS IN BOTH AEROBIC AND ANAEROBIC BOTTLES CRITICAL RESULT CALLED TO, READ BACK BY AND VERIFIED WITH: S STOWE@0639   07/25/15 M KELLY    Culture   Final    ESCHERICHIA COLI SUSCEPTIBILITIES PERFORMED ON PREVIOUS CULTURE WITHIN THE LAST 5 DAYS. Performed at [facility]    Report Status 07/28/2015 FINAL  Final  Blood Culture (routine x 2)     Status: None   Collection Time: 07/24/15  5:19 PM  Result Value Ref Range Status   Specimen Description BLOOD RIGHT FOREARM  Final   Special Requests BOTTLES DRAWN AEROBIC AND ANAEROBIC 5CC  Final   Culture  Setup Time   Final    GRAM NEGATIVE RODS IN BOTH AEROBIC AND ANAEROBIC BOTTLES CRITICAL RESULT CALLED TO, READ BACK BY AND VERIFIED WITH: S STOWE @ [phone] 07/25/15 MKELLY    Culture   Final    ESCHERICHIA COLI Confirmed Extended Spectrum Beta-Lactamase Producer (ESBL) Performed at [facility]    Report Status 07/27/2015 FINAL  Final   Organism ID, Bacteria ESCHERICHIA COLI  Final      Susceptibility   Escherichia coli - MIC*    AMPICILLIN >=32 RESISTANT Resistant     CEFAZOLIN >=64 RESISTANT Resistant     CEFEPIME 4 RESISTANT Resistant     CEFTAZIDIME 4 RESISTANT Resistant     CEFTRIAXONE >=64 RESISTANT Resistant     CIPROFLOXACIN >=4 RESISTANT Resistant     GENTAMICIN <=1 SENSITIVE Sensitive     IMIPENEM <=0.25 SENSITIVE Sensitive     TRIMETH/SULFA >=320 RESISTANT Resistant     AMPICILLIN/SULBACTAM 8 SENSITIVE Sensitive     PIP/TAZO <=4 SENSITIVE Sensitive     * ESCHERICHIA COLI  MRSA PCR Screening     Status: None   Collection Time: 07/25/15  1:47 AM  Result Value Ref Range Status   MRSA by PCR NEGATIVE NEGATIVE Final    Comment:        The GeneXpert MRSA Assay (FDA approved for NASAL specimens only), is one component of a comprehensive MRSA  colonization surveillance program. It is not intended to diagnose MRSA infection nor to guide or monitor treatment for MRSA infections.   Culture, blood (single)     Status: None (Preliminary result)   Collection Time: 07/27/15  4:21 AM  Result Value Ref Range Status   Specimen Description BLOOD RIGHT ANTECUBITAL  Final   Special Requests BOTTLES DRAWN AEROBIC AND ANAEROBIC 10CC  Final   Culture NO GROWTH 1 DAY  Final   Report Status PENDING  Incomplete     Studies: No results found.  Scheduled Meds: . acetaminophen  1,000 mg Oral TID  . amoxicillin-clavulanate  1 tablet Oral Q12H  . fluticasone  2 spray Each Nare Daily  . pantoprazole  40 mg Oral Daily  . senna  2 tablet Oral QHS  . sodium chloride  3 mL Intravenous Q12H  . tamsulosin  0.4 mg Oral Daily  . vitamin E  100 Units Oral q morning - 10a   Continuous Infusions: . sodium chloride 75 mL/hr (07/28/15 1424)    Principal Problem:   Sepsis Active Problems:   Acid reflux   UTI (lower urinary tract infection)   Depression   Anxiety   AKI (acute kidney injury)   Acute urinary retention   Hematuria   Constipation    Time spent: 30 min  Verneita Griffes, MD [facility] (P) [phone]

## 2015-07-30 DIAGNOSIS — K219 Gastro-esophageal reflux disease without esophagitis: Secondary | ICD-10-CM

## 2015-07-30 DIAGNOSIS — R319 Hematuria, unspecified: Secondary | ICD-10-CM

## 2015-07-30 LAB — BASIC METABOLIC PANEL
Anion gap: 6 (ref 5–15)
BUN: 11 mg/dL (ref 6–20)
CALCIUM: 8.5 mg/dL — AB (ref 8.9–10.3)
CO2: 26 mmol/L (ref 22–32)
CREATININE: 0.93 mg/dL (ref 0.61–1.24)
Chloride: 107 mmol/L (ref 101–111)
GFR calc Af Amer: >60 mL/min (ref >60)
GLUCOSE: 98 mg/dL (ref 65–99)
Potassium: 3.9 mmol/L (ref 3.5–5.1)
Sodium: 139 mmol/L (ref 135–145)

## 2015-07-30 LAB — CBC
HCT: 36.5 % — ABNORMAL LOW (ref 39.0–52.0)
Hemoglobin: 11.6 g/dL — ABNORMAL LOW (ref 13.0–17.0)
MCH: 29.4 pg (ref 26.0–34.0)
MCHC: 31.8 g/dL (ref 30.0–36.0)
MCV: 92.6 fL (ref 78.0–100.0)
PLATELETS: 169 10*3/uL (ref 150–400)
RBC: 3.94 MIL/uL — ABNORMAL LOW (ref 4.22–5.81)
RDW: 14.4 % (ref 11.5–15.5)
WBC: 6 10*3/uL (ref 4.0–10.5)

## 2015-07-30 MED ORDER — TAMSULOSIN HCL 0.4 MG PO CAPS: 0.4000 mg | ORAL_CAPSULE | Freq: Every day | ORAL | Status: DC

## 2015-07-30 MED ORDER — AMOXICILLIN-POT CLAVULANATE 875-125 MG PO TABS: 1.0000 | ORAL_TABLET | Freq: Two times a day (BID) | ORAL | Status: DC

## 2015-07-30 NOTE — Progress Notes (Signed)
Burman Blacksmith to be D/C'd Home per MD order.  Discussed with the patient and all questions fully answered.  VSS, Skin clean, dry and intact without evidence of skin break down, no evidence of skin tears noted. IV catheter discontinued intact. Site without signs and symptoms of complications. Dressing and pressure applied.  An After Visit Summary was printed and given to the patient. Patient received prescription.  D/c education completed with patient/family including follow up instructions, medication list, d/c activities limitations if indicated, with other d/c instructions as indicated by MD - patient able to verbalize understanding, all questions fully answered.   Patient instructed to return to ED, call 911, or call MD for any changes in condition.   Patient escorted via [place], and D/C home via private auto.  Donn Pierini Beckner 07/30/2015 [phone]

## 2015-07-30 NOTE — Discharge Summary (Signed)
Physician Discharge Summary  Craig Butler QVZ:[id number] DOB: 1956-04-20 DOA: 07/24/2015  PCP:  Melinda Crutch, MD  Admit date: 07/24/2015 Discharge date: 07/30/2015  Time spent: 35 minutes  Recommendations for Outpatient Follow-up:  1. Complete Augmentin 08/21/15 2. Needs Dr. Louis Meckel to f/u regarding Foley cath-will be d/c with foley 3. Suggest CBC + Bmet 1 week 4. New meds=FLomax/Augmentin 5. Recommned PCP discuss sleep issues and insomnia with him  Discharge Diagnoses:  Principal Problem:   Sepsis Active Problems:   Acid reflux   UTI (lower urinary tract infection)   Depression   Anxiety   AKI (acute kidney injury)   Acute urinary retention   Hematuria   Constipation   Discharge Condition: fair  Diet recommendation: good  Filed Weights   07/28/15 0503 07/29/15 0515 07/30/15 0617  Weight: 115.2 kg (253 lb 15.5 oz) 116.7 kg (257 lb 4.4 oz) 112.81 kg (248 lb 11.2 oz)    History of present illness:  59 y.o. ?  GERD,  migraine headache,  Bipolar , who presented with fever, chills, dysuria, rectal pain to ED  He underwent prostate biopsy 07/23/15, and developed a fever, chills today.  He felt nauseated, but denied vomiting or diarrhea.  He also reported some rectal bleeding after the biopsy but this improved over the last day.  He was admitted to the floor initially but became hypotensive and was transferred to [facility] for ICU bed.  9/12: Started ertapenem for ESBL E. coli 9/13: AKI  Severe sepsis secondary to ESBL E. Coli, blood pressure improved with IVF.  - Likely complicated by prostate biopsy - Initially placed on ertapenem for 4 week course of antibiotics (for prostatitis)--urology Dr. Louis Meckel discussed with Dr. Megan Salon of ID who okayed use of Augmentin for 4 weeks of therapy ending 08/21/15 - BCx 9/13 negative so far  Acute respiratory failure with hypoxia secondary to probable acute diastolic heart failure and mild ARDS from sepsis, resolved after 1  dose of lasix.  No further ongoing need for lasix Suggest OP work-up if indicated  AKI secondary to acute urinary retention - Avoid nephrotoxins and renally dose medications   Hematuria with acute urinary retention-resolving now - initally held aspirin and heparin - SCDs - Urology aware and discussed scenaria - Foley placed as gfr >60 --->15 overnight--> this has rebounded and has improved to a GFR of about 60 since 9/14 - Started flomax on 9/12 - Taper IVF 75 cc/h-->50 cc per hour wit aim to d/c IVF on 9/16  Constipation, not passing gas, probably secondary to narcotics but may also have ileus from severe sepsis - Continue miralax, colace, senna, and give 1 dose of bisacodyl PO: No relief - Magnesium citrate >>passing some mucous now  Prostate cancer, low risk -Urology consulted and following -Most recent PSA 4.8  Lactic acidosis -Resolved  Depression/anxiety -On no home medications  GERD -stable during hospital stay  Discharge Exam: Filed Vitals:   07/30/15 0617  BP: 121/73  Pulse: 59  Temp: 98.6 F (37 C)  Resp: 18    General: alert joking no issues No f,chill,n ,v Cardiovascular: s1 s 2no m/r/g Respiratory: clear no added sound  Discharge Instructions   Discharge Instructions    Diet - low sodium heart healthy    Complete by:  As directed      Discharge instructions    Complete by:  As directed   Complete Augmentin on 08/21/15 you may take melatonin in the early afternoon Ensure that you get some sun  exposure Please call your doctor to determine when you should follow up Dr. Louis Meckel will do a voiding trial probably within the next week-ask him about when you should follow up     Increase activity slowly    Complete by:  As directed           Current Discharge Medication List    START taking these medications   Details  amoxicillin-clavulanate (AUGMENTIN) 875-125 MG per tablet Take 1 tablet by mouth every 12 (twelve) hours. Qty: 44  tablet, Refills: 0    tamsulosin (FLOMAX) 0.4 MG CAPS capsule Take 1 capsule (0.4 mg total) by mouth daily. Qty: 30 capsule, Refills: 0      CONTINUE these medications which have NOT CHANGED   Details  aspirin 81 MG tablet Take 81 mg by mouth daily.     buPROPion (WELLBUTRIN XL) 300 MG 24 hr tablet Take 300 mg by mouth daily.    calcium carbonate (TUMS - DOSED IN MG ELEMENTAL CALCIUM) 500 MG chewable tablet Chew 1 tablet by mouth at bedtime.    fluticasone (FLONASE) 50 MCG/ACT nasal spray Place 2 sprays into both nostrils daily.    omeprazole (PRILOSEC) 20 MG capsule Take 20 mg by mouth daily.    vitamin E 100 UNIT capsule Take 100 Units by mouth every morning.       No Known Allergies Follow-up Information    Follow up with Ardis Hughs, MD On 08/02/2015.   Specialty:  Urology   Why:  8:30am   Contact information:   [place] [id number] [phone]        The results of significant diagnostics from this hospitalization (including imaging, microbiology, ancillary and laboratory) are listed below for reference.    Significant Diagnostic Studies: Ct Pelvis W/o & W Cm  07/24/2015   CLINICAL DATA:  Severe lower abdominal pain after prostate biopsy yesterday. History of prostate cancer.  EXAM: CT PELVIS WITH AND WITHOUT CONTRAST  TECHNIQUE: Multidetector CT imaging of the pelvis was performed following the standard protocol before and following the bolus administration of intravenous contrast.  CONTRAST:  138mL OMNIPAQUE IOHEXOL 300 MG/ML  SOLN  COMPARISON:  None.  FINDINGS: No significant osseous abnormality is noted. There is no evidence of bowel obstruction. No abnormal fluid collection is noted. Urinary bladder appears normal. No significant adenopathy is noted. Appendix appears normal. Moderately enlarged prostate gland is noted consistent with a history of prostatic carcinoma. No hematoma is noted.  IMPRESSION: Moderate prostate enlargement is noted  consistent with a history of prostate carcinoma. No hematoma or abnormal fluid collection is noted in the pelvis. No acute abnormality is noted.   Electronically Signed   By: Marijo Conception, M.D.   On: 07/24/2015 20:49   Dg Chest Port 1 View  07/25/2015   CLINICAL DATA:  Acute respiratory failure with hypoxia.  EXAM: PORTABLE CHEST - 1 VIEW  COMPARISON:  Chest radiographs obtained yesterday.  FINDINGS: Lower lung volumes from prior exam. Development of vascular congestion. Right basilar and left perihilar linear atelectasis. No confluent airspace disease. No large pleural effusion or pneumothorax.  IMPRESSION: Lower lung volumes with development of vascular congestion and scattered atelectasis.   Electronically Signed   By: Jeb Levering M.D.   On: 07/25/2015 21:36   Dg Chest Port 1 View  07/24/2015   CLINICAL DATA:  Prostate biopsy performed yesterday, now with fever.  EXAM: PORTABLE CHEST - 1 VIEW  COMPARISON:  Chest radiograph 07/13/2008.  FINDINGS: Normal cardiomediastinal silhouette. No infiltrates or failure. No effusion or pneumothorax. Negative osseous structures.  IMPRESSION: Stable chest.   Electronically Signed   By: Staci Righter M.D.   On: 07/24/2015 18:13   Dg Abd Portable 1v  07/26/2015   CLINICAL DATA:  59 year old male with abdominal pain and distention  EXAM: PORTABLE ABDOMEN - 1 VIEW  COMPARISON:  CT dated 07/24/2015  FINDINGS: The bowel gas pattern is normal. No radio-opaque calculi or other significant radiographic abnormality are seen.  IMPRESSION: No evidence of bowel obstruction.   Electronically Signed   By: Anner Crete M.D.   On: 07/26/2015 20:24    Microbiology: Recent Results (from the past 240 hour(s))  Urine culture     Status: None   Collection Time: 07/24/15  4:38 PM  Result Value Ref Range Status   Specimen Description URINE, CLEAN CATCH  Final   Special Requests NONE  Final   Culture   Final    >=100,000 COLONIES/mL ESCHERICHIA COLI Confirmed Extended  Spectrum Beta-Lactamase Producer (ESBL) Performed at [facility]    Report Status 07/26/2015 FINAL  Final   Organism ID, Bacteria ESCHERICHIA COLI  Final      Susceptibility   Escherichia coli - MIC*    AMPICILLIN >=32 RESISTANT Resistant     CEFAZOLIN >=64 RESISTANT Resistant     CEFTRIAXONE >=64 RESISTANT Resistant     CIPROFLOXACIN >=4 RESISTANT Resistant     GENTAMICIN <=1 SENSITIVE Sensitive     IMIPENEM <=0.25 SENSITIVE Sensitive     NITROFURANTOIN <=16 SENSITIVE Sensitive     TRIMETH/SULFA >=320 RESISTANT Resistant     AMPICILLIN/SULBACTAM 8 SENSITIVE Sensitive     PIP/TAZO <=4 SENSITIVE Sensitive     * >=100,000 COLONIES/mL ESCHERICHIA COLI  Blood Culture (routine x 2)     Status: None   Collection Time: 07/24/15  4:46 PM  Result Value Ref Range Status   Specimen Description BLOOD RIGHT ANTECUBITAL  Final   Special Requests BOTTLES DRAWN AEROBIC AND ANAEROBIC 5 CC EACH  Final   Culture  Setup Time   Final    GRAM NEGATIVE RODS IN BOTH AEROBIC AND ANAEROBIC BOTTLES CRITICAL RESULT CALLED TO, READ BACK BY AND VERIFIED WITH: S STOWE@0639  07/25/15 M KELLY    Culture   Final    ESCHERICHIA COLI SUSCEPTIBILITIES PERFORMED ON PREVIOUS CULTURE WITHIN THE LAST 5 DAYS. Performed at [facility]    Report Status 07/28/2015 FINAL  Final  Blood Culture (routine x 2)     Status: None   Collection Time: 07/24/15  5:19 PM  Result Value Ref Range Status   Specimen Description BLOOD RIGHT FOREARM  Final   Special Requests BOTTLES DRAWN AEROBIC AND ANAEROBIC 5CC  Final   Culture  Setup Time   Final    GRAM NEGATIVE RODS IN BOTH AEROBIC AND ANAEROBIC BOTTLES CRITICAL RESULT CALLED TO, READ BACK BY AND VERIFIED WITH: S STOWE @ [id number] 07/25/15 MKELLY    Culture   Final    ESCHERICHIA COLI Confirmed Extended Spectrum Beta-Lactamase Producer (ESBL) Performed at [facility]    Report Status 07/27/2015 FINAL  Final   Organism ID, Bacteria ESCHERICHIA COLI  Final       Susceptibility   Escherichia coli - MIC*    AMPICILLIN >=32 RESISTANT Resistant     CEFAZOLIN >=64 RESISTANT Resistant     CEFEPIME 4 RESISTANT Resistant     CEFTAZIDIME 4 RESISTANT Resistant     CEFTRIAXONE >=64 RESISTANT Resistant  CIPROFLOXACIN >=4 RESISTANT Resistant     GENTAMICIN <=1 SENSITIVE Sensitive     IMIPENEM <=0.25 SENSITIVE Sensitive     TRIMETH/SULFA >=320 RESISTANT Resistant     AMPICILLIN/SULBACTAM 8 SENSITIVE Sensitive     PIP/TAZO <=4 SENSITIVE Sensitive     * ESCHERICHIA COLI  MRSA PCR Screening     Status: None   Collection Time: 07/25/15  1:47 AM  Result Value Ref Range Status   MRSA by PCR NEGATIVE NEGATIVE Final    Comment:        The GeneXpert MRSA Assay (FDA approved for NASAL specimens only), is one component of a comprehensive MRSA colonization surveillance program. It is not intended to diagnose MRSA infection nor to guide or monitor treatment for MRSA infections.   Culture, blood (single)     Status: None (Preliminary result)   Collection Time: 07/27/15  4:21 AM  Result Value Ref Range Status   Specimen Description BLOOD RIGHT ANTECUBITAL  Final   Special Requests BOTTLES DRAWN AEROBIC AND ANAEROBIC 10CC  Final   Culture NO GROWTH 2 DAYS  Final   Report Status PENDING  Incomplete     Labs: Basic Metabolic Panel:  Recent Labs Lab 07/25/15 0327  07/27/15 0845 07/28/15 0353 07/28/15 1333 07/29/15 0357 07/30/15 0520  NA 142  < > 139 138 139 141 139  K 4.9  < > 3.8 3.8 3.9 4.1 3.9  CL 115*  < > 111 108 108 109 107  CO2 22  < > 22 24 27 27 26   GLUCOSE 111*  < > 120* 118* 122* 101* 98  BUN 11  < > 20 29* 23* 16 11  CREATININE 1.35*  < > 2.53* 4.51* 2.98* 1.37* 0.93  CALCIUM 7.4*  < > 8.7* 8.4* 8.5* 8.2* 8.5*  MG 1.4*  --   --   --   --   --   --   PHOS 2.7  --   --   --   --   --   --   < > = values in this interval not displayed. Liver Function Tests:  Recent Labs Lab 07/24/15 1703  AST 23  ALT 24  ALKPHOS 79   BILITOT 1.3*  PROT 6.8  ALBUMIN 4.2   No results for input(s): LIPASE, AMYLASE in the last 168 hours. No results for input(s): AMMONIA in the last 168 hours. CBC:  Recent Labs Lab 07/24/15 1703  07/26/15 0359 07/27/15 0845 07/28/15 0353 07/29/15 0357 07/30/15 0520  WBC 7.3  < > 9.3 6.0 6.3 4.4 6.0  NEUTROABS 7.0  --   --   --   --   --   --   HGB 15.9  < > 12.5* 12.0* 11.6* 10.6* 11.6*  HCT 46.8  < > 38.9* 36.1* 34.9* 32.4* 36.5*  MCV 91.4  < > 93.1 90.7 90.9 91.3 92.6  PLT 169  < > 105* 103* 130* 173 169  < > = values in this interval not displayed. Cardiac Enzymes: No results for input(s): CKTOTAL, CKMB, CKMBINDEX, TROPONINI in the last 168 hours. BNP: BNP (last 3 results) No results for input(s): BNP in the last 8760 hours.  ProBNP (last 3 results) No results for input(s): PROBNP in the last 8760 hours.  CBG:  Recent Labs Lab 07/25/15 0750 07/25/15 1138  GLUCAP 102* 103*       Signed:  Nita Sells  [facility] 07/30/2015, 8:13 AM

## 2015-08-01 LAB — CULTURE, BLOOD (SINGLE): Culture: NO GROWTH

## 2015-08-13 ENCOUNTER — Encounter (HOSPITAL_COMMUNITY): Payer: Self-pay | Admitting: Emergency Medicine

## 2015-08-13 ENCOUNTER — Emergency Department (HOSPITAL_COMMUNITY)
Admission: EM | Admit: 2015-08-13 | Discharge: 2015-08-13 | Disposition: A | Discharge status: Home / Self Care | Payer: Managed Care, Other (non HMO) | Attending: Emergency Medicine | Admitting: Emergency Medicine

## 2015-08-13 ENCOUNTER — Emergency Department (HOSPITAL_COMMUNITY): Payer: Managed Care, Other (non HMO)

## 2015-08-13 DIAGNOSIS — N508 Other specified disorders of male genital organs: Secondary | ICD-10-CM | POA: Diagnosis not present

## 2015-08-13 DIAGNOSIS — Z8679 Personal history of other diseases of the circulatory system: Secondary | ICD-10-CM | POA: Insufficient documentation

## 2015-08-13 DIAGNOSIS — K219 Gastro-esophageal reflux disease without esophagitis: Secondary | ICD-10-CM | POA: Insufficient documentation

## 2015-08-13 DIAGNOSIS — Z7982 Long term (current) use of aspirin: Secondary | ICD-10-CM | POA: Diagnosis not present

## 2015-08-13 DIAGNOSIS — R109 Unspecified abdominal pain: Secondary | ICD-10-CM | POA: Diagnosis present

## 2015-08-13 DIAGNOSIS — Z87891 Personal history of nicotine dependence: Secondary | ICD-10-CM | POA: Insufficient documentation

## 2015-08-13 DIAGNOSIS — Z79899 Other long term (current) drug therapy: Secondary | ICD-10-CM | POA: Diagnosis not present

## 2015-08-13 DIAGNOSIS — N12 Tubulo-interstitial nephritis, not specified as acute or chronic: Secondary | ICD-10-CM

## 2015-08-13 DIAGNOSIS — Z8659 Personal history of other mental and behavioral disorders: Secondary | ICD-10-CM | POA: Diagnosis not present

## 2015-08-13 LAB — URINALYSIS, ROUTINE W REFLEX MICROSCOPIC
Bilirubin Urine: NEGATIVE
Glucose, UA: NEGATIVE mg/dL
KETONES UR: NEGATIVE mg/dL
NITRITE: NEGATIVE
PH: 5.5 (ref 5.0–8.0)
PROTEIN: NEGATIVE mg/dL
Specific Gravity, Urine: 1.022 (ref 1.005–1.030)
Urobilinogen, UA: 0.2 mg/dL (ref 0.0–1.0)

## 2015-08-13 LAB — CBC WITH DIFFERENTIAL/PLATELET
BASOS ABS: 0 10*3/uL (ref 0.0–0.1)
Basophils Relative: 0 %
EOS PCT: 2 %
Eosinophils Absolute: 0.2 10*3/uL (ref 0.0–0.7)
HCT: 40.6 % (ref 39.0–52.0)
HEMOGLOBIN: 13.1 g/dL (ref 13.0–17.0)
LYMPHS PCT: 10 %
Lymphs Abs: 1.1 10*3/uL (ref 0.7–4.0)
MCH: 30 pg (ref 26.0–34.0)
MCHC: 32.3 g/dL (ref 30.0–36.0)
MCV: 93.1 fL (ref 78.0–100.0)
Monocytes Absolute: 0.9 10*3/uL (ref 0.1–1.0)
Monocytes Relative: 9 %
NEUTROS PCT: 79 %
Neutro Abs: 8.2 10*3/uL — ABNORMAL HIGH (ref 1.7–7.7)
PLATELETS: 370 10*3/uL (ref 150–400)
RBC: 4.36 MIL/uL (ref 4.22–5.81)
RDW: 13.1 % (ref 11.5–15.5)
WBC: 10.5 10*3/uL (ref 4.0–10.5)

## 2015-08-13 LAB — BASIC METABOLIC PANEL
ANION GAP: 7 (ref 5–15)
BUN: 16 mg/dL (ref 6–20)
CALCIUM: 9.2 mg/dL (ref 8.9–10.3)
CO2: 26 mmol/L (ref 22–32)
Chloride: 108 mmol/L (ref 101–111)
Creatinine, Ser: 1.01 mg/dL (ref 0.61–1.24)
Glucose, Bld: 119 mg/dL — ABNORMAL HIGH (ref 65–99)
POTASSIUM: 4 mmol/L (ref 3.5–5.1)
Sodium: 141 mmol/L (ref 135–145)

## 2015-08-13 LAB — URINE MICROSCOPIC-ADD ON

## 2015-08-13 MED ORDER — HYDROMORPHONE HCL 1 MG/ML IJ SOLN
1.0000 mg | Freq: Once | INTRAMUSCULAR | Status: AC
  Administered 2015-08-13: 1 mg via INTRAVENOUS
  Filled 2015-08-13: qty 1

## 2015-08-13 MED ORDER — SODIUM CHLORIDE 0.9 % IV BOLUS (SEPSIS)
500.0000 mL | Freq: Once | INTRAVENOUS | Status: AC
  Administered 2015-08-13: 500 mL via INTRAVENOUS

## 2015-08-13 NOTE — ED Notes (Signed)
Patient transported to CT 

## 2015-08-13 NOTE — ED Notes (Signed)
Pt states he has a pain in his right flank area  Pt states he had a new foley cath placed on Wednesday at [facility]  Pt states he had complications for a prostate biopsy  Pt states since then he has been having catheters  Pt is currently on Augmentin and tamsulosin

## 2015-08-13 NOTE — ED Provider Notes (Signed)
This patient's care was assumed from Charlann Lange, PA-C at shift change. Please see her note for further.  Patient presented with right flank pain since last night. Patient has a history of prostate biopsy by Dr. Louis Meckel and has foley cath in place. Patient's urinalysis indicated moderate hemoglobin with moderate leukocytes in 7-10 white blood cells. Urine culture is pending. At time of shift change the patient is awaiting a CT renal stone study. CT renal stone study indicated no acute abnormality. There is no evidence of nephroureteral lithiasis and the appendix is normal. I suspect pyelonephritis. The patient reports he is currently on Augmentin, will continue Augmentin and await for urine culture and have him follow up with urologist Dr. Avanell Shackleton. The patient also has percocet at home and I advised he can continue to take this as needed for pain. I advised the patient to follow-up with their primary care provider this week. I advised the patient to return to the emergency department with new or worsening symptoms or new concerns. The patient verbalized understanding and agreement with plan.    Diagnosis:  #1: Pyelonephritis.   This patient was discussed with and evaluated by Dr. Sabra Heck who agrees with assessment and plan.   Results for orders placed or performed during the hospital encounter of 08/13/15  CBC with Differential  Result Value Ref Range   WBC 10.5 4.0 - 10.5 K/uL   RBC 4.36 4.22 - 5.81 MIL/uL   Hemoglobin 13.1 13.0 - 17.0 g/dL   HCT 40.6 39.0 - 52.0 %   MCV 93.1 78.0 - 100.0 fL   MCH 30.0 26.0 - 34.0 pg   MCHC 32.3 30.0 - 36.0 g/dL   RDW 13.1 11.5 - 15.5 %   Platelets 370 150 - 400 K/uL   Neutrophils Relative % 79 %   Neutro Abs 8.2 (H) 1.7 - 7.7 K/uL   Lymphocytes Relative 10 %   Lymphs Abs 1.1 0.7 - 4.0 K/uL   Monocytes Relative 9 %   Monocytes Absolute 0.9 0.1 - 1.0 K/uL   Eosinophils Relative 2 %   Eosinophils Absolute 0.2 0.0 - 0.7 K/uL   Basophils Relative 0 %    Basophils Absolute 0.0 0.0 - 0.1 K/uL  Urinalysis, Routine w reflex microscopic  Result Value Ref Range   Color, Urine YELLOW YELLOW   APPearance CLOUDY (A) CLEAR   Specific Gravity, Urine 1.022 1.005 - 1.030   pH 5.5 5.0 - 8.0   Glucose, UA NEGATIVE NEGATIVE mg/dL   Hgb urine dipstick MODERATE (A) NEGATIVE   Bilirubin Urine NEGATIVE NEGATIVE   Ketones, ur NEGATIVE NEGATIVE mg/dL   Protein, ur NEGATIVE NEGATIVE mg/dL   Urobilinogen, UA 0.2 0.0 - 1.0 mg/dL   Nitrite NEGATIVE NEGATIVE   Leukocytes, UA MODERATE (A) NEGATIVE  Basic metabolic panel  Result Value Ref Range   Sodium 141 135 - 145 mmol/L   Potassium 4.0 3.5 - 5.1 mmol/L   Chloride 108 101 - 111 mmol/L   CO2 26 22 - 32 mmol/L   Glucose, Bld 119 (H) 65 - 99 mg/dL   BUN 16 6 - 20 mg/dL   Creatinine, Ser 1.01 0.61 - 1.24 mg/dL   Calcium 9.2 8.9 - 10.3 mg/dL   GFR calc non Af Amer >60 >60 mL/min   GFR calc Af Amer >60 >60 mL/min   Anion gap 7 5 - 15  Urine microscopic-add on  Result Value Ref Range   WBC, UA 7-10 <3 WBC/hpf   RBC / HPF 11-20 <  3 RBC/hpf   Bacteria, UA FEW (A) RARE   Urine-Other MUCOUS PRESENT    Ct Pelvis W/o & W Cm  07/24/2015   CLINICAL DATA:  Severe lower abdominal pain after prostate biopsy yesterday. History of prostate cancer.  EXAM: CT PELVIS WITH AND WITHOUT CONTRAST  TECHNIQUE: Multidetector CT imaging of the pelvis was performed following the standard protocol before and following the bolus administration of intravenous contrast.  CONTRAST:  145mL OMNIPAQUE IOHEXOL 300 MG/ML  SOLN  COMPARISON:  None.  FINDINGS: No significant osseous abnormality is noted. There is no evidence of bowel obstruction. No abnormal fluid collection is noted. Urinary bladder appears normal. No significant adenopathy is noted. Appendix appears normal. Moderately enlarged prostate gland is noted consistent with a history of prostatic carcinoma. No hematoma is noted.  IMPRESSION: Moderate prostate enlargement is noted  consistent with a history of prostate carcinoma. No hematoma or abnormal fluid collection is noted in the pelvis. No acute abnormality is noted.   Electronically Signed   By: Marijo Conception, M.D.   On: 07/24/2015 20:49   Dg Chest Port 1 View  07/25/2015   CLINICAL DATA:  Acute respiratory failure with hypoxia.  EXAM: PORTABLE CHEST - 1 VIEW  COMPARISON:  Chest radiographs obtained yesterday.  FINDINGS: Lower lung volumes from prior exam. Development of vascular congestion. Right basilar and left perihilar linear atelectasis. No confluent airspace disease. No large pleural effusion or pneumothorax.  IMPRESSION: Lower lung volumes with development of vascular congestion and scattered atelectasis.   Electronically Signed   By: Jeb Levering M.D.   On: 07/25/2015 21:36   Dg Chest Port 1 View  07/24/2015   CLINICAL DATA:  Prostate biopsy performed yesterday, now with fever.  EXAM: PORTABLE CHEST - 1 VIEW  COMPARISON:  Chest radiograph 07/13/2008.  FINDINGS: Normal cardiomediastinal silhouette. No infiltrates or failure. No effusion or pneumothorax. Negative osseous structures.  IMPRESSION: Stable chest.   Electronically Signed   By: Staci Righter M.D.   On: 07/24/2015 18:13   Dg Abd Portable 1v  07/26/2015   CLINICAL DATA:  59 year old male with abdominal pain and distention  EXAM: PORTABLE ABDOMEN - 1 VIEW  COMPARISON:  CT dated 07/24/2015  FINDINGS: The bowel gas pattern is normal. No radio-opaque calculi or other significant radiographic abnormality are seen.  IMPRESSION: No evidence of bowel obstruction.   Electronically Signed   By: Anner Crete M.D.   On: 07/26/2015 20:24   Ct Renal Stone Study  08/13/2015   CLINICAL DATA:  59 year old male with right flank pain radiating into the right lower quadrant. Currently has a Foley catheter in place secondary to neurogenic bladder following complicated prostate biopsy.  EXAM: CT ABDOMEN AND PELVIS WITHOUT CONTRAST  TECHNIQUE: Multidetector CT imaging of  the abdomen and pelvis was performed following the standard protocol without IV contrast.  COMPARISON:  CT scan of the pelvis 07/24/2015  FINDINGS: Lower Chest: The lung bases are clear. Visualized cardiac structures are within normal limits for size. No pericardial effusion. Unremarkable visualized distal thoracic esophagus.  Abdomen: Unenhanced CT was performed per clinician order. Lack of IV contrast limits sensitivity and specificity, especially for evaluation of abdominal/pelvic solid viscera. Within these limitations, unremarkable CT appearance of the stomach, duodenum, spleen, adrenal glands and pancreas. Normal hepatic contour and morphology. No discrete hepatic lesion. Gallbladder is unremarkable. No intra or extrahepatic biliary ductal dilatation.  Unremarkable appearance of the bilateral kidneys. No focal solid lesion, hydronephrosis or nephrolithiasis.  No evidence of obstruction  or focal bowel wall thickening. Normal appendix in the right lower quadrant. The terminal ileum is unremarkable. Mild fecalization in the terminal ileum without evidence of wall thickening or inflammation. This finding is nonspecific and likely incidental. No evidence of free fluid, mesenteric edema or suspicious adenopathy.  Pelvis: Foley catheter present within the collapsed bladder. The prostate gland is enlarged. No free fluid or suspicious adenopathy.  Bones/Soft Tissues: No acute fracture or aggressive appearing lytic or blastic osseous lesion.  Vascular: Limited evaluation in the absence of intravenous contrast. No evidence of aneurysm. Mild scattered atherosclerotic vascular calcifications.  IMPRESSION: 1. No abnormality identified to explain the patient's clinical symptoms. Specifically, no evidence of nephro or ureterolithiasis an the appendix is normal. 2. Mild atherosclerotic vascular calcifications without evidence of aneurysm. 3. Foley catheter present within the collapsed bladder. The bladder wall appears  diffusely thickened. 4. Prostatomegaly.   Electronically Signed   By: Jacqulynn Cadet M.D.   On: 08/13/2015 07:49      Waynetta Pean, PA-C 08/13/15 2060  Noemi Chapel, MD 08/13/15 1540

## 2015-08-13 NOTE — Discharge Instructions (Signed)
Pyelonephritis, Adult °Pyelonephritis is a kidney infection. In general, there are 2 main types of pyelonephritis: °· Infections that come on quickly without any warning (acute pyelonephritis). °· Infections that persist for a long period of time (chronic pyelonephritis). °CAUSES  °Two main causes of pyelonephritis are: °· Bacteria traveling from the bladder to the kidney. This is a problem especially in pregnant women. The urine in the bladder can become filled with bacteria from multiple causes, including: °¨ Inflammation of the prostate gland (prostatitis). °¨ Sexual intercourse in females. °¨ Bladder infection (cystitis). °· Bacteria traveling from the bloodstream to the tissue part of the kidney. °Problems that may increase your risk of getting a kidney infection include: °· Diabetes. °· Kidney stones or bladder stones. °· Cancer. °· Catheters placed in the bladder. °· Other abnormalities of the kidney or ureter. °SYMPTOMS  °· Abdominal pain. °· Pain in the side or flank area. °· Fever. °· Chills. °· Upset stomach. °· Blood in the urine (dark urine). °· Frequent urination. °· Strong or persistent urge to urinate. °· Burning or stinging when urinating. °DIAGNOSIS  °Your caregiver may diagnose your kidney infection based on your symptoms. A urine sample may also be taken. °TREATMENT  °In general, treatment depends on how severe the infection is.  °· If the infection is mild and caught early, your caregiver may treat you with oral antibiotics and send you home. °· If the infection is more severe, the bacteria may have gotten into the bloodstream. This will require intravenous (IV) antibiotics and a hospital stay. Symptoms may include: °¨ High fever. °¨ Severe flank pain. °¨ Shaking chills. °· Even after a hospital stay, your caregiver may require you to be on oral antibiotics for a period of time. °· Other treatments may be required depending upon the cause of the infection. °HOME CARE INSTRUCTIONS  °· Take your  antibiotics as directed. Finish them even if you start to feel better. °· Make an appointment to have your urine checked to make sure the infection is gone. °· Drink enough fluids to keep your urine clear or pale yellow. °· Take medicines for the bladder if you have urgency and frequency of urination as directed by your caregiver. °SEEK IMMEDIATE MEDICAL CARE IF:  °· You have a fever or persistent symptoms for more than 2-3 days. °· You have a fever and your symptoms suddenly get worse. °· You are unable to take your antibiotics or fluids. °· You develop shaking chills. °· You experience extreme weakness or fainting. °· There is no improvement after 2 days of treatment. °MAKE SURE YOU: °· Understand these instructions. °· Will watch your condition. °· Will get help right away if you are not doing well or get worse. °Document Released: 10/30/2005 Document Revised: 04/30/2012 Document Reviewed: 04/05/2011 °[employer]® Patient Information ©2015 [employer]. This information is not intended to replace advice given to you by your health care provider. Make sure you discuss any questions you have with your health care provider. ° °

## 2015-08-13 NOTE — ED Provider Notes (Signed)
CSN: [id number]     Arrival date & time 08/13/15  0455 History   First MD Initiated Contact with Patient 08/13/15 [phone]     Chief Complaint  Patient presents with  . Flank Pain     (Consider location/radiation/quality/duration/timing/severity/associated sxs/prior Treatment) Patient is a 59 y.o. male presenting with flank pain. The history is provided by the patient. No language interpreter was used.  Flank Pain This is a new problem. The current episode started yesterday. The problem occurs constantly. The problem has been gradually worsening. Pertinent negatives include no chills or fever. Associated symptoms comments: Here with complaint of right flank pain that started last night around 11:00 pm and that has kept him awake through the night. No fever, nausea or vomiting. He has a recent history of neurogenic bladder after a complicated prostatic biopsy and continues to have a foley catheter in place. The pain radiates into the RLQ and testicular area. His foley leg bag continues to collect urine that is not cloudy or bloody. He has oxycodone at home which he took without relief. .    Past Medical History  Diagnosis Date  . Acid reflux   . Migraine   . Depression   . Anxiety    Past Surgical History  Procedure Laterality Date  . Biopsy prostate     Family History  Problem Relation Age of Onset  . Cancer Father   . Cancer Brother    Social History  Substance Use Topics  . Smoking status: Former Research scientist (life sciences)  . Smokeless tobacco: None  . Alcohol Use: Yes    Review of Systems  Constitutional: Negative for fever and chills.  Respiratory: Negative.   Cardiovascular: Negative.   Gastrointestinal: Negative.   Genitourinary: Positive for flank pain and testicular pain. Negative for hematuria, decreased urine volume and scrotal swelling.  Musculoskeletal: Negative.   Skin: Negative.   Neurological: Negative.       Allergies  Review of patient's allergies indicates no known  allergies.  Home Medications   Prior to Admission medications   Medication Sig Start Date End Date Taking? Authorizing Provider  acetaminophen (TYLENOL) 500 MG tablet Take 1,000 mg by mouth every 6 (six) hours as needed for mild pain.   Yes Historical Provider, MD  acidophilus (RISAQUAD) CAPS capsule Take 1 capsule by mouth daily.   Yes Historical Provider, MD  amoxicillin-clavulanate (AUGMENTIN) 875-125 MG per tablet Take 1 tablet by mouth every 12 (twelve) hours. 07/30/15  Yes Nita Sells, MD  aspirin 81 MG tablet Take 81 mg by mouth daily.    Yes Historical Provider, MD  calcium carbonate (TUMS - DOSED IN MG ELEMENTAL CALCIUM) 500 MG chewable tablet Chew 1 tablet by mouth at bedtime.   Yes Historical Provider, MD  ibuprofen (ADVIL,MOTRIN) 200 MG tablet Take 200 mg by mouth every 6 (six) hours as needed for moderate pain.   Yes Historical Provider, MD  tamsulosin (FLOMAX) 0.4 MG CAPS capsule Take 1 capsule (0.4 mg total) by mouth daily. 07/30/15  Yes Nita Sells, MD   BP 146/81 mmHg  Pulse 76  Temp(Src) 97.9 F (36.6 C) (Oral)  Resp 20  SpO2 98% Physical Exam  Constitutional: He is oriented to person, place, and time. He appears well-developed and well-nourished.  Neck: Normal range of motion.  Pulmonary/Chest: Effort normal.  Abdominal: Soft. There is no tenderness.  Genitourinary:  No CVA tenderness.  Musculoskeletal: Normal range of motion.  Neurological: He is alert and oriented to person, place, and time.  Skin:  Skin is warm and dry.  Psychiatric: He has a normal mood and affect.    ED Course  Procedures (including critical care time) Labs Review Labs Reviewed  URINE CULTURE  CBC WITH DIFFERENTIAL/PLATELET  URINALYSIS, ROUTINE W REFLEX MICROSCOPIC (NOT AT [facility])  BASIC METABOLIC PANEL   Results for orders placed or performed during the hospital encounter of 08/13/15  CBC with Differential  Result Value Ref Range   WBC 10.5 4.0 - 10.5 K/uL   RBC 4.36  4.22 - 5.81 MIL/uL   Hemoglobin 13.1 13.0 - 17.0 g/dL   HCT 40.6 39.0 - 52.0 %   MCV 93.1 78.0 - 100.0 fL   MCH 30.0 26.0 - 34.0 pg   MCHC 32.3 30.0 - 36.0 g/dL   RDW 13.1 11.5 - 15.5 %   Platelets 370 150 - 400 K/uL   Neutrophils Relative % 79 %   Neutro Abs 8.2 (H) 1.7 - 7.7 K/uL   Lymphocytes Relative 10 %   Lymphs Abs 1.1 0.7 - 4.0 K/uL   Monocytes Relative 9 %   Monocytes Absolute 0.9 0.1 - 1.0 K/uL   Eosinophils Relative 2 %   Eosinophils Absolute 0.2 0.0 - 0.7 K/uL   Basophils Relative 0 %   Basophils Absolute 0.0 0.0 - 0.1 K/uL  Urinalysis, Routine w reflex microscopic  Result Value Ref Range   Color, Urine YELLOW YELLOW   APPearance CLOUDY (A) CLEAR   Specific Gravity, Urine 1.022 1.005 - 1.030   pH 5.5 5.0 - 8.0   Glucose, UA NEGATIVE NEGATIVE mg/dL   Hgb urine dipstick MODERATE (A) NEGATIVE   Bilirubin Urine NEGATIVE NEGATIVE   Ketones, ur NEGATIVE NEGATIVE mg/dL   Protein, ur NEGATIVE NEGATIVE mg/dL   Urobilinogen, UA 0.2 0.0 - 1.0 mg/dL   Nitrite NEGATIVE NEGATIVE   Leukocytes, UA MODERATE (A) NEGATIVE  Basic metabolic panel  Result Value Ref Range   Sodium 141 135 - 145 mmol/L   Potassium 4.0 3.5 - 5.1 mmol/L   Chloride 108 101 - 111 mmol/L   CO2 26 22 - 32 mmol/L   Glucose, Bld 119 (H) 65 - 99 mg/dL   BUN 16 6 - 20 mg/dL   Creatinine, Ser 1.01 0.61 - 1.24 mg/dL   Calcium 9.2 8.9 - 10.3 mg/dL   GFR calc non Af Amer >60 >60 mL/min   GFR calc Af Amer >60 >60 mL/min   Anion gap 7 5 - 15  Urine microscopic-add on  Result Value Ref Range   WBC, UA 7-10 <3 WBC/hpf   RBC / HPF 11-20 <3 RBC/hpf   Bacteria, UA FEW (A) RARE   Urine-Other MUCOUS PRESENT     Imaging Review No results found. I have personally reviewed and evaluated these images and lab results as part of my medical decision-making.   EKG Interpretation None      MDM   Final diagnoses:  None    1. Flank pain  Patient care signed out to Will Dansie, PA-C, at end of shift.      Charlann Lange, PA-C 08/13/15 [id number]  Noemi Chapel, MD 08/13/15 1540

## 2015-08-14 LAB — URINE CULTURE: Culture: NO GROWTH

## 2015-10-30 ENCOUNTER — Other Ambulatory Visit: Payer: Self-pay | Admitting: Urology

## 2015-10-30 MED ORDER — SULFAMETHOXAZOLE-TRIMETHOPRIM 800-160 MG PO TABS: 1.0000 | ORAL_TABLET | Freq: Two times a day (BID) | ORAL | Status: DC

## 2015-11-02 ENCOUNTER — Encounter: Payer: Self-pay | Admitting: Internal Medicine

## 2015-11-02 ENCOUNTER — Ambulatory Visit (INDEPENDENT_AMBULATORY_CARE_PROVIDER_SITE_OTHER): Payer: BLUE CROSS/BLUE SHIELD | Admitting: Internal Medicine

## 2015-11-02 VITALS — BP 148/100 | HR 80 | Temp 98.8°F | Wt 235.0 lb

## 2015-11-02 DIAGNOSIS — B9620 Unspecified Escherichia coli [E. coli] as the cause of diseases classified elsewhere: Secondary | ICD-10-CM

## 2015-11-02 DIAGNOSIS — N390 Urinary tract infection, site not specified: Secondary | ICD-10-CM | POA: Diagnosis not present

## 2015-11-02 DIAGNOSIS — Z8619 Personal history of other infectious and parasitic diseases: Secondary | ICD-10-CM

## 2015-11-02 MED ORDER — FOSFOMYCIN TROMETHAMINE 3 G PO PACK: 3.0000 g | PACK | Freq: Once | ORAL | Status: DC

## 2015-11-02 NOTE — Progress Notes (Signed)
Rfv: community referral for recurrent uti Subjective:    Patient ID: Craig Butler, male    DOB: [birth year], 59 y.o.   MRN: [id number]  HPI Mr Vallis is a 59yo M with prostratitis, x of prostrate abscess, elevated PSA with actve surveillance, prostrate ca stage T1c. , urinary obstruction requiring intermittent catheterization, recurrent uti. Referred by Dr. Louis Meckel his urologist. His most recent hx includes being admitted in early September for prostate biopsy but developed ESBL Ecoli sepsis. He was discharged from home with foley catheter at that time which led him to require to do self intermittent catheterization. He has developed recurrent uti, at one time treated with fosfomycin since he developed rash to amox/clav. He was last seen at end of November by his urologist to evaluate prostatitis and prostate abscess. By transrectal u/s the fluid collections had largely resolved. He last took fosfomycin on 11/29 then roughly on dec 4-5-th started to have recurrence of  Dysuria, hematuria and now on bactrim based on Dec 5th urine cx (esbl ecoli that is now sensitive for bactrim). He states that his symptoms are nearly resolved though still feels not back at his baseline.  I have reviewed urologist notes for the last 3 months  Pertinent micro data: ESBL ecoli Amox/clav S, amp/sub S, imi S, piptazo S, FQ R, bactrim R, nitrofurantoin S  No Known Allergies Current Outpatient Prescriptions on File Prior to Visit  Medication Sig Dispense Refill  . acetaminophen (TYLENOL) 500 MG tablet Take 1,000 mg by mouth every 6 (six) hours as needed for mild pain.    Marland Kitchen acidophilus (RISAQUAD) CAPS capsule Take 1 capsule by mouth daily.    Marland Kitchen aspirin 81 MG tablet Take 81 mg by mouth daily.     . calcium carbonate (TUMS - DOSED IN MG ELEMENTAL CALCIUM) 500 MG chewable tablet Chew 1 tablet by mouth at bedtime.    Marland Kitchen ibuprofen (ADVIL,MOTRIN) 200 MG tablet Take 200 mg by mouth every 6 (six) hours as needed for moderate  pain.    Marland Kitchen sulfamethoxazole-trimethoprim (BACTRIM DS,SEPTRA DS) 800-160 MG tablet Take 1 tablet by mouth 2 (two) times daily. 14 tablet 0  . tamsulosin (FLOMAX) 0.4 MG CAPS capsule Take 1 capsule (0.4 mg total) by mouth daily. 30 capsule 0  . amoxicillin-clavulanate (AUGMENTIN) 875-125 MG per tablet Take 1 tablet by mouth every 12 (twelve) hours. (Patient not taking: Reported on 11/02/2015) 44 tablet 0   No current facility-administered medications on file prior to visit.   Active Ambulatory Problems    Diagnosis Date Noted  . Acid reflux   . Migraine   . Sepsis ([place]) 07/24/2015  . UTI (lower urinary tract infection) 07/24/2015  . Depression   . Anxiety   . H/O prostate biopsy   . AKI (acute kidney injury) ([place]) 07/24/2015  . Acute urinary retention 07/26/2015  . Hematuria 07/26/2015  . Constipation 07/26/2015   Resolved Ambulatory Problems    Diagnosis Date Noted  . No Resolved Ambulatory Problems   No Additional Past Medical History   Social History  Substance Use Topics  . Smoking status: Former Research scientist (life sciences)  . Smokeless tobacco: None  . Alcohol Use: Yes  family history includes Cancer in his brother and father.   Review of Systems Review of Systems  Constitutional: Negative for fever, chills, diaphoresis, activity change, appetite change, fatigue and unexpected weight change.  HENT: Negative for congestion, sore throat, rhinorrhea, sneezing, trouble swallowing and sinus pressure.  Eyes: Negative for photophobia and visual disturbance.  Respiratory:  Negative for cough, chest tightness, shortness of breath, wheezing and stridor.  Cardiovascular: Negative for chest pain, palpitations and leg swelling.  Gastrointestinal: Negative for nausea, vomiting, abdominal pain, diarrhea, constipation, blood in stool, abdominal distention and anal bleeding.  Genitourinary: positive for dysuria, but negative for hematuria, flank pain and difficulty urinating.  Musculoskeletal: Negative for  myalgias, back pain, joint swelling, arthralgias and gait problem.  Skin: Negative for color change, pallor, rash and wound.  Neurological: Negative for dizziness, tremors, weakness and light-headedness.  Hematological: Negative for adenopathy. Does not bruise/bleed easily.  Psychiatric/Behavioral: Negative for behavioral problems, confusion, sleep disturbance, dysphoric mood, decreased concentration and agitation.       Objective:   Physical Exam BP 148/100 mmHg  Pulse 80  Temp(Src) 98.8 F (37.1 C) (Oral)  Wt 235 lb (106.595 kg) Physical Exam  Constitutional: He is oriented to person, place, and time. He appears well-developed and well-nourished. No distress.  HENT:  Mouth/Throat: Oropharynx is clear and moist. No oropharyngeal exudate.  Cardiovascular: Normal rate, regular rhythm and normal heart sounds. Exam reveals no gallop and no friction rub.  No murmur heard.  Pulmonary/Chest: Effort normal and breath sounds normal. No respiratory distress. He has no wheezes.  Abdominal: Soft. Bowel sounds are normal. He exhibits no distension. There is no tenderness.  Lymphadenopathy:  He has no cervical adenopathy.  Neurological: He is alert and oriented to person, place, and time.  Skin: Skin is warm and dry. No rash noted. No erythema.  Psychiatric: He has a normal mood and affect. His behavior is normal.          Assessment & Plan:  Recurrent ESBL ecoli complicated uti, hx of prostatic abscess =Will switch to fosfomycin until can get picc line in place.  Plan to treat with IV therapy with ertapenem x 14d, will get picc line early next week

## 2015-11-09 ENCOUNTER — Telehealth: Payer: Self-pay | Admitting: *Deleted

## 2015-11-09 DIAGNOSIS — N390 Urinary tract infection, site not specified: Secondary | ICD-10-CM | POA: Insufficient documentation

## 2015-11-09 DIAGNOSIS — Z8619 Personal history of other infectious and parasitic diseases: Secondary | ICD-10-CM | POA: Insufficient documentation

## 2015-11-09 NOTE — Telephone Encounter (Signed)
Called the patient to advise that his PICC appt is set for 11/10/15 at 12 noon and he needs to arrive at 1130 at [facility] admitting.

## 2015-11-10 ENCOUNTER — Telehealth: Payer: Self-pay | Admitting: *Deleted

## 2015-11-10 ENCOUNTER — Ambulatory Visit (HOSPITAL_COMMUNITY)
Admission: RE | Admit: 2015-11-10 | Discharge: 2015-11-10 | Disposition: A | Discharge status: Home / Self Care | Payer: Managed Care, Other (non HMO) | Source: Ambulatory Visit | Attending: Internal Medicine | Admitting: Internal Medicine

## 2015-11-10 ENCOUNTER — Other Ambulatory Visit: Payer: Self-pay | Admitting: Internal Medicine

## 2015-11-10 DIAGNOSIS — B9620 Unspecified Escherichia coli [E. coli] as the cause of diseases classified elsewhere: Secondary | ICD-10-CM

## 2015-11-10 DIAGNOSIS — N390 Urinary tract infection, site not specified: Principal | ICD-10-CM

## 2015-11-10 DIAGNOSIS — I878 Other specified disorders of veins: Secondary | ICD-10-CM | POA: Insufficient documentation

## 2015-11-10 MED ORDER — LIDOCAINE HCL 1 % IJ SOLN
INTRAMUSCULAR | Status: AC
  Filled 2015-11-10: qty 20

## 2015-11-10 MED ORDER — HEPARIN SOD (PORK) LOCK FLUSH 100 UNIT/ML IV SOLN
INTRAVENOUS | Status: AC
  Filled 2015-11-10: qty 5

## 2015-11-10 NOTE — Telephone Encounter (Signed)
[facility] called to advise that the patient did not get his 1st dose of antibiiotics as he was supposed to. They are sending out an anaphalaxis kit so they can give him the medication.

## 2015-11-10 NOTE — Procedures (Signed)
Successful placement of right basilic vein approach 40 cm single lumen PICC line with tip at the superior caval-atrial junction.   The PICC line is ready for immediate use.  Ronny Bacon, MD Pager #: [phone]

## 2015-11-17 ENCOUNTER — Telehealth: Payer: Self-pay | Admitting: *Deleted

## 2015-11-17 DIAGNOSIS — R399 Unspecified symptoms and signs involving the genitourinary system: Secondary | ICD-10-CM

## 2015-11-17 NOTE — Telephone Encounter (Signed)
Pt reports, "Burning" when he begins to urinate.  He is wondering whether he needs to have lab work.  Started IV ABX 11/10/15.   MD please advise.  Will page the MD.

## 2015-11-17 NOTE — Telephone Encounter (Signed)
Order received for UA and culture from Dr. Baxter Flattery.  Left message for pt to call [facility] to make Lab appt.

## 2015-11-18 ENCOUNTER — Other Ambulatory Visit: Payer: Managed Care, Other (non HMO)

## 2015-11-18 DIAGNOSIS — R399 Unspecified symptoms and signs involving the genitourinary system: Secondary | ICD-10-CM

## 2015-11-19 LAB — URINALYSIS, ROUTINE W REFLEX MICROSCOPIC
Bilirubin Urine: NEGATIVE
Glucose, UA: NEGATIVE
Hgb urine dipstick: NEGATIVE
KETONES UR: NEGATIVE
LEUKOCYTES UA: NEGATIVE
NITRITE: NEGATIVE
PH: 6 (ref 5.0–8.0)
Protein, ur: NEGATIVE
SPECIFIC GRAVITY, URINE: 1.017 (ref 1.001–1.035)

## 2015-11-19 LAB — URINE CULTURE
Colony Count: NO GROWTH
ORGANISM ID, BACTERIA: NO GROWTH

## 2015-11-23 ENCOUNTER — Telehealth: Payer: Self-pay | Admitting: *Deleted

## 2015-11-23 ENCOUNTER — Ambulatory Visit: Payer: Managed Care, Other (non HMO) | Admitting: Infectious Disease

## 2015-11-23 NOTE — Telephone Encounter (Signed)
Patient will finish his IV antibiotics tomorrow and wants to know what the plan is. Should patient's picc line be removed? Myrtis Hopping

## 2015-11-24 ENCOUNTER — Telehealth: Payer: Self-pay

## 2015-11-24 NOTE — Telephone Encounter (Signed)
[facility] need orders for IV Antibiotics to be discontinued and PICC pulled since patient has completed 14 day course ordered at last office visit.  Verbal order given ok to pull PICC per office notes on 11-02-15 visit.     Laverle Patter, RN

## 2015-11-25 NOTE — Telephone Encounter (Signed)
Please have [facility] pull picc line. He was being treated for complicated uti/prostatitis

## 2015-11-26 NOTE — Telephone Encounter (Signed)
[facility] has order to pull patient's picc line.

## 2016-08-09 ENCOUNTER — Other Ambulatory Visit: Payer: Self-pay | Admitting: Urology

## 2016-08-09 ENCOUNTER — Other Ambulatory Visit: Payer: Self-pay | Admitting: Periodontics

## 2016-08-09 ENCOUNTER — Other Ambulatory Visit: Payer: Self-pay | Admitting: General Practice

## 2016-08-09 DIAGNOSIS — R198 Other specified symptoms and signs involving the digestive system and abdomen: Secondary | ICD-10-CM

## 2016-08-15 ENCOUNTER — Other Ambulatory Visit: Payer: BLUE CROSS/BLUE SHIELD

## 2016-08-16 ENCOUNTER — Other Ambulatory Visit: Payer: BLUE CROSS/BLUE SHIELD

## 2016-08-16 ENCOUNTER — Ambulatory Visit
Admission: RE | Admit: 2016-08-16 | Discharge: 2016-08-16 | Disposition: A | Discharge status: Home / Self Care | Payer: BLUE CROSS/BLUE SHIELD | Source: Ambulatory Visit | Attending: Urology | Admitting: Urology

## 2016-08-16 DIAGNOSIS — R198 Other specified symptoms and signs involving the digestive system and abdomen: Secondary | ICD-10-CM

## 2016-08-16 MED ORDER — IOPAMIDOL (ISOVUE-300) INJECTION 61%
100.0000 mL | Freq: Once | INTRAVENOUS | Status: AC | PRN
  Administered 2016-08-16: 100 mL via INTRAVENOUS

## 2017-01-29 IMAGING — CT CT PELVIS WO/W CM
2 of 4 series · 16 of 46 positions shown, 18 images · IV contrast (omnipaque)
Comparison: None.

CLINICAL DATA: Severe lower abdominal pain after prostate biopsy
yesterday. History of prostate cancer.

EXAM:
CT PELVIS WITH AND WITHOUT CONTRAST
TECHNIQUE: Multidetector CT imaging of the pelvis was performed following the
standard protocol before and following the bolus administration of
intravenous contrast.
CONTRAST:  100mL OMNIPAQUE IOHEXOL 300 MG/ML  SOLN

[Series 2: pelvis w.o · axial · 0.74mm/px · z∈[+1087,+1322]mm · 13 of 55 slices shown, 15 images]
[im 4/55  soft-tissue]
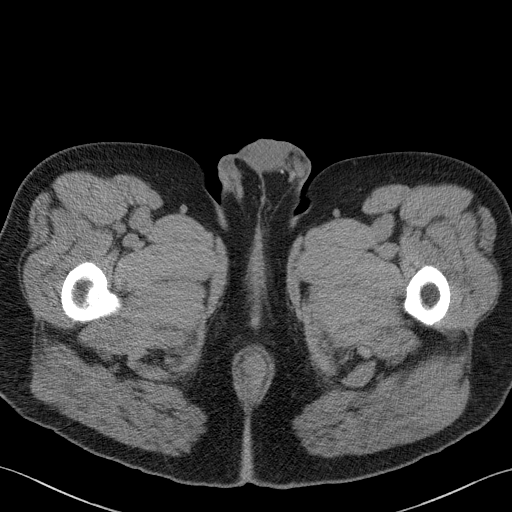
[im 4/55  bone]
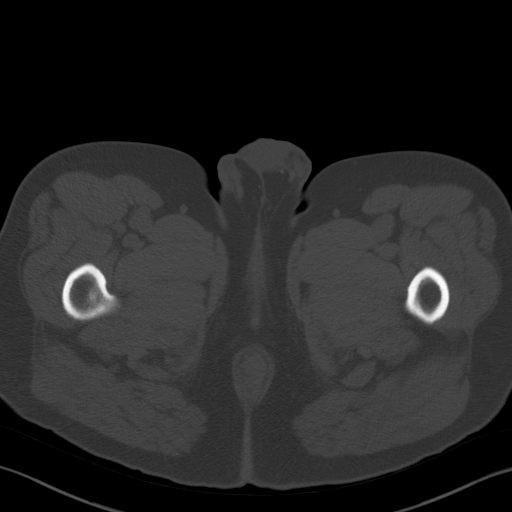
[im 8/55  soft-tissue]
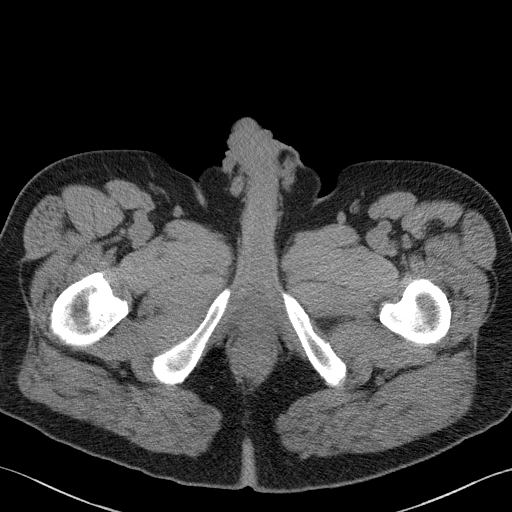
[im 11/55  soft-tissue]
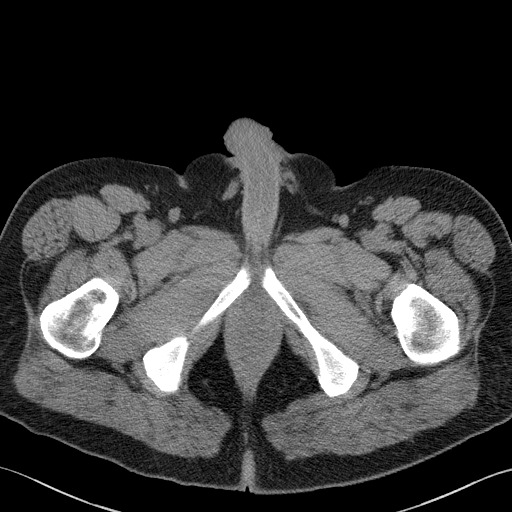
[im 15/55  soft-tissue]
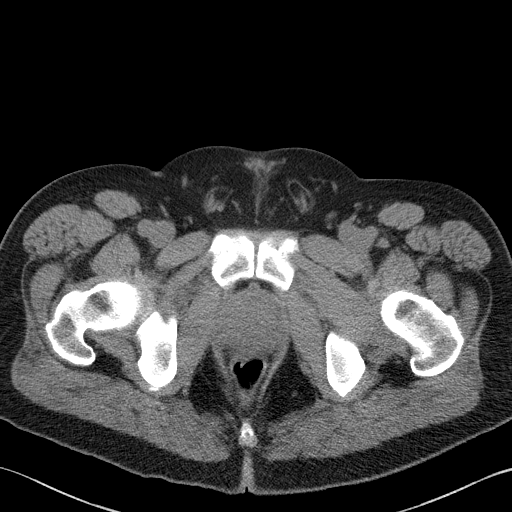
[im 19/55  soft-tissue]
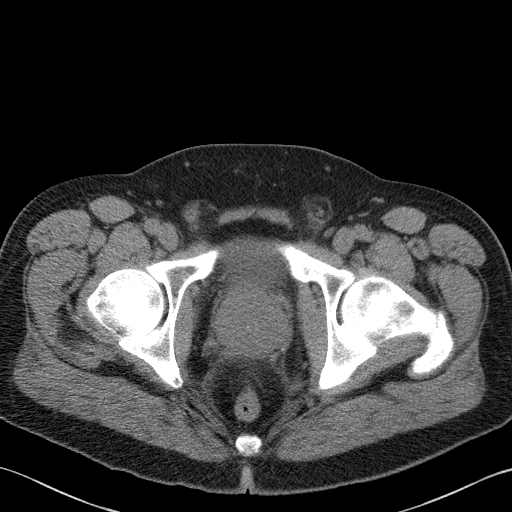
[im 22/55  soft-tissue]
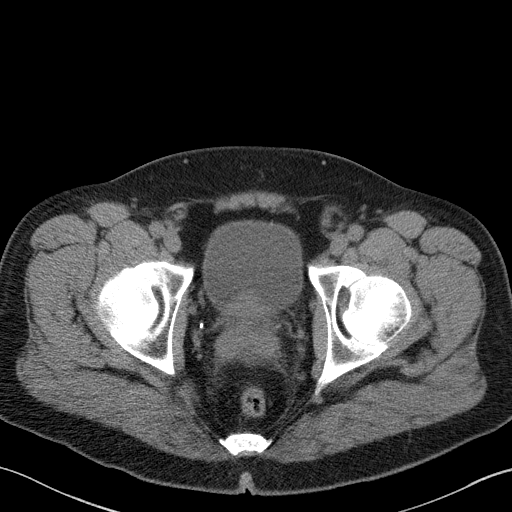
[im 29/55  soft-tissue]
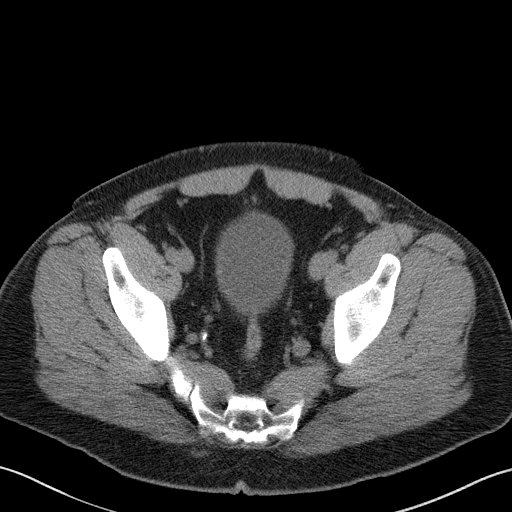
[im 33/55  soft-tissue]
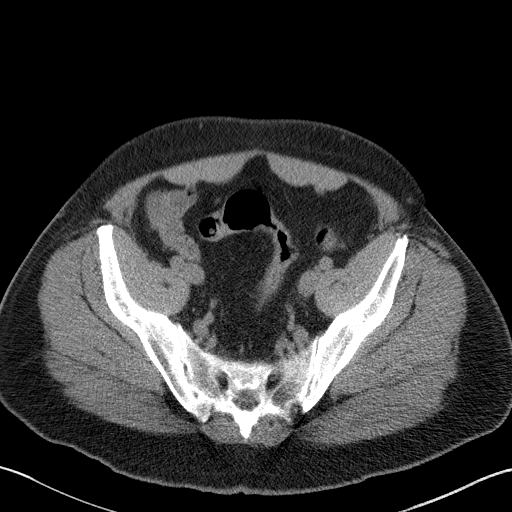
[im 37/55  soft-tissue]
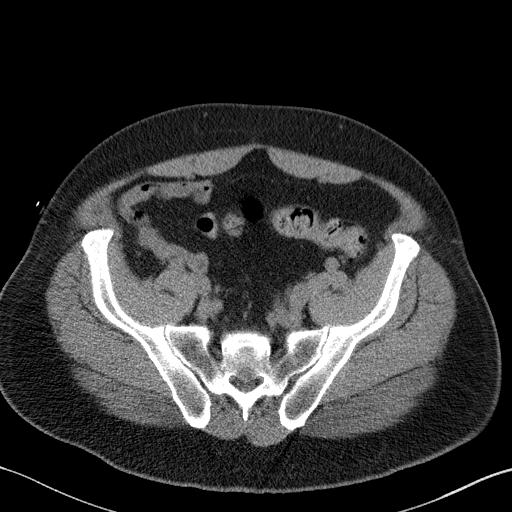
[im 37/55  bone]
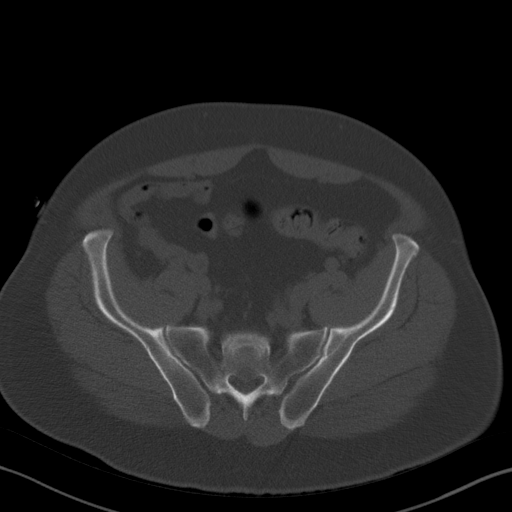
[im 40/55  soft-tissue]
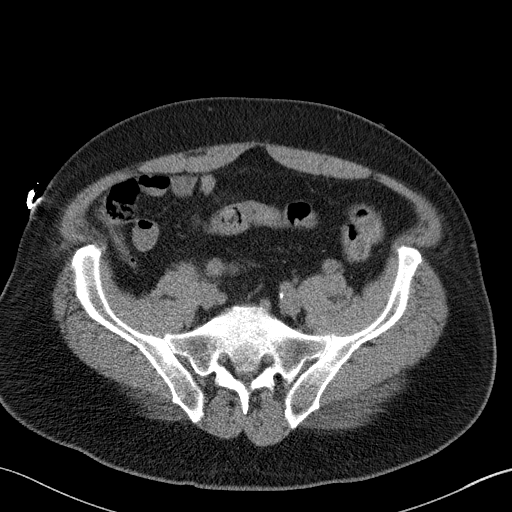
[im 44/55  soft-tissue]
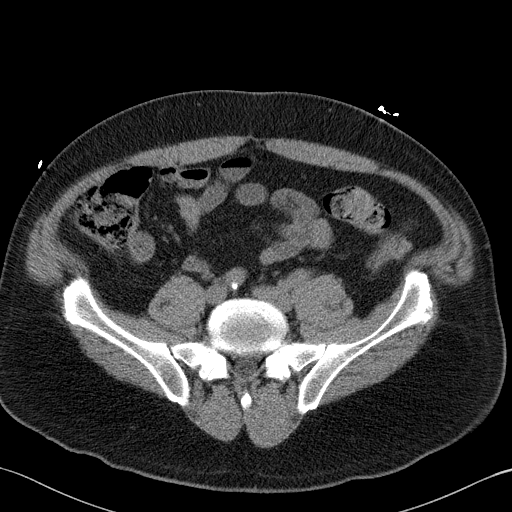
[im 47/55  soft-tissue]
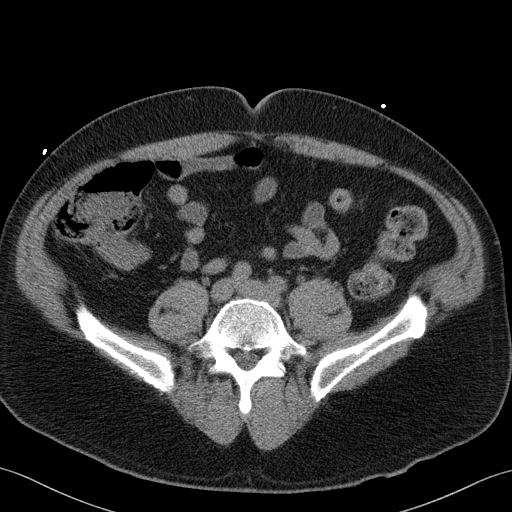
[im 51/55  soft-tissue]
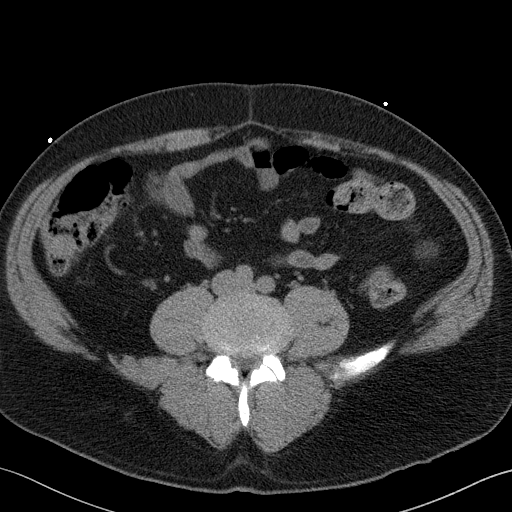

[Series 5: coronal images · coronal · 0.54mm/px · 3 of 110 slices shown]
[im 37/110  soft-tissue]
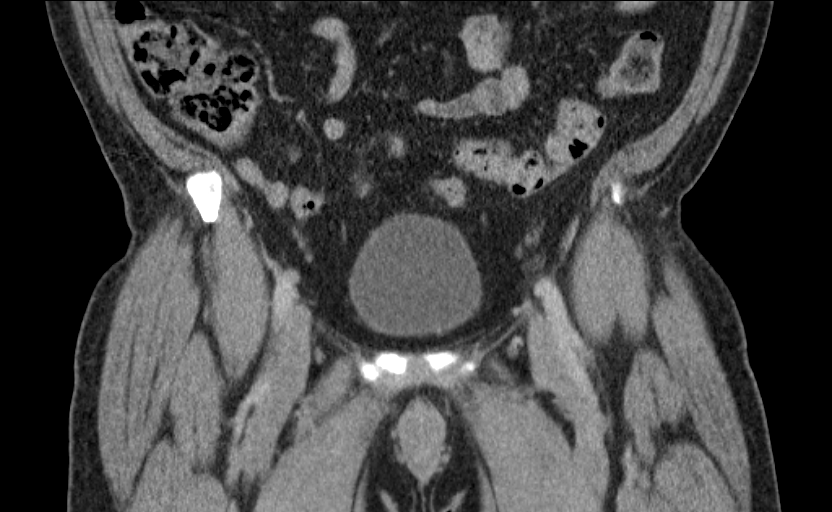
[im 49/110  soft-tissue]
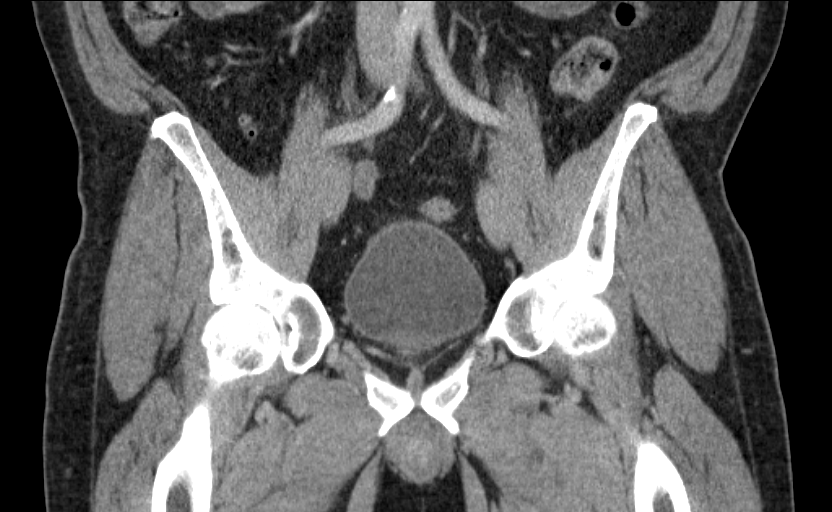
[im 61/110  soft-tissue]
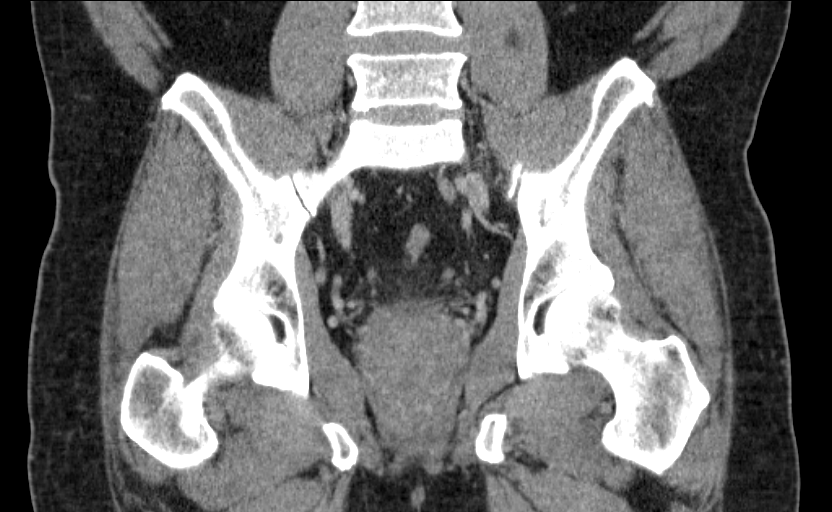

[16 of 46 positions shown; findings below may reference images not displayed]

FINDINGS: No significant osseous abnormality is noted. There is no evidence of
bowel obstruction. No abnormal fluid collection is noted. Urinary
bladder appears normal. No significant adenopathy is noted. Appendix
appears normal. Moderately enlarged prostate gland is noted
consistent with a history of prostatic carcinoma. No hematoma is
noted.
IMPRESSION: Moderate prostate enlargement is noted consistent with a history of
prostate carcinoma. No hematoma or abnormal fluid collection is
noted in the pelvis. No acute abnormality is noted.

## 2017-08-14 DIAGNOSIS — N390 Urinary tract infection, site not specified: Secondary | ICD-10-CM | POA: Diagnosis not present

## 2017-08-29 DIAGNOSIS — R300 Dysuria: Secondary | ICD-10-CM | POA: Diagnosis not present

## 2017-08-29 DIAGNOSIS — N411 Chronic prostatitis: Secondary | ICD-10-CM | POA: Diagnosis not present

## 2017-10-29 DIAGNOSIS — M9901 Segmental and somatic dysfunction of cervical region: Secondary | ICD-10-CM | POA: Diagnosis not present

## 2017-10-29 DIAGNOSIS — M9902 Segmental and somatic dysfunction of thoracic region: Secondary | ICD-10-CM | POA: Diagnosis not present

## 2017-10-29 DIAGNOSIS — M9903 Segmental and somatic dysfunction of lumbar region: Secondary | ICD-10-CM | POA: Diagnosis not present

## 2017-10-29 DIAGNOSIS — M47816 Spondylosis without myelopathy or radiculopathy, lumbar region: Secondary | ICD-10-CM | POA: Diagnosis not present

## 2017-10-30 DIAGNOSIS — M9903 Segmental and somatic dysfunction of lumbar region: Secondary | ICD-10-CM | POA: Diagnosis not present

## 2017-10-30 DIAGNOSIS — M47816 Spondylosis without myelopathy or radiculopathy, lumbar region: Secondary | ICD-10-CM | POA: Diagnosis not present

## 2017-10-30 DIAGNOSIS — M9902 Segmental and somatic dysfunction of thoracic region: Secondary | ICD-10-CM | POA: Diagnosis not present

## 2017-10-30 DIAGNOSIS — M9901 Segmental and somatic dysfunction of cervical region: Secondary | ICD-10-CM | POA: Diagnosis not present

## 2017-10-31 DIAGNOSIS — M9903 Segmental and somatic dysfunction of lumbar region: Secondary | ICD-10-CM | POA: Diagnosis not present

## 2017-10-31 DIAGNOSIS — M9901 Segmental and somatic dysfunction of cervical region: Secondary | ICD-10-CM | POA: Diagnosis not present

## 2017-10-31 DIAGNOSIS — M47816 Spondylosis without myelopathy or radiculopathy, lumbar region: Secondary | ICD-10-CM | POA: Diagnosis not present

## 2017-10-31 DIAGNOSIS — M9902 Segmental and somatic dysfunction of thoracic region: Secondary | ICD-10-CM | POA: Diagnosis not present

## 2017-11-01 DIAGNOSIS — M9901 Segmental and somatic dysfunction of cervical region: Secondary | ICD-10-CM | POA: Diagnosis not present

## 2017-11-01 DIAGNOSIS — M47816 Spondylosis without myelopathy or radiculopathy, lumbar region: Secondary | ICD-10-CM | POA: Diagnosis not present

## 2017-11-01 DIAGNOSIS — Z23 Encounter for immunization: Secondary | ICD-10-CM | POA: Diagnosis not present

## 2017-11-01 DIAGNOSIS — M9903 Segmental and somatic dysfunction of lumbar region: Secondary | ICD-10-CM | POA: Diagnosis not present

## 2017-11-01 DIAGNOSIS — M9902 Segmental and somatic dysfunction of thoracic region: Secondary | ICD-10-CM | POA: Diagnosis not present

## 2017-11-14 DIAGNOSIS — M9901 Segmental and somatic dysfunction of cervical region: Secondary | ICD-10-CM | POA: Diagnosis not present

## 2017-11-14 DIAGNOSIS — M9903 Segmental and somatic dysfunction of lumbar region: Secondary | ICD-10-CM | POA: Diagnosis not present

## 2017-11-14 DIAGNOSIS — M9902 Segmental and somatic dysfunction of thoracic region: Secondary | ICD-10-CM | POA: Diagnosis not present

## 2017-11-14 DIAGNOSIS — M47816 Spondylosis without myelopathy or radiculopathy, lumbar region: Secondary | ICD-10-CM | POA: Diagnosis not present

## 2017-11-15 DIAGNOSIS — M9903 Segmental and somatic dysfunction of lumbar region: Secondary | ICD-10-CM | POA: Diagnosis not present

## 2017-11-15 DIAGNOSIS — M47816 Spondylosis without myelopathy or radiculopathy, lumbar region: Secondary | ICD-10-CM | POA: Diagnosis not present

## 2017-11-15 DIAGNOSIS — M9902 Segmental and somatic dysfunction of thoracic region: Secondary | ICD-10-CM | POA: Diagnosis not present

## 2017-11-15 DIAGNOSIS — M9901 Segmental and somatic dysfunction of cervical region: Secondary | ICD-10-CM | POA: Diagnosis not present

## 2017-11-21 DIAGNOSIS — M9901 Segmental and somatic dysfunction of cervical region: Secondary | ICD-10-CM | POA: Diagnosis not present

## 2017-11-21 DIAGNOSIS — M47816 Spondylosis without myelopathy or radiculopathy, lumbar region: Secondary | ICD-10-CM | POA: Diagnosis not present

## 2017-11-21 DIAGNOSIS — M9902 Segmental and somatic dysfunction of thoracic region: Secondary | ICD-10-CM | POA: Diagnosis not present

## 2017-11-21 DIAGNOSIS — M9903 Segmental and somatic dysfunction of lumbar region: Secondary | ICD-10-CM | POA: Diagnosis not present

## 2017-11-27 DIAGNOSIS — M9903 Segmental and somatic dysfunction of lumbar region: Secondary | ICD-10-CM | POA: Diagnosis not present

## 2017-11-27 DIAGNOSIS — M47816 Spondylosis without myelopathy or radiculopathy, lumbar region: Secondary | ICD-10-CM | POA: Diagnosis not present

## 2017-11-27 DIAGNOSIS — M9902 Segmental and somatic dysfunction of thoracic region: Secondary | ICD-10-CM | POA: Diagnosis not present

## 2017-11-27 DIAGNOSIS — M9901 Segmental and somatic dysfunction of cervical region: Secondary | ICD-10-CM | POA: Diagnosis not present

## 2017-11-29 DIAGNOSIS — M9902 Segmental and somatic dysfunction of thoracic region: Secondary | ICD-10-CM | POA: Diagnosis not present

## 2017-11-29 DIAGNOSIS — M9903 Segmental and somatic dysfunction of lumbar region: Secondary | ICD-10-CM | POA: Diagnosis not present

## 2017-11-29 DIAGNOSIS — M47816 Spondylosis without myelopathy or radiculopathy, lumbar region: Secondary | ICD-10-CM | POA: Diagnosis not present

## 2017-11-29 DIAGNOSIS — M9901 Segmental and somatic dysfunction of cervical region: Secondary | ICD-10-CM | POA: Diagnosis not present

## 2017-12-05 DIAGNOSIS — M9903 Segmental and somatic dysfunction of lumbar region: Secondary | ICD-10-CM | POA: Diagnosis not present

## 2017-12-05 DIAGNOSIS — M9901 Segmental and somatic dysfunction of cervical region: Secondary | ICD-10-CM | POA: Diagnosis not present

## 2017-12-05 DIAGNOSIS — M47816 Spondylosis without myelopathy or radiculopathy, lumbar region: Secondary | ICD-10-CM | POA: Diagnosis not present

## 2017-12-05 DIAGNOSIS — M9902 Segmental and somatic dysfunction of thoracic region: Secondary | ICD-10-CM | POA: Diagnosis not present

## 2017-12-12 DIAGNOSIS — M9903 Segmental and somatic dysfunction of lumbar region: Secondary | ICD-10-CM | POA: Diagnosis not present

## 2017-12-12 DIAGNOSIS — M9901 Segmental and somatic dysfunction of cervical region: Secondary | ICD-10-CM | POA: Diagnosis not present

## 2017-12-12 DIAGNOSIS — M9902 Segmental and somatic dysfunction of thoracic region: Secondary | ICD-10-CM | POA: Diagnosis not present

## 2017-12-12 DIAGNOSIS — M47816 Spondylosis without myelopathy or radiculopathy, lumbar region: Secondary | ICD-10-CM | POA: Diagnosis not present

## 2017-12-17 DIAGNOSIS — F411 Generalized anxiety disorder: Secondary | ICD-10-CM | POA: Diagnosis not present

## 2017-12-17 DIAGNOSIS — F339 Major depressive disorder, recurrent, unspecified: Secondary | ICD-10-CM | POA: Diagnosis not present

## 2017-12-17 DIAGNOSIS — E559 Vitamin D deficiency, unspecified: Secondary | ICD-10-CM | POA: Diagnosis not present

## 2017-12-17 DIAGNOSIS — N390 Urinary tract infection, site not specified: Secondary | ICD-10-CM | POA: Diagnosis not present

## 2017-12-17 DIAGNOSIS — C61 Malignant neoplasm of prostate: Secondary | ICD-10-CM | POA: Diagnosis not present

## 2017-12-17 DIAGNOSIS — Z0000 Encounter for general adult medical examination without abnormal findings: Secondary | ICD-10-CM | POA: Diagnosis not present

## 2017-12-19 DIAGNOSIS — M9901 Segmental and somatic dysfunction of cervical region: Secondary | ICD-10-CM | POA: Diagnosis not present

## 2017-12-19 DIAGNOSIS — M9903 Segmental and somatic dysfunction of lumbar region: Secondary | ICD-10-CM | POA: Diagnosis not present

## 2017-12-19 DIAGNOSIS — M47816 Spondylosis without myelopathy or radiculopathy, lumbar region: Secondary | ICD-10-CM | POA: Diagnosis not present

## 2017-12-19 DIAGNOSIS — M9902 Segmental and somatic dysfunction of thoracic region: Secondary | ICD-10-CM | POA: Diagnosis not present

## 2018-04-30 DIAGNOSIS — N390 Urinary tract infection, site not specified: Secondary | ICD-10-CM | POA: Diagnosis not present

## 2018-04-30 DIAGNOSIS — L1230 Acquired epidermolysis bullosa, unspecified: Secondary | ICD-10-CM | POA: Diagnosis not present

## 2018-04-30 DIAGNOSIS — C61 Malignant neoplasm of prostate: Secondary | ICD-10-CM | POA: Diagnosis not present

## 2018-08-02 DIAGNOSIS — N419 Inflammatory disease of prostate, unspecified: Secondary | ICD-10-CM | POA: Diagnosis not present

## 2018-08-02 DIAGNOSIS — C61 Malignant neoplasm of prostate: Secondary | ICD-10-CM | POA: Diagnosis not present

## 2018-08-30 DIAGNOSIS — R3915 Urgency of urination: Secondary | ICD-10-CM | POA: Diagnosis not present

## 2018-08-30 DIAGNOSIS — R351 Nocturia: Secondary | ICD-10-CM | POA: Diagnosis not present

## 2018-08-30 DIAGNOSIS — Z23 Encounter for immunization: Secondary | ICD-10-CM | POA: Diagnosis not present

## 2018-08-30 DIAGNOSIS — R300 Dysuria: Secondary | ICD-10-CM | POA: Diagnosis not present

## 2018-08-30 DIAGNOSIS — R350 Frequency of micturition: Secondary | ICD-10-CM | POA: Diagnosis not present

## 2018-09-09 DIAGNOSIS — C61 Malignant neoplasm of prostate: Secondary | ICD-10-CM | POA: Diagnosis not present

## 2018-09-16 DIAGNOSIS — C61 Malignant neoplasm of prostate: Secondary | ICD-10-CM | POA: Diagnosis not present

## 2018-09-16 DIAGNOSIS — N429 Disorder of prostate, unspecified: Secondary | ICD-10-CM | POA: Diagnosis not present

## 2018-09-16 DIAGNOSIS — Z8546 Personal history of malignant neoplasm of prostate: Secondary | ICD-10-CM | POA: Diagnosis not present

## 2018-09-20 DIAGNOSIS — C61 Malignant neoplasm of prostate: Secondary | ICD-10-CM | POA: Diagnosis not present

## 2018-09-22 DIAGNOSIS — Z13220 Encounter for screening for lipoid disorders: Secondary | ICD-10-CM | POA: Diagnosis not present

## 2018-09-22 DIAGNOSIS — Z136 Encounter for screening for cardiovascular disorders: Secondary | ICD-10-CM | POA: Diagnosis not present

## 2018-09-22 DIAGNOSIS — Z131 Encounter for screening for diabetes mellitus: Secondary | ICD-10-CM | POA: Diagnosis not present

## 2018-09-22 DIAGNOSIS — Z713 Dietary counseling and surveillance: Secondary | ICD-10-CM | POA: Diagnosis not present

## 2018-09-22 DIAGNOSIS — Z6833 Body mass index (BMI) 33.0-33.9, adult: Secondary | ICD-10-CM | POA: Diagnosis not present

## 2018-11-25 DIAGNOSIS — C61 Malignant neoplasm of prostate: Secondary | ICD-10-CM | POA: Diagnosis not present

## 2018-12-06 DIAGNOSIS — G4733 Obstructive sleep apnea (adult) (pediatric): Secondary | ICD-10-CM | POA: Diagnosis not present

## 2018-12-06 DIAGNOSIS — C61 Malignant neoplasm of prostate: Secondary | ICD-10-CM | POA: Diagnosis not present

## 2018-12-06 DIAGNOSIS — Z01818 Encounter for other preprocedural examination: Secondary | ICD-10-CM | POA: Diagnosis not present

## 2018-12-10 DIAGNOSIS — F419 Anxiety disorder, unspecified: Secondary | ICD-10-CM | POA: Diagnosis not present

## 2018-12-10 DIAGNOSIS — N401 Enlarged prostate with lower urinary tract symptoms: Secondary | ICD-10-CM | POA: Diagnosis not present

## 2018-12-10 DIAGNOSIS — G4730 Sleep apnea, unspecified: Secondary | ICD-10-CM | POA: Diagnosis not present

## 2018-12-10 DIAGNOSIS — Z79899 Other long term (current) drug therapy: Secondary | ICD-10-CM | POA: Diagnosis not present

## 2018-12-10 DIAGNOSIS — Z7951 Long term (current) use of inhaled steroids: Secondary | ICD-10-CM | POA: Diagnosis not present

## 2018-12-10 DIAGNOSIS — Z7982 Long term (current) use of aspirin: Secondary | ICD-10-CM | POA: Diagnosis not present

## 2018-12-10 DIAGNOSIS — Z87440 Personal history of urinary (tract) infections: Secondary | ICD-10-CM | POA: Diagnosis not present

## 2018-12-10 DIAGNOSIS — Z791 Long term (current) use of non-steroidal anti-inflammatories (NSAID): Secondary | ICD-10-CM | POA: Diagnosis not present

## 2018-12-10 DIAGNOSIS — N400 Enlarged prostate without lower urinary tract symptoms: Secondary | ICD-10-CM | POA: Diagnosis not present

## 2018-12-10 DIAGNOSIS — G43909 Migraine, unspecified, not intractable, without status migrainosus: Secondary | ICD-10-CM | POA: Diagnosis not present

## 2018-12-10 DIAGNOSIS — Z880 Allergy status to penicillin: Secondary | ICD-10-CM | POA: Diagnosis not present

## 2018-12-10 DIAGNOSIS — N390 Urinary tract infection, site not specified: Secondary | ICD-10-CM | POA: Diagnosis not present

## 2018-12-10 DIAGNOSIS — Z87891 Personal history of nicotine dependence: Secondary | ICD-10-CM | POA: Diagnosis not present

## 2018-12-10 DIAGNOSIS — K219 Gastro-esophageal reflux disease without esophagitis: Secondary | ICD-10-CM | POA: Diagnosis not present

## 2018-12-10 DIAGNOSIS — N138 Other obstructive and reflux uropathy: Secondary | ICD-10-CM | POA: Diagnosis not present

## 2018-12-10 DIAGNOSIS — A419 Sepsis, unspecified organism: Secondary | ICD-10-CM | POA: Diagnosis not present

## 2018-12-10 DIAGNOSIS — C61 Malignant neoplasm of prostate: Secondary | ICD-10-CM | POA: Diagnosis not present

## 2018-12-10 DIAGNOSIS — F329 Major depressive disorder, single episode, unspecified: Secondary | ICD-10-CM | POA: Diagnosis not present

## 2019-02-11 DIAGNOSIS — J309 Allergic rhinitis, unspecified: Secondary | ICD-10-CM | POA: Diagnosis not present

## 2019-02-11 DIAGNOSIS — K210 Gastro-esophageal reflux disease with esophagitis: Secondary | ICD-10-CM | POA: Diagnosis not present

## 2019-02-11 DIAGNOSIS — F339 Major depressive disorder, recurrent, unspecified: Secondary | ICD-10-CM | POA: Diagnosis not present

## 2019-02-11 DIAGNOSIS — F439 Reaction to severe stress, unspecified: Secondary | ICD-10-CM | POA: Diagnosis not present

## 2019-03-24 DIAGNOSIS — C61 Malignant neoplasm of prostate: Secondary | ICD-10-CM | POA: Diagnosis not present

## 2019-12-21 ENCOUNTER — Emergency Department (HOSPITAL_COMMUNITY): Payer: 59

## 2019-12-21 ENCOUNTER — Encounter (HOSPITAL_COMMUNITY): Payer: Self-pay | Admitting: Emergency Medicine

## 2019-12-21 ENCOUNTER — Inpatient Hospital Stay (HOSPITAL_COMMUNITY)
Admission: EM | Admit: 2019-12-21 | Discharge: 2019-12-23 | DRG: 872 | Disposition: A | Discharge status: Home / Self Care | Payer: 59 | Attending: Internal Medicine | Admitting: Internal Medicine

## 2019-12-21 ENCOUNTER — Inpatient Hospital Stay (HOSPITAL_COMMUNITY): Payer: 59

## 2019-12-21 ENCOUNTER — Other Ambulatory Visit: Payer: Self-pay

## 2019-12-21 DIAGNOSIS — C61 Malignant neoplasm of prostate: Secondary | ICD-10-CM

## 2019-12-21 DIAGNOSIS — Z880 Allergy status to penicillin: Secondary | ICD-10-CM

## 2019-12-21 DIAGNOSIS — E669 Obesity, unspecified: Secondary | ICD-10-CM

## 2019-12-21 DIAGNOSIS — Z87440 Personal history of urinary (tract) infections: Secondary | ICD-10-CM | POA: Diagnosis not present

## 2019-12-21 DIAGNOSIS — K219 Gastro-esophageal reflux disease without esophagitis: Secondary | ICD-10-CM | POA: Diagnosis present

## 2019-12-21 DIAGNOSIS — Z79899 Other long term (current) drug therapy: Secondary | ICD-10-CM

## 2019-12-21 DIAGNOSIS — N400 Enlarged prostate without lower urinary tract symptoms: Secondary | ICD-10-CM | POA: Diagnosis present

## 2019-12-21 DIAGNOSIS — Z87891 Personal history of nicotine dependence: Secondary | ICD-10-CM

## 2019-12-21 DIAGNOSIS — Z6833 Body mass index (BMI) 33.0-33.9, adult: Secondary | ICD-10-CM

## 2019-12-21 DIAGNOSIS — Z20822 Contact with and (suspected) exposure to covid-19: Secondary | ICD-10-CM | POA: Diagnosis present

## 2019-12-21 DIAGNOSIS — N390 Urinary tract infection, site not specified: Secondary | ICD-10-CM

## 2019-12-21 DIAGNOSIS — A419 Sepsis, unspecified organism: Principal | ICD-10-CM

## 2019-12-21 DIAGNOSIS — Z8619 Personal history of other infectious and parasitic diseases: Secondary | ICD-10-CM | POA: Diagnosis not present

## 2019-12-21 LAB — URINALYSIS, ROUTINE W REFLEX MICROSCOPIC
Bacteria, UA: NONE SEEN
Bilirubin Urine: NEGATIVE
Glucose, UA: NEGATIVE mg/dL
Hgb urine dipstick: NEGATIVE
Ketones, ur: NEGATIVE mg/dL
Nitrite: NEGATIVE
Protein, ur: NEGATIVE mg/dL
Specific Gravity, Urine: 1.017 (ref 1.005–1.030)
WBC, UA: >50 WBC/hpf — ABNORMAL HIGH (ref 0–5)
pH: 9 — ABNORMAL HIGH (ref 5.0–8.0)

## 2019-12-21 LAB — CBC WITH DIFFERENTIAL/PLATELET
Abs Immature Granulocytes: 0.08 10*3/uL — ABNORMAL HIGH (ref 0.00–0.07)
Basophils Absolute: 0 10*3/uL (ref 0.0–0.1)
Basophils Relative: 0 %
Eosinophils Absolute: 0 10*3/uL (ref 0.0–0.5)
Eosinophils Relative: 0 %
HCT: 50.1 % (ref 39.0–52.0)
Hemoglobin: 16.4 g/dL (ref 13.0–17.0)
Immature Granulocytes: 1 %
Lymphocytes Relative: 3 %
Lymphs Abs: 0.5 10*3/uL — ABNORMAL LOW (ref 0.7–4.0)
MCH: 30.8 pg (ref 26.0–34.0)
MCHC: 32.7 g/dL (ref 30.0–36.0)
MCV: 94.2 fL (ref 80.0–100.0)
Monocytes Absolute: 0.6 10*3/uL (ref 0.1–1.0)
Monocytes Relative: 4 %
Neutro Abs: 13.3 10*3/uL — ABNORMAL HIGH (ref 1.7–7.7)
Neutrophils Relative %: 92 %
Platelets: 226 10*3/uL (ref 150–400)
RBC: 5.32 MIL/uL (ref 4.22–5.81)
RDW: 13.1 % (ref 11.5–15.5)
WBC: 14.5 10*3/uL — ABNORMAL HIGH (ref 4.0–10.5)
nRBC: 0 % (ref 0.0–0.2)

## 2019-12-21 LAB — COMPREHENSIVE METABOLIC PANEL
ALT: 31 U/L (ref 0–44)
AST: 30 U/L (ref 15–41)
Albumin: 4.4 g/dL (ref 3.5–5.0)
Alkaline Phosphatase: 76 U/L (ref 38–126)
Anion gap: 12 (ref 5–15)
BUN: 21 mg/dL (ref 8–23)
CO2: 23 mmol/L (ref 22–32)
Calcium: 9.5 mg/dL (ref 8.9–10.3)
Chloride: 104 mmol/L (ref 98–111)
Creatinine, Ser: 1.2 mg/dL (ref 0.61–1.24)
GFR calc Af Amer: >60 mL/min (ref >60)
GFR calc non Af Amer: >60 mL/min (ref >60)
Glucose, Bld: 119 mg/dL — ABNORMAL HIGH (ref 70–99)
Potassium: 4 mmol/L (ref 3.5–5.1)
Sodium: 139 mmol/L (ref 135–145)
Total Bilirubin: 0.9 mg/dL (ref 0.3–1.2)
Total Protein: 6.9 g/dL (ref 6.5–8.1)

## 2019-12-21 LAB — LACTIC ACID, PLASMA
Lactic Acid, Venous: 2.4 mmol/L (ref 0.5–1.9)
Lactic Acid, Venous: 2.4 mmol/L (ref 0.5–1.9)
Lactic Acid, Venous: 1.7 mmol/L (ref 0.5–1.9)
Lactic Acid, Venous: 3.2 mmol/L (ref 0.5–1.9)

## 2019-12-21 LAB — HIV ANTIBODY (ROUTINE TESTING W REFLEX): HIV Screen 4th Generation wRfx: NONREACTIVE

## 2019-12-21 LAB — APTT: aPTT: 29 seconds (ref 24–36)

## 2019-12-21 LAB — RESPIRATORY PANEL BY RT PCR (FLU A&B, COVID)
Influenza A by PCR: NEGATIVE
Influenza B by PCR: NEGATIVE
SARS Coronavirus 2 by RT PCR: NEGATIVE

## 2019-12-21 LAB — PROTIME-INR
INR: 1 (ref 0.8–1.2)
Prothrombin Time: 12.9 seconds (ref 11.4–15.2)

## 2019-12-21 MED ORDER — LACTATED RINGERS IV BOLUS
1000.0000 mL | Freq: Once | INTRAVENOUS | Status: AC
  Administered 2019-12-21: 1000 mL via INTRAVENOUS

## 2019-12-21 MED ORDER — SODIUM CHLORIDE 0.9 % IV SOLN: INTRAVENOUS | Status: DC

## 2019-12-21 MED ORDER — ACETAMINOPHEN 650 MG RE SUPP: 650.0000 mg | Freq: Four times a day (QID) | RECTAL | Status: DC | PRN

## 2019-12-21 MED ORDER — SODIUM CHLORIDE 0.9 % IV SOLN
1.0000 g | INTRAVENOUS | Status: DC
  Administered 2019-12-21: 1 g via INTRAVENOUS
  Filled 2019-12-21: qty 10

## 2019-12-21 MED ORDER — HYDROMORPHONE HCL 1 MG/ML IJ SOLN
1.0000 mg | Freq: Once | INTRAMUSCULAR | Status: AC
  Administered 2019-12-21: 1 mg via INTRAVENOUS
  Filled 2019-12-21: qty 1

## 2019-12-21 MED ORDER — ONDANSETRON HCL 4 MG/2ML IJ SOLN: 4.0000 mg | Freq: Four times a day (QID) | INTRAMUSCULAR | Status: DC | PRN

## 2019-12-21 MED ORDER — ACETAMINOPHEN 500 MG PO TABS
1000.0000 mg | ORAL_TABLET | Freq: Once | ORAL | Status: AC
  Administered 2019-12-21: 1000 mg via ORAL
  Filled 2019-12-21: qty 2

## 2019-12-21 MED ORDER — ENOXAPARIN SODIUM 40 MG/0.4ML ~~LOC~~ SOLN
40.0000 mg | SUBCUTANEOUS | Status: DC
  Administered 2019-12-21 – 2019-12-22 (×2): 40 mg via SUBCUTANEOUS
  Filled 2019-12-21 (×3): qty 0.4

## 2019-12-21 MED ORDER — ONDANSETRON HCL 4 MG PO TABS: 4.0000 mg | ORAL_TABLET | Freq: Four times a day (QID) | ORAL | Status: DC | PRN

## 2019-12-21 MED ORDER — HYDROCODONE-ACETAMINOPHEN 5-325 MG PO TABS
1.0000 | ORAL_TABLET | Freq: Four times a day (QID) | ORAL | Status: DC | PRN
  Administered 2019-12-21 – 2019-12-22 (×3): 1 via ORAL
  Filled 2019-12-21 (×3): qty 1

## 2019-12-21 MED ORDER — TAMSULOSIN HCL 0.4 MG PO CAPS
0.4000 mg | ORAL_CAPSULE | Freq: Every day | ORAL | Status: DC
  Administered 2019-12-21 – 2019-12-23 (×3): 0.4 mg via ORAL
  Filled 2019-12-21 (×4): qty 1

## 2019-12-21 MED ORDER — ALBUTEROL SULFATE (2.5 MG/3ML) 0.083% IN NEBU: 2.5000 mg | INHALATION_SOLUTION | Freq: Four times a day (QID) | RESPIRATORY_TRACT | Status: DC | PRN

## 2019-12-21 MED ORDER — SODIUM CHLORIDE 0.9% FLUSH
3.0000 mL | Freq: Two times a day (BID) | INTRAVENOUS | Status: DC
  Administered 2019-12-21 – 2019-12-23 (×2): 3 mL via INTRAVENOUS

## 2019-12-21 MED ORDER — ACETAMINOPHEN 325 MG PO TABS
650.0000 mg | ORAL_TABLET | Freq: Four times a day (QID) | ORAL | Status: DC | PRN
  Administered 2019-12-21: 650 mg via ORAL
  Filled 2019-12-21: qty 2

## 2019-12-21 MED ORDER — FENTANYL CITRATE (PF) 100 MCG/2ML IJ SOLN
50.0000 ug | Freq: Once | INTRAMUSCULAR | Status: DC
  Filled 2019-12-21: qty 2

## 2019-12-21 MED ORDER — PANTOPRAZOLE SODIUM 40 MG PO TBEC
40.0000 mg | DELAYED_RELEASE_TABLET | Freq: Every day | ORAL | Status: DC
  Administered 2019-12-21 – 2019-12-23 (×3): 40 mg via ORAL
  Filled 2019-12-21 (×3): qty 1

## 2019-12-21 MED ORDER — SODIUM CHLORIDE 0.9 % IV BOLUS
500.0000 mL | Freq: Once | INTRAVENOUS | Status: AC
  Administered 2019-12-21: 500 mL via INTRAVENOUS

## 2019-12-21 MED ORDER — SODIUM CHLORIDE 0.9 % IV BOLUS
1000.0000 mL | Freq: Once | INTRAVENOUS | Status: AC
  Administered 2019-12-21: 1000 mL via INTRAVENOUS

## 2019-12-21 MED ORDER — IOHEXOL 300 MG/ML  SOLN
100.0000 mL | Freq: Once | INTRAMUSCULAR | Status: AC | PRN
  Administered 2019-12-21: 100 mL via INTRAVENOUS

## 2019-12-21 MED ORDER — SODIUM CHLORIDE 0.9 % IV SOLN
1.0000 g | Freq: Three times a day (TID) | INTRAVENOUS | Status: DC
  Administered 2019-12-21 – 2019-12-23 (×7): 1 g via INTRAVENOUS
  Filled 2019-12-21 (×10): qty 1

## 2019-12-21 MED ORDER — SODIUM CHLORIDE 0.9 % IV SOLN: Freq: Once | INTRAVENOUS | Status: AC

## 2019-12-21 NOTE — ED Triage Notes (Addendum)
Pt reports hx of urosepsis a few years ago, states that last night he began having urinary frequency, lower back pain and burning, unsure If he had fevers but had chills and shaking. Hx of prostate cancer, has not seen urology since early 2019, not doing any treatment currently. Pt a/ox4.

## 2019-12-21 NOTE — ED Notes (Signed)
Pt transported to CT ?

## 2019-12-21 NOTE — ED Notes (Signed)
Wife updated by this RN.

## 2019-12-21 NOTE — Progress Notes (Addendum)
Notified provider and bedside RN of need to order and draw repeat lactic acid.

## 2019-12-21 NOTE — Progress Notes (Signed)
Patient arrived to [id number]. AOx4, VSS, ambulatory, nom complaints of pain at this time. Oriented to room, use of call bell, bed controls, informed about visitation policy. Will continue to monitor.

## 2019-12-21 NOTE — ED Notes (Signed)
ED Provider at bedside. 

## 2019-12-21 NOTE — Progress Notes (Signed)
CRITICAL VALUE ALERT  Critical Value: 3.2 Lactic Acid  Date & Time Notied: 12/21/19 at 1425  Provider Notified: Dr Tamala Julian, Eustaquio Boyden  Orders Received/Actions taken: awaiting

## 2019-12-21 NOTE — H&P (Addendum)
History and Physical    TWAIN POLLAN [id number] DOB: 1956-08-28 DOA: 12/21/2019  Referring MD/NP/PA: Quintella Reichert, MD PCP: Lawerance Cruel, MD  Patient coming from: Home via personal vehicle  Chief Complaint: Fever and shakes  I have personally briefly reviewed patient's old medical records in [place]   HPI: Craig Butler is a 64 y.o. male with medical history significant of recurrent prostate cancer, BPH, prostatitis, recurrent UTIs, and ESBL E. coli sepsis.  Patient presents after acutely waking up around 2-3 AM with subjective fever and rigors.  Noted associated symptoms of dysuria, abdominal cramping, and some lower right back pain.  He did not try anything to relieve symptoms.  Denies any nausea, vomiting, diarrhea chest pain, shortness of breath, or cough.  Symptoms felt similar to previous episodes of sepsis related to E. coli.  First developed sepsis after a prostate biopsy back in 2016 and he reports spending 3 to 4 years treating the infections, but he reports that he had been doing well over the last 1.5 years without any issues.  He had previously been following at [facility] for the issues with his prostate and infections.  Patient reports being told that his prostate cancer is slow-growing and ultimately his prostate needs to be taken out.  He has not followed up to pursue this any further since the pandemic started.  ED Course: On admission to the emergency department patient was seen to be febrile up to 100.5 F, heart rates 114 122, respirations 19-30,-and all other vital signs maintained.  Labs significant for WBC 14.5 and lactic acid 2.4.  Urinalysis was positive for small leukocytes, negative for nitrites, no bacteria seen, greater than 50 WBCs, and 6-10 RBCs.  Negative for influenza and COVID-19.  Patient had initially been ordered Rocephin.  Review of Systems  Constitutional: Positive for chills, fever and malaise/fatigue.  HENT: Negative for congestion and  sinus pain.   Eyes: Negative for discharge and redness.  Respiratory: Negative for cough and shortness of breath.   Cardiovascular: Negative for chest pain and leg swelling.  Gastrointestinal: Positive for abdominal pain. Negative for blood in stool, diarrhea, nausea and vomiting.  Genitourinary: Positive for dysuria and frequency.  Musculoskeletal: Positive for back pain. Negative for falls.  Skin: Negative for rash.  Neurological: Negative for focal weakness and loss of consciousness.  Endo/Heme/Allergies: Negative for polydipsia. Does not bruise/bleed easily.  Psychiatric/Behavioral: Negative for memory loss and substance abuse.    Past Medical History:  Diagnosis Date  . Acid reflux   . Anxiety   . Depression   . Migraine     Past Surgical History:  Procedure Laterality Date  . BIOPSY PROSTATE       reports that he has quit smoking. He does not have any smokeless tobacco history on file. He reports current alcohol use. No history on file for drug.  Allergies  Allergen Reactions  . Amoxicillin Rash    Family History  Problem Relation Age of Onset  . Cancer Father   . Cancer Brother     Prior to Admission medications   Medication Sig Start Date End Date Taking? Authorizing Provider  omeprazole (PRILOSEC) 20 MG capsule Take 20 mg by mouth daily. 04/06/16  Yes [provider]  fosfomycin (MONUROL) 3 G PACK Take 3 g by mouth once. Patient not taking: Reported on 12/21/2019 11/02/15   Carlyle Basques, MD  tamsulosin (FLOMAX) 0.4 MG CAPS capsule Take 1 capsule (0.4 mg total) by mouth daily. Patient  not taking: Reported on 12/21/2019 07/30/15   Nita Sells, MD    Physical Exam:  Constitutional: Older obese male who is able to fall commands Vitals:   12/21/19 0630 12/21/19 0645 12/21/19 0700 12/21/19 0730  BP: 122/63 (!) 121/99 106/69 103/77  Pulse: (!) 114 (!) 117 (!) 115 (!) 118  Resp: (!) 30 (!) 23 19 (!) 21  Temp:      SpO2: 96% 97% 94% 95%    Weight:      Height:       Eyes: PERRL, lids and conjunctivae normal ENMT: Mucous membranes are moist. Posterior pharynx clear of any exudate or lesions.  Neck: normal, supple, no masses, no thyromegaly Respiratory: clear to auscultation bilaterally, no wheezing, no crackles. Normal respiratory effort. No accessory muscle use.  Cardiovascular: Tachycardic, no murmurs / rubs / gallops.  Trace bilateral lower extremity edema. 2+ pedal pulses. No carotid bruits.  Abdomen: no tenderness, no masses palpated. No hepatosplenomegaly. Bowel sounds positive.  Musculoskeletal: no clubbing / cyanosis. No joint deformity upper and lower extremities. Good ROM, no contractures. Normal muscle tone.  Skin: no rashes, lesions, ulcers. No induration Neurologic: CN 2-12 grossly intact. Sensation intact, DTR normal. Strength 5/5 in all 4.  Psychiatric: Normal judgment and insight. Alert and oriented x 3. Normal mood.     Labs on Admission: I have personally reviewed following labs and imaging studies  CBC: Recent Labs  Lab 12/21/19 0554  WBC 14.5*  NEUTROABS 13.3*  HGB 16.4  HCT 50.1  MCV 94.2  PLT [id number]   Basic Metabolic Panel: Recent Labs  Lab 12/21/19 0554  NA 139  K 4.0  CL 104  CO2 23  GLUCOSE 119*  BUN 21  CREATININE 1.20  CALCIUM 9.5   GFR: Estimated Creatinine Clearance: 77.2 mL/min (by C-G formula based on SCr of 1.2 mg/dL). Liver Function Tests: Recent Labs  Lab 12/21/19 0554  AST 30  ALT 31  ALKPHOS 76  BILITOT 0.9  PROT 6.9  ALBUMIN 4.4   No results for input(s): LIPASE, AMYLASE in the last 168 hours. No results for input(s): AMMONIA in the last 168 hours. Coagulation Profile: Recent Labs  Lab 12/21/19 0554  INR 1.0   Cardiac Enzymes: No results for input(s): CKTOTAL, CKMB, CKMBINDEX, TROPONINI in the last 168 hours. BNP (last 3 results) No results for input(s): PROBNP in the last 8760 hours. HbA1C: No results for input(s): HGBA1C in the last 72  hours. CBG: No results for input(s): GLUCAP in the last 168 hours. Lipid Profile: No results for input(s): CHOL, HDL, LDLCALC, TRIG, CHOLHDL, LDLDIRECT in the last 72 hours. Thyroid Function Tests: No results for input(s): TSH, T4TOTAL, FREET4, T3FREE, THYROIDAB in the last 72 hours. Anemia Panel: No results for input(s): VITAMINB12, FOLATE, FERRITIN, TIBC, IRON, RETICCTPCT in the last 72 hours. Urine analysis:    Component Value Date/Time   COLORURINE YELLOW 12/21/2019 0602   APPEARANCEUR CLEAR 12/21/2019 0602   LABSPEC 1.017 12/21/2019 0602   PHURINE 9.0 (H) 12/21/2019 0602   GLUCOSEU NEGATIVE 12/21/2019 0602   HGBUR NEGATIVE 12/21/2019 0602   BILIRUBINUR NEGATIVE 12/21/2019 0602   KETONESUR NEGATIVE 12/21/2019 0602   PROTEINUR NEGATIVE 12/21/2019 0602   UROBILINOGEN 0.2 08/13/2015 0619   NITRITE NEGATIVE 12/21/2019 0602   LEUKOCYTESUR SMALL (A) 12/21/2019 0602   Sepsis Labs: No results found for this or any previous visit (from the past 240 hour(s)).   Radiological Exams on Admission: CT ABDOMEN PELVIS W CONTRAST  Result Date: 12/21/2019 CLINICAL DATA:  64 year old male with abdominal and flank pain with dysuria. EXAM: CT ABDOMEN AND PELVIS WITH CONTRAST TECHNIQUE: Multidetector CT imaging of the abdomen and pelvis was performed using the standard protocol following bolus administration of intravenous contrast. CONTRAST:  166mL OMNIPAQUE IOHEXOL 300 MG/ML  SOLN COMPARISON:  08/16/2016 CT and prior studies FINDINGS: Lower chest: No acute abnormality. Hepatobiliary: The liver and gallbladder are unremarkable. No biliary dilatation. Pancreas: Unremarkable Spleen: Unremarkable Adrenals/Urinary Tract: The kidneys, adrenal glands and bladder are unremarkable. Stomach/Bowel: Stomach is within normal limits. Appendix appears normal. No evidence of bowel wall thickening, distention, or inflammatory changes. Vascular/Lymphatic: Aortic atherosclerosis. No enlarged abdominal or pelvic lymph  nodes. Reproductive: Prostate enlargement is identified. Other: No ascites, abscess or pneumoperitoneum. Musculoskeletal: No acute or suspicious bony abnormalities identified. IMPRESSION: 1. No evidence of acute abnormality. 2. Prostate enlargement. 3. Aortic Atherosclerosis (ICD10-I70.0). Electronically Signed   By: Margarette Canada M.D.   On: 12/21/2019 07:28    EKG: Independently reviewed.  Sinus tachycardia 122 bpm  Assessment/Plan Sepsis secondary to urinary tract infect vs. prostatitis: Acute.  Patient presents for febrile up to 100.5 F, tachycardic, and tachypneic.  WBC elevated 14.5 with elevated lactic acid.  Urinalysis abnormal with small leukocytes, greater than 50 WBCs, 6-10 RBCs.  CT scan of the abdomen and pelvis did not note any acute abnormality and prostate enlargement.  Review of previous records including most recently [facility] 09/2018 on care everywhere it appears patient has had multidrug-resistant E. coli susceptible to Zosyn, ertapenem, gentamicin, imipenem, meropenem, Zosyn, and tobramycin.  Patient has a penicillin allergy.  He had been ordered Rocephin and was initially given 2 L of normal saline IV fluid.  -Admit to a medical telemetry bed -Follow-up urine and blood cultures -Discontinue Rocephin -Start meropenem -Give 1 additional liter of normal saline IV fluids at 100 mL/h -Tylenol as needed for fever -Trend lactic acid levels  Prostate cancer, history of prostatitis, BPH: Patient reports having several biopsies done of his prostate.  He reports infection with ESBL started after his first biopsy in 2016.  Last done in 11/2018 at [facility] which showed multiple areas prostatic adenocarcinoma grade group 2 and one of grade 1.  He reports being told that prostate cancer was slow-growing.  He is not currently on Flomax. -Start Flomax -Needs to follow-up with urology  History of ESBL -Changed antibiotics as seen above  GERD: Home medications include Protonix 40 mg daily -Continue  pharmacy substitution of Protonix.  Obesity: BMI 33 kg/m -Encourage patient on the need of weight loss.  DVT prophylaxis: Lovenox Code Status: Full Family Communication: No family requested to be updated at this time Disposition Plan: Possible discharge home in 2 to 3 days Consults called: None Admission status: Inpatient  Norval Morton MD [facility] Pager [phone]   If 7PM-7AM, please contact night-coverage [email or web address] Password [facility]  12/21/2019, 8:15 AM

## 2019-12-21 NOTE — ED Provider Notes (Signed)
Emergency Department Provider Note   I have reviewed the triage vital signs and the nursing notes.   HISTORY  Chief Complaint Urinary Tract Infection   HPI Craig Butler is a 64 y.o. male with medical problems document below presents the emerge department today for chills and dysuria.  Patient has a history of prostate cancer and prostatitis, recurrent, pending a with biopsy soon.  Patient states that last night he started having dysuria similar to previous UTIs but then tonight he woke up with rigors.  Did not check his own temperature but this was similar to previous episodes of sepsis he presents here for further evaluation.  He does state that he has some back pain at this on the right side close to midline Lower thoracic upper lumbar area.  No vomiting or nausea this time.   No other associated or modifying symptoms.    Past Medical History:  Diagnosis Date  . Acid reflux   . Anxiety   . Depression   . Migraine     Patient Active Problem List   Diagnosis Date Noted  . Recurrent UTI 11/09/2015  . History of ESBL E. coli infection 11/09/2015  . Acute urinary retention 07/26/2015  . Hematuria 07/26/2015  . Constipation 07/26/2015  . Sepsis ([place]) 07/24/2015  . UTI (lower urinary tract infection) 07/24/2015  . AKI (acute kidney injury) ([place]) 07/24/2015  . Acid reflux   . Migraine   . Depression   . Anxiety   . H/O prostate biopsy     Past Surgical History:  Procedure Laterality Date  . BIOPSY PROSTATE      Current Outpatient Rx  . Order #: [id number] Class: Historical Med  . Order #: [id number] Class: Normal  . Order #: [id number] Class: Normal    Allergies Amoxicillin  Family History  Problem Relation Age of Onset  . Cancer Father   . Cancer Brother     Social History Social History   Tobacco Use  . Smoking status: Former Smoker  Substance Use Topics  . Alcohol use: Yes  . Drug use: Not on file    Review of Systems  All other systems  negative except as documented in the HPI. All pertinent positives and negatives as reviewed in the HPI. ____________________________________________   PHYSICAL EXAM:  VITAL SIGNS: ED Triage Vitals  Enc Vitals Group     BP 12/21/19 0530 126/79     Pulse Rate 12/21/19 0530 (!) 122     Resp 12/21/19 0530 20     Temp 12/21/19 0530 (!) 100.5 F (38.1 C)     Temp src --      SpO2 12/21/19 0530 96 %     Weight 12/21/19 0549 230 lb (104.3 kg)     Height 12/21/19 0549 5\' 10"  (1.778 m)    Constitutional: Alert and oriented. Well appearing and in no acute distress. Eyes: Conjunctivae are normal. PERRL. EOMI. Head: Atraumatic. Nose: No congestion/rhinnorhea. Mouth/Throat: Mucous membranes are moist.  Oropharynx non-erythematous. Neck: No stridor.  No meningeal signs.   Cardiovascular: tachycardic rate, regular rhythm. Good peripheral circulation. Grossly normal heart sounds.   Respiratory: tachypneic respiratory effort.  No retractions. Lungs CTAB. Gastrointestinal: Soft and nontender. No distention.  Musculoskeletal: No lower extremity tenderness nor edema. No gross deformities of extremities. Neurologic:  Normal speech and language. No gross focal neurologic deficits are appreciated.  Skin:  Skin is warm, dry and intact. No rash noted.  ____________________________________________   LABS (all labs ordered are listed, but only  abnormal results are displayed)  Labs Reviewed  LACTIC ACID, PLASMA - Abnormal; Notable for the following components:      Result Value   Lactic Acid, Venous 2.4 (*)    All other components within normal limits  COMPREHENSIVE METABOLIC PANEL - Abnormal; Notable for the following components:   Glucose, Bld 119 (*)    All other components within normal limits  CBC WITH DIFFERENTIAL/PLATELET - Abnormal; Notable for the following components:   WBC 14.5 (*)    Neutro Abs 13.3 (*)    Lymphs Abs 0.5 (*)    Abs Immature Granulocytes 0.08 (*)    All other  components within normal limits  URINALYSIS, ROUTINE W REFLEX MICROSCOPIC - Abnormal; Notable for the following components:   pH 9.0 (*)    Leukocytes,Ua SMALL (*)    WBC, UA >50 (*)    All other components within normal limits  CULTURE, BLOOD (ROUTINE X 2)  CULTURE, BLOOD (ROUTINE X 2)  URINE CULTURE  RESPIRATORY PANEL BY RT PCR (FLU A&B, COVID)  APTT  PROTIME-INR  LACTIC ACID, PLASMA   ____________________________________________  EKG   EKG Interpretation  Date/Time:  Sunday December 21 2019 05:42:48 EST Ventricular Rate:  122 PR Interval:    QRS Duration: 93 QT Interval:  287 QTC Calculation: 409 R Axis:   13 Text Interpretation: Sinus tachycardia Borderline ST elevation, anterior leads faster rate than previous Confirmed by Merrily Pew [phone]) on 12/21/2019 6:35:55 AM       ____________________________________________  RADIOLOGY  No results found.  ____________________________________________   PROCEDURES  Procedure(s) performed:   .Critical Care Performed by: Merrily Pew, MD Authorized by: Merrily Pew, MD   Critical care provider statement:    Critical care time (minutes):  45   Critical care was necessary to treat or prevent imminent or life-threatening deterioration of the following conditions:  Sepsis   Critical care was time spent personally by me on the following activities:  Discussions with consultants, evaluation of patient's response to treatment, examination of patient, ordering and performing treatments and interventions, ordering and review of laboratory studies, ordering and review of radiographic studies, pulse oximetry, re-evaluation of patient's condition, obtaining history from patient or surrogate and review of old charts     ____________________________________________   INITIAL IMPRESSION / [place] / ED COURSE  Patient here with sepsis likely related to prostatitis.  He does have some back pain as needed could be  related to his prostate.  Could also be related pyelonephritis will get a CT scan.  Patient will likely need to be admitted unless he has a drastic turnaround.  Care transferred pending CT scan and reevaluation with final disposition.  Pertinent labs & imaging results that were available during my care of the patient were reviewed by me and considered in my medical decision making (see chart for details). ____________________________________________  FINAL CLINICAL IMPRESSION(S) / ED DIAGNOSES  Final diagnoses:  None     MEDICATIONS GIVEN DURING THIS VISIT:  Medications  cefTRIAXone (ROCEPHIN) 1 g in sodium chloride 0.9 % 100 mL IVPB (1 g Intravenous New Bag/Given 12/21/19 0612)  sodium chloride 0.9 % bolus 1,000 mL (1,000 mLs Intravenous New Bag/Given 12/21/19 [id number])  acetaminophen (TYLENOL) tablet 1,000 mg (1,000 mg Oral Given 12/21/19 0604)     NEW OUTPATIENT MEDICATIONS STARTED DURING THIS VISIT:  New Prescriptions   No medications on file    Note:  This note was prepared with assistance of Dragon voice recognition software. Occasional wrong-word or  sound-a-like substitutions may have occurred due to the inherent limitations of voice recognition software.   Assad Harbeson, Corene Cornea, MD 12/22/19 0005

## 2019-12-21 NOTE — ED Provider Notes (Signed)
Patient care assumed at 0700.  Pt with hx/o prostate cancer and recurrent infections here with 24 hrs of dysuria, low back pain.  He has been treated with IVF and antibiotics.  CT pending.    CT scan without acute abnormality. Patient with persistent tachycardia, mild leukocytosis and elevated lactate. Concern for sepsis. Discussed with patient findings of studies and recommendation for admission and patient is in agreement with treatment plan. Hospitalist consulted for admission.   Quintella Reichert, MD 12/21/19 [phone]

## 2019-12-22 LAB — BASIC METABOLIC PANEL
Anion gap: 11 (ref 5–15)
BUN: 17 mg/dL (ref 8–23)
CO2: 23 mmol/L (ref 22–32)
Calcium: 8.4 mg/dL — ABNORMAL LOW (ref 8.9–10.3)
Chloride: 103 mmol/L (ref 98–111)
Creatinine, Ser: 1.15 mg/dL (ref 0.61–1.24)
GFR calc Af Amer: >60 mL/min (ref >60)
GFR calc non Af Amer: >60 mL/min (ref >60)
Glucose, Bld: 109 mg/dL — ABNORMAL HIGH (ref 70–99)
Potassium: 4 mmol/L (ref 3.5–5.1)
Sodium: 137 mmol/L (ref 135–145)

## 2019-12-22 LAB — CBC
HCT: 39.2 % (ref 39.0–52.0)
Hemoglobin: 12.8 g/dL — ABNORMAL LOW (ref 13.0–17.0)
MCH: 30.8 pg (ref 26.0–34.0)
MCHC: 32.7 g/dL (ref 30.0–36.0)
MCV: 94.5 fL (ref 80.0–100.0)
Platelets: 172 10*3/uL (ref 150–400)
RBC: 4.15 MIL/uL — ABNORMAL LOW (ref 4.22–5.81)
RDW: 13.5 % (ref 11.5–15.5)
WBC: 20 10*3/uL — ABNORMAL HIGH (ref 4.0–10.5)
nRBC: 0 % (ref 0.0–0.2)

## 2019-12-22 NOTE — Progress Notes (Signed)
Per Infection Prevention, ESBL is old and contact precautions can be DC'd.

## 2019-12-22 NOTE — Plan of Care (Signed)

## 2019-12-22 NOTE — Progress Notes (Signed)
PROGRESS NOTE    Craig Butler  SHF:[id number] DOB: 1956-06-06 DOA: 12/21/2019 PCP: Lawerance Cruel, MD  Brief Narrative:  HPI: Craig Butler is a 64 y.o. male with medical history significant of recurrent prostate cancer, BPH, prostatitis, recurrent UTIs, and ESBL E. coli sepsis.  Patient presents after acutely waking up around 2-3 AM with subjective fever and rigors.  Noted associated symptoms of dysuria, abdominal cramping, and some lower right back pain.  Denies any nausea, vomiting, diarrhea chest pain, shortness of breath, or cough.  Symptoms felt similar to previous episodes of sepsis related to E. coli.  First developed sepsis after a prostate biopsy back in 2016 and he reports spending 3 to 4 years treating the infections, but he reports that he had been doing well over the last 1.5 years without any issues.  He had previously been following at [facility] for the issues with his prostate and infections.  Patient reports being told that his prostate cancer is slow-growing and ultimately his prostate needs to be taken out.  He has not followed up to pursue this any further since the pandemic started.  Assessment & Plan:   Principal Problem:   Sepsis ([place]) Active Problems:   GERD (gastroesophageal reflux disease)   Lower urinary tract infectious disease   History of ESBL E. coli infection   Prostate cancer ([place])   Obesity (BMI 30.0-34.9)  Sepsis secondary to urinary tract infect vs. prostatitis:  -Fever to 100.5 F -WBC trending up so need to wait at least 48 hours on blood cultures -urine culture growing 20K units bacteria so far -Review of previous records including most recently [facility] 09/2018 on care everywhere it appears patient has had multidrug-resistant E. coli susceptible to Zosyn, ertapenem, gentamicin, imipenem, meropenem, Zosyn, and tobramycin.  -Follow-up urine and blood cultures -meropenem continue pending sensitivity -Tylenol as needed for fever  Prostate cancer,  history of prostatitis, BPH: Patient reports having several biopsies done of his prostate.  He reports infection with ESBL started after his first biopsy in 2016.  Last done in 11/2018 at [facility] which showed multiple areas prostatic adenocarcinoma grade group 2 and one of grade 1.  He reports being told that prostate cancer was slow-growing.  He is not currently on Flomax. -Start Flomax -Needs to follow-up with urology  History of ESBL -Changed antibiotics as seen above  GERD:  -Continue protonix  Obesity: BMI 33 kg/m -Encourage patient on the need of weight loss.  DVT prophylaxis: Lovenox Code Status: full Family Communication: patient only Disposition Plan:  . Patient came from: home            . Anticipated d/c place: home . Barriers to d/c OR conditions which need to be met to effect a safe d/c: need to wait until blood cultures negative as WBC is still trending up, await urine culture sensitivity   Consultants:   none  Antimicrobials:   Meropenem start date 12/21/19  Subjective: Feeling much better than yesterday, having no burning with urination which he was having previously. Denies SOB or chest pains. Denies abdominal pain, diarrhea, constipation. Poor appetite. No fevers or chills overnight.   Objective: Vitals:   12/21/19 0940 12/21/19 1002 12/21/19 1636 12/21/19 2048  BP: 100/67 106/72 101/71 108/60  Pulse: 94 92 87 92  Resp: 17 20 18 20   Temp:  98.6 F (37 C) 100 F (37.8 C) 98.5 F (36.9 C)  TempSrc:  Oral Oral   SpO2: 97% 97% 99% 98%  Weight:  Height:        Intake/Output Summary (Last 24 hours) at 12/22/2019 1220 Last data filed at 12/22/2019 0545 Gross per 24 hour  Intake 450 ml  Output 1000 ml  Net -550 ml   Filed Weights   12/21/19 0549  Weight: 104.3 kg    Examination:  General exam: Appears calm and comfortable Respiratory system: Clear to auscultation. Respiratory effort normal. Cardiovascular system: S1 & S2 heard, RRR. No JVD,  murmurs, rubs, gallops or clicks. No pedal edema. Gastrointestinal system: Abdomen is nondistended, soft and nontender. No organomegaly or masses felt. Normal bowel sounds heard. Central nervous system: Alert and oriented. No focal neurological deficits. Extremities: Symmetric 5 x 5 power. Skin: No rashes, lesions or ulcers Psychiatry: Judgement and insight appear normal. Mood & affect appropriate.   Data Reviewed: I have personally reviewed following labs and imaging studies  CBC: Recent Labs  Lab 12/21/19 0554 12/22/19 0227  WBC 14.5* 20.0*  NEUTROABS 13.3*  --   HGB 16.4 12.8*  HCT 50.1 39.2  MCV 94.2 94.5  PLT 226 161   Basic Metabolic Panel: Recent Labs  Lab 12/21/19 0554 12/22/19 0227  NA 139 137  K 4.0 4.0  CL 104 103  CO2 23 23  GLUCOSE 119* 109*  BUN 21 17  CREATININE 1.20 1.15  CALCIUM 9.5 8.4*   GFR: Estimated Creatinine Clearance: 80.5 mL/min (by C-G formula based on SCr of 1.15 mg/dL). Liver Function Tests: Recent Labs  Lab 12/21/19 0554  AST 30  ALT 31  ALKPHOS 76  BILITOT 0.9  PROT 6.9  ALBUMIN 4.4   No results for input(s): LIPASE, AMYLASE in the last 168 hours. No results for input(s): AMMONIA in the last 168 hours. Coagulation Profile: Recent Labs  Lab 12/21/19 0554  INR 1.0   Cardiac Enzymes: No results for input(s): CKTOTAL, CKMB, CKMBINDEX, TROPONINI in the last 168 hours. BNP (last 3 results) No results for input(s): PROBNP in the last 8760 hours. HbA1C: No results for input(s): HGBA1C in the last 72 hours. CBG: No results for input(s): GLUCAP in the last 168 hours. Lipid Profile: No results for input(s): CHOL, HDL, LDLCALC, TRIG, CHOLHDL, LDLDIRECT in the last 72 hours. Thyroid Function Tests: No results for input(s): TSH, T4TOTAL, FREET4, T3FREE, THYROIDAB in the last 72 hours. Anemia Panel: No results for input(s): VITAMINB12, FOLATE, FERRITIN, TIBC, IRON, RETICCTPCT in the last 72 hours. Sepsis Labs: Recent Labs  Lab  12/21/19 0554 12/21/19 0746 12/21/19 1048 12/21/19 1310  LATICACIDVEN 2.4* 2.4* 1.7 3.2*    Recent Results (from the past 240 hour(s))  Blood Culture (routine x 2)     Status: None (Preliminary result)   Collection Time: 12/21/19  5:54 AM   Specimen: BLOOD LEFT ARM  Result Value Ref Range Status   Specimen Description BLOOD LEFT ARM  Final   Special Requests   Final    BOTTLES DRAWN AEROBIC AND ANAEROBIC Blood Culture adequate volume   Culture NO GROWTH < 12 HOURS  Final   Report Status PENDING  Incomplete  Blood Culture (routine x 2)     Status: None (Preliminary result)   Collection Time: 12/21/19  5:56 AM   Specimen: BLOOD LEFT HAND  Result Value Ref Range Status   Specimen Description BLOOD LEFT HAND  Final   Special Requests   Final    BOTTLES DRAWN AEROBIC ONLY Blood Culture adequate volume   Culture NO GROWTH < 12 HOURS  Final   Report Status PENDING  Incomplete  Urine culture     Status: Abnormal (Preliminary result)   Collection Time: 12/21/19  6:14 AM   Specimen: In/Out Cath Urine  Result Value Ref Range Status   Specimen Description IN/OUT CATH URINE  Final   Special Requests   Final    NONE Performed at [facility], [place] [address], [place], [place] [address]    Culture 20,000 COLONIES/mL GRAM NEGATIVE RODS (A)  Final   Report Status PENDING  Incomplete  Respiratory Panel by RT PCR (Flu A&B, Covid) - Nasopharyngeal Swab     Status: None   Collection Time: 12/21/19  7:24 AM   Specimen: Nasopharyngeal Swab  Result Value Ref Range Status   SARS Coronavirus 2 by RT PCR NEGATIVE NEGATIVE Final    Comment: (NOTE) SARS-CoV-2 target nucleic acids are NOT DETECTED. The SARS-CoV-2 RNA is generally detectable in upper respiratoy specimens during the acute phase of infection. The lowest concentration of SARS-CoV-2 viral copies this assay can detect is 131 copies/mL. A negative result does not preclude SARS-Cov-2 infection and should not be used as the sole basis  for treatment or other patient management decisions. A negative result may occur with  improper specimen collection/handling, submission of specimen other than nasopharyngeal swab, presence of viral mutation(s) within the areas targeted by this assay, and inadequate number of viral copies (<131 copies/mL). A negative result must be combined with clinical observations, patient history, and epidemiological information. The expected result is Negative. Fact Sheet for Patients:  PinkCheek.be Fact Sheet for Healthcare Providers:  GravelBags.it This test is not yet ap proved or cleared by the [place] FDA and  has been authorized for detection and/or diagnosis of SARS-CoV-2 by FDA under an Emergency Use Authorization (EUA). This EUA will remain  in effect (meaning this test can be used) for the duration of the COVID-19 declaration under Section 564(b)(1) of the Act, 21 U.S.C. section 360bbb-3(b)(1), unless the authorization is terminated or revoked sooner.    Influenza A by PCR NEGATIVE NEGATIVE Final   Influenza B by PCR NEGATIVE NEGATIVE Final    Comment: (NOTE) The Xpert Xpress SARS-CoV-2/FLU/RSV assay is intended as an aid in  the diagnosis of influenza from Nasopharyngeal swab specimens and  should not be used as a sole basis for treatment. Nasal washings and  aspirates are unacceptable for Xpert Xpress SARS-CoV-2/FLU/RSV  testing. Fact Sheet for Patients: PinkCheek.be Fact Sheet for Healthcare Providers: GravelBags.it This test is not yet approved or cleared by the [place] FDA and  has been authorized for detection and/or diagnosis of SARS-CoV-2 by  FDA under an Emergency Use Authorization (EUA). This EUA will remain  in effect (meaning this test can be used) for the duration of the  Covid-19 declaration under Section 564(b)(1) of the Act, 21  U.S.C.  section 360bbb-3(b)(1), unless the authorization is  terminated or revoked. Performed at [facility], [place] [address], [place], [place] [address]     Radiology Studies: CT ABDOMEN PELVIS W CONTRAST  Result Date: 12/21/2019 CLINICAL DATA:  64 year old male with abdominal and flank pain with dysuria. EXAM: CT ABDOMEN AND PELVIS WITH CONTRAST TECHNIQUE: Multidetector CT imaging of the abdomen and pelvis was performed using the standard protocol following bolus administration of intravenous contrast. CONTRAST:  132m OMNIPAQUE IOHEXOL 300 MG/ML  SOLN COMPARISON:  08/16/2016 CT and prior studies FINDINGS: Lower chest: No acute abnormality. Hepatobiliary: The liver and gallbladder are unremarkable. No biliary dilatation. Pancreas: Unremarkable Spleen: Unremarkable Adrenals/Urinary Tract: The kidneys, adrenal glands  and bladder are unremarkable. Stomach/Bowel: Stomach is within normal limits. Appendix appears normal. No evidence of bowel wall thickening, distention, or inflammatory changes. Vascular/Lymphatic: Aortic atherosclerosis. No enlarged abdominal or pelvic lymph nodes. Reproductive: Prostate enlargement is identified. Other: No ascites, abscess or pneumoperitoneum. Musculoskeletal: No acute or suspicious bony abnormalities identified. IMPRESSION: 1. No evidence of acute abnormality. 2. Prostate enlargement. 3. Aortic Atherosclerosis (ICD10-I70.0). Electronically Signed   By: Margarette Canada M.D.   On: 12/21/2019 07:28   DG Chest Port 1 View  Result Date: 12/21/2019 CLINICAL DATA:  Sepsis. EXAM: PORTABLE CHEST 1 VIEW COMPARISON:  July 25, 2015. FINDINGS: The heart size and mediastinal contours are within normal limits. No pneumothorax or pleural effusion is noted. Right lung is clear. Mild left lingular subsegmental atelectasis is noted. The visualized skeletal structures are unremarkable. IMPRESSION: Mild left lingular subsegmental atelectasis. Electronically Signed   By: Marijo Conception M.D.    On: 12/21/2019 14:18   Scheduled Meds: . enoxaparin (LOVENOX) injection  40 mg Subcutaneous Q24H  . fentaNYL (SUBLIMAZE) injection  50 mcg Intravenous Once  . pantoprazole  40 mg Oral Daily  . sodium chloride flush  3 mL Intravenous Q12H  . tamsulosin  0.4 mg Oral QPC breakfast   Continuous Infusions: . meropenem (MERREM) IV 1 g (12/22/19 0555)    LOS: 1 day   Time spent: 25  Hoyt Koch, MD [facility]  To contact the attending provider between 7A-7P or the covering provider during after hours 7P-7A, please log into the web site [email or web address] and access using universal [place] password for that web site. If you do not have the password, please call the hospital operator.  12/22/2019, 12:20 PM

## 2019-12-23 LAB — BASIC METABOLIC PANEL
Anion gap: 8 (ref 5–15)
BUN: 12 mg/dL (ref 8–23)
CO2: 24 mmol/L (ref 22–32)
Calcium: 8.6 mg/dL — ABNORMAL LOW (ref 8.9–10.3)
Chloride: 107 mmol/L (ref 98–111)
Creatinine, Ser: 1.06 mg/dL (ref 0.61–1.24)
GFR calc Af Amer: >60 mL/min (ref >60)
GFR calc non Af Amer: >60 mL/min (ref >60)
Glucose, Bld: 123 mg/dL — ABNORMAL HIGH (ref 70–99)
Potassium: 3.8 mmol/L (ref 3.5–5.1)
Sodium: 139 mmol/L (ref 135–145)

## 2019-12-23 LAB — URINE CULTURE: Culture: 20000 — AB

## 2019-12-23 LAB — CBC
HCT: 41 % (ref 39.0–52.0)
Hemoglobin: 13.3 g/dL (ref 13.0–17.0)
MCH: 30.9 pg (ref 26.0–34.0)
MCHC: 32.4 g/dL (ref 30.0–36.0)
MCV: 95.1 fL (ref 80.0–100.0)
Platelets: 169 10*3/uL (ref 150–400)
RBC: 4.31 MIL/uL (ref 4.22–5.81)
RDW: 13.3 % (ref 11.5–15.5)
WBC: 11.9 10*3/uL — ABNORMAL HIGH (ref 4.0–10.5)
nRBC: 0 % (ref 0.0–0.2)

## 2019-12-23 MED ORDER — FOSFOMYCIN TROMETHAMINE 3 G PO PACK: 3.0000 g | PACK | Freq: Once | ORAL | 0 refills | Status: AC

## 2019-12-23 NOTE — Discharge Instructions (Signed)
We have sent in fosfomycin to take for the urinary infection. This will treat the infection and we would like you to take that on 12/24/19.

## 2019-12-23 NOTE — Discharge Summary (Signed)
Physician Discharge Summary  Craig Butler [id number] DOB: 03/22/1956 DOA: 12/21/2019  PCP: Craig Cruel, MD  Admit date: 12/21/2019 Discharge date: 12/23/2019  Admitted From: home Disposition:  home  Recommendations for Outpatient Follow-up:  1. Follow up with PCP in 1-2 weeks 2. Please obtain BMP/CBC in one week 3. Please follow up on the following pending results: blood cultures  Home Health:none Equipment/Devices:none  Discharge Condition:improved CODE STATUS:full Diet recommendation: Heart Healthy  Brief/Interim Summary: [id number] J Flanaganis a 64 y.o.malewith medical history significant ofrecurrentprostate cancer,BPH, prostatitis, recurrent UTIs, and ESBL E. coli sepsis. Patient presents after acutely waking up around 2-3 AM with subjective fever and rigors. Noted associated symptoms of dysuria, abdominal cramping, and some lower right back pain. Denies any nausea, vomiting, diarrhea chest pain, shortness of breath, or cough. Symptoms felt similar to previous episodes of sepsis related to E. coli. First developed sepsis after a prostate biopsy back in 2016 and he reports spending 3 to 4 years treating the infections, but he reports that he had been doing well over the last 1.5years without any issues. He had previously been following at [facility] for the issues with his prostate and infections. Patient reports being told that his prostate cancer is slow-growing and ultimately his prostate needs to be taken out. He has not followed up to pursue this any further since the pandemic started.  Hospital course: Treated with IV meropenem given past sensitivities to UTI. He did have good clinical improvement and recovered enough to go home 2 days later. Was eating well, drinking well. No more burning with urination and no abdominal pain. Never had back pain associated. Discharged home with dose of fosfomycin given sensitivity results of urine culture which were available  prior to discharge with resistance to bactrim, penicillins, cephalosporins. Blood cultures negative to date but still pending at time of discharge.   Discharge Diagnoses:  Principal Problem:   Sepsis ([place]) Active Problems:   GERD (gastroesophageal reflux disease)   Lower urinary tract infectious disease   History of ESBL E. coli infection   Prostate cancer ([place])   Obesity (BMI 30.0-34.9)  Discharge Instructions  Discharge Instructions    Call MD for:  persistant nausea and vomiting   Complete by: As directed    Call MD for:  severe uncontrolled pain   Complete by: As directed    Call MD for:  temperature >100.4   Complete by: As directed    Diet - low sodium heart healthy   Complete by: As directed    Increase activity slowly   Complete by: As directed      Allergies as of 12/23/2019      Reactions   Amoxicillin Rash      Medication List    TAKE these medications   fosfomycin 3 g Pack Commonly known as: MONUROL Take 3 g by mouth once for 1 dose. Start taking on: December 24, 2019   omeprazole 20 MG capsule Commonly known as: PRILOSEC Take 20 mg by mouth daily.   tamsulosin 0.4 MG Caps capsule Commonly known as: FLOMAX Take 1 capsule (0.4 mg total) by mouth daily.      Follow-up Information    Craig Cruel, MD. Schedule an appointment as soon as possible for a visit in 1 week(s).   Specialty: Family Medicine Contact information: [id number] [place] [address] [phone]          Allergies  Allergen Reactions  . Amoxicillin Rash    Consultations:  none  Procedures/Studies: CT ABDOMEN PELVIS W CONTRAST  Result Date: 12/21/2019 CLINICAL DATA:  64 year old male with abdominal and flank pain with dysuria. EXAM: CT ABDOMEN AND PELVIS WITH CONTRAST TECHNIQUE: Multidetector CT imaging of the abdomen and pelvis was performed using the standard protocol following bolus administration of intravenous contrast. CONTRAST:  133mL OMNIPAQUE  IOHEXOL 300 MG/ML  SOLN COMPARISON:  08/16/2016 CT and prior studies FINDINGS: Lower chest: No acute abnormality. Hepatobiliary: The liver and gallbladder are unremarkable. No biliary dilatation. Pancreas: Unremarkable Spleen: Unremarkable Adrenals/Urinary Tract: The kidneys, adrenal glands and bladder are unremarkable. Stomach/Bowel: Stomach is within normal limits. Appendix appears normal. No evidence of bowel wall thickening, distention, or inflammatory changes. Vascular/Lymphatic: Aortic atherosclerosis. No enlarged abdominal or pelvic lymph nodes. Reproductive: Prostate enlargement is identified. Other: No ascites, abscess or pneumoperitoneum. Musculoskeletal: No acute or suspicious bony abnormalities identified. IMPRESSION: 1. No evidence of acute abnormality. 2. Prostate enlargement. 3. Aortic Atherosclerosis (ICD10-I70.0). Electronically Signed   By: Margarette Canada M.D.   On: 12/21/2019 07:28   DG Chest Port 1 View  Result Date: 12/21/2019 CLINICAL DATA:  Sepsis. EXAM: PORTABLE CHEST 1 VIEW COMPARISON:  July 25, 2015. FINDINGS: The heart size and mediastinal contours are within normal limits. No pneumothorax or pleural effusion is noted. Right lung is clear. Mild left lingular subsegmental atelectasis is noted. The visualized skeletal structures are unremarkable. IMPRESSION: Mild left lingular subsegmental atelectasis. Electronically Signed   By: Marijo Conception M.D.   On: 12/21/2019 14:18    Subjective: Feeling well and ready to go home. Up and walking around room without problems. Denies chest pains or SOB. No recurrent burning with urination. No new concerns.   Discharge Exam: Vitals:   12/22/19 2227 12/23/19 0528  BP: 102/61 122/82  Pulse: 84 68  Resp: 18 18  Temp: 98.8 F (37.1 C) 98.3 F (36.8 C)  SpO2: 98% 97%   Vitals:   12/21/19 2048 12/22/19 1510 12/22/19 2227 12/23/19 0528  BP: 108/60 (!) 91/36 102/61 122/82  Pulse: 92 87 84 68  Resp: 20 18 18 18   Temp: 98.5 F (36.9 C)  99.3 F (37.4 C) 98.8 F (37.1 C) 98.3 F (36.8 C)  TempSrc:  Oral Oral Oral  SpO2: 98% 96% 98% 97%  Weight:      Height:        General: Pt is alert, awake, not in acute distress Cardiovascular: RRR, S1/S2 +, no rubs, no gallops Respiratory: CTA bilaterally, no wheezing, no rhonchi Abdominal: Soft, NT, ND, bowel sounds + Extremities: no edema, no cyanosis  The results of significant diagnostics from this hospitalization (including imaging, microbiology, ancillary and laboratory) are listed below for reference.     Microbiology: Recent Results (from the past 240 hour(s))  Blood Culture (routine x 2)     Status: None (Preliminary result)   Collection Time: 12/21/19  5:54 AM   Specimen: BLOOD LEFT ARM  Result Value Ref Range Status   Specimen Description BLOOD LEFT ARM  Final   Special Requests   Final    BOTTLES DRAWN AEROBIC AND ANAEROBIC Blood Culture adequate volume   Culture   Final    NO GROWTH 2 DAYS Performed at [facility], [address], [place], [place] [address]    Report Status PENDING  Incomplete  Blood Culture (routine x 2)     Status: None (Preliminary result)   Collection Time: 12/21/19  5:56 AM   Specimen: BLOOD LEFT HAND  Result Value Ref Range Status  Specimen Description BLOOD LEFT HAND  Final   Special Requests   Final    BOTTLES DRAWN AEROBIC ONLY Blood Culture adequate volume   Culture   Final    NO GROWTH 2 DAYS Performed at [facility], [address] [place], [place] [address]    Report Status PENDING  Incomplete  Urine culture     Status: Abnormal   Collection Time: 12/21/19  6:14 AM   Specimen: In/Out Cath Urine  Result Value Ref Range Status   Specimen Description IN/OUT CATH URINE  Final   Special Requests   Final    NONE Performed at [facility], [place] [address] [place], [place] [address]    Culture (A)  Final    20,000 COLONIES/mL ESCHERICHIA COLI Confirmed Extended Spectrum Beta-Lactamase Producer (ESBL).   In bloodstream infections from ESBL organisms, carbapenems are preferred over piperacillin/tazobactam. They are shown to have a lower risk of mortality.    Report Status 12/23/2019 FINAL  Final   Organism ID, Bacteria ESCHERICHIA COLI (A)  Final      Susceptibility   Escherichia coli - MIC*    AMPICILLIN >=32 RESISTANT Resistant     CEFAZOLIN >=64 RESISTANT Resistant     CEFTRIAXONE >=64 RESISTANT Resistant     CIPROFLOXACIN >=4 RESISTANT Resistant     GENTAMICIN <=1 SENSITIVE Sensitive     IMIPENEM <=0.25 SENSITIVE Sensitive     NITROFURANTOIN <=16 SENSITIVE Sensitive     TRIMETH/SULFA >=320 RESISTANT Resistant     AMPICILLIN/SULBACTAM 8 SENSITIVE Sensitive     PIP/TAZO <=4 SENSITIVE Sensitive     * 20,000 COLONIES/mL ESCHERICHIA COLI  Respiratory Panel by RT PCR (Flu A&B, Covid) - Nasopharyngeal Swab     Status: None   Collection Time: 12/21/19  7:24 AM   Specimen: Nasopharyngeal Swab  Result Value Ref Range Status   SARS Coronavirus 2 by RT PCR NEGATIVE NEGATIVE Final    Comment: (NOTE) SARS-CoV-2 target nucleic acids are NOT DETECTED. The SARS-CoV-2 RNA is generally detectable in upper respiratoy specimens during the acute phase of infection. The lowest concentration of SARS-CoV-2 viral copies this assay can detect is 131 copies/mL. A negative result does not preclude SARS-Cov-2 infection and should not be used as the sole basis for treatment or other patient management decisions. A negative result may occur with  improper specimen collection/handling, submission of specimen other than nasopharyngeal swab, presence of viral mutation(s) within the areas targeted by this assay, and inadequate number of viral copies (<131 copies/mL). A negative result must be combined with clinical observations, patient history, and epidemiological information. The expected result is Negative. Fact Sheet for Patients:  PinkCheek.be Fact Sheet for Healthcare  Providers:  GravelBags.it This test is not yet ap proved or cleared by the [place] FDA and  has been authorized for detection and/or diagnosis of SARS-CoV-2 by FDA under an Emergency Use Authorization (EUA). This EUA will remain  in effect (meaning this test can be used) for the duration of the COVID-19 declaration under Section 564(b)(1) of the Act, 21 U.S.C. section 360bbb-3(b)(1), unless the authorization is terminated or revoked sooner.    Influenza A by PCR NEGATIVE NEGATIVE Final   Influenza B by PCR NEGATIVE NEGATIVE Final    Comment: (NOTE) The Xpert Xpress SARS-CoV-2/FLU/RSV assay is intended as an aid in  the diagnosis of influenza from Nasopharyngeal swab specimens and  should not be used as a sole basis for treatment. Nasal washings and  aspirates are unacceptable  for Xpert Xpress SARS-CoV-2/FLU/RSV  testing. Fact Sheet for Patients: PinkCheek.be Fact Sheet for Healthcare Providers: GravelBags.it This test is not yet approved or cleared by the [place] FDA and  has been authorized for detection and/or diagnosis of SARS-CoV-2 by  FDA under an Emergency Use Authorization (EUA). This EUA will remain  in effect (meaning this test can be used) for the duration of the  Covid-19 declaration under Section 564(b)(1) of the Act, 21  U.S.C. section 360bbb-3(b)(1), unless the authorization is  terminated or revoked. Performed at [facility], [place] [address] [place], [place] [address]      Labs: BNP (last 3 results) No results for input(s): BNP in the last 8760 hours. Basic Metabolic Panel: Recent Labs  Lab 12/21/19 0554 12/22/19 0227 12/23/19 0157  NA 139 137 139  K 4.0 4.0 3.8  CL 104 103 107  CO2 23 23 24   GLUCOSE 119* 109* 123*  BUN 21 17 12   CREATININE 1.20 1.15 1.06  CALCIUM 9.5 8.4* 8.6*   Liver Function Tests: Recent Labs  Lab 12/21/19 0554  AST 30   ALT 31  ALKPHOS 76  BILITOT 0.9  PROT 6.9  ALBUMIN 4.4   No results for input(s): LIPASE, AMYLASE in the last 168 hours. No results for input(s): AMMONIA in the last 168 hours. CBC: Recent Labs  Lab 12/21/19 0554 12/22/19 0227 12/23/19 0157  WBC 14.5* 20.0* 11.9*  NEUTROABS 13.3*  --   --   HGB 16.4 12.8* 13.3  HCT 50.1 39.2 41.0  MCV 94.2 94.5 95.1  PLT 226 172 169   Cardiac Enzymes: No results for input(s): CKTOTAL, CKMB, CKMBINDEX, TROPONINI in the last 168 hours. BNP: Invalid input(s): POCBNP CBG: No results for input(s): GLUCAP in the last 168 hours. D-Dimer No results for input(s): DDIMER in the last 72 hours. Hgb A1c No results for input(s): HGBA1C in the last 72 hours. Lipid Profile No results for input(s): CHOL, HDL, LDLCALC, TRIG, CHOLHDL, LDLDIRECT in the last 72 hours. Thyroid function studies No results for input(s): TSH, T4TOTAL, T3FREE, THYROIDAB in the last 72 hours.  Invalid input(s): FREET3 Anemia work up No results for input(s): VITAMINB12, FOLATE, FERRITIN, TIBC, IRON, RETICCTPCT in the last 72 hours. Urinalysis    Component Value Date/Time   COLORURINE YELLOW 12/21/2019 0602   APPEARANCEUR CLEAR 12/21/2019 0602   LABSPEC 1.017 12/21/2019 0602   PHURINE 9.0 (H) 12/21/2019 0602   GLUCOSEU NEGATIVE 12/21/2019 0602   HGBUR NEGATIVE 12/21/2019 0602   BILIRUBINUR NEGATIVE 12/21/2019 0602   KETONESUR NEGATIVE 12/21/2019 0602   PROTEINUR NEGATIVE 12/21/2019 0602   UROBILINOGEN 0.2 08/13/2015 0619   NITRITE NEGATIVE 12/21/2019 0602   LEUKOCYTESUR SMALL (A) 12/21/2019 0602   Sepsis Labs Invalid input(s): PROCALCITONIN,  WBC,  LACTICIDVEN Microbiology Recent Results (from the past 240 hour(s))  Blood Culture (routine x 2)     Status: None (Preliminary result)   Collection Time: 12/21/19  5:54 AM   Specimen: BLOOD LEFT ARM  Result Value Ref Range Status   Specimen Description BLOOD LEFT ARM  Final   Special Requests   Final    BOTTLES DRAWN  AEROBIC AND ANAEROBIC Blood Culture adequate volume   Culture   Final    NO GROWTH 2 DAYS Performed at [facility], [address] [place], [place] [address]    Report Status PENDING  Incomplete  Blood Culture (routine x 2)     Status: None (Preliminary result)   Collection Time: 12/21/19  5:56 AM   Specimen: BLOOD LEFT HAND  Result Value Ref Range Status   Specimen Description BLOOD LEFT HAND  Final   Special Requests   Final    BOTTLES DRAWN AEROBIC ONLY Blood Culture adequate volume   Culture   Final    NO GROWTH 2 DAYS Performed at [facility], [address] [place], [place] [address]    Report Status PENDING  Incomplete  Urine culture     Status: Abnormal   Collection Time: 12/21/19  6:14 AM   Specimen: In/Out Cath Urine  Result Value Ref Range Status   Specimen Description IN/OUT CATH URINE  Final   Special Requests   Final    NONE Performed at [facility], [place] [address] [place], [place] [address]    Culture (A)  Final    20,000 COLONIES/mL ESCHERICHIA COLI Confirmed Extended Spectrum Beta-Lactamase Producer (ESBL).  In bloodstream infections from ESBL organisms, carbapenems are preferred over piperacillin/tazobactam. They are shown to have a lower risk of mortality.    Report Status 12/23/2019 FINAL  Final   Organism ID, Bacteria ESCHERICHIA COLI (A)  Final      Susceptibility   Escherichia coli - MIC*    AMPICILLIN >=32 RESISTANT Resistant     CEFAZOLIN >=64 RESISTANT Resistant     CEFTRIAXONE >=64 RESISTANT Resistant     CIPROFLOXACIN >=4 RESISTANT Resistant     GENTAMICIN <=1 SENSITIVE Sensitive     IMIPENEM <=0.25 SENSITIVE Sensitive     NITROFURANTOIN <=16 SENSITIVE Sensitive     TRIMETH/SULFA >=320 RESISTANT Resistant     AMPICILLIN/SULBACTAM 8 SENSITIVE Sensitive     PIP/TAZO <=4 SENSITIVE Sensitive     * 20,000 COLONIES/mL ESCHERICHIA COLI  Respiratory Panel by RT PCR (Flu A&B, Covid) - Nasopharyngeal Swab     Status: None    Collection Time: 12/21/19  7:24 AM   Specimen: Nasopharyngeal Swab  Result Value Ref Range Status   SARS Coronavirus 2 by RT PCR NEGATIVE NEGATIVE Final    Comment: (NOTE) SARS-CoV-2 target nucleic acids are NOT DETECTED. The SARS-CoV-2 RNA is generally detectable in upper respiratoy specimens during the acute phase of infection. The lowest concentration of SARS-CoV-2 viral copies this assay can detect is 131 copies/mL. A negative result does not preclude SARS-Cov-2 infection and should not be used as the sole basis for treatment or other patient management decisions. A negative result may occur with  improper specimen collection/handling, submission of specimen other than nasopharyngeal swab, presence of viral mutation(s) within the areas targeted by this assay, and inadequate number of viral copies (<131 copies/mL). A negative result must be combined with clinical observations, patient history, and epidemiological information. The expected result is Negative. Fact Sheet for Patients:  PinkCheek.be Fact Sheet for Healthcare Providers:  GravelBags.it This test is not yet ap proved or cleared by the [place] FDA and  has been authorized for detection and/or diagnosis of SARS-CoV-2 by FDA under an Emergency Use Authorization (EUA). This EUA will remain  in effect (meaning this test can be used) for the duration of the COVID-19 declaration under Section 564(b)(1) of the Act, 21 U.S.C. section 360bbb-3(b)(1), unless the authorization is terminated or revoked sooner.    Influenza A by PCR NEGATIVE NEGATIVE Final   Influenza B by PCR NEGATIVE NEGATIVE Final    Comment: (NOTE) The Xpert Xpress SARS-CoV-2/FLU/RSV assay is intended as an aid in  the diagnosis of influenza from Nasopharyngeal swab specimens and  should not  be used as a sole basis for treatment. Nasal washings and  aspirates are unacceptable for Xpert Xpress  SARS-CoV-2/FLU/RSV  testing. Fact Sheet for Patients: PinkCheek.be Fact Sheet for Healthcare Providers: GravelBags.it This test is not yet approved or cleared by the [place] FDA and  has been authorized for detection and/or diagnosis of SARS-CoV-2 by  FDA under an Emergency Use Authorization (EUA). This EUA will remain  in effect (meaning this test can be used) for the duration of the  Covid-19 declaration under Section 564(b)(1) of the Act, 21  U.S.C. section 360bbb-3(b)(1), unless the authorization is  terminated or revoked. Performed at [facility], [place] [address], [place], [place] [address]      Time coordinating discharge: Over 30 minutes  SIGNED:   Hoyt Koch, MD  [facility] 12/23/2019, 11:01 AM Pager   If 7PM-7AM, please contact night-coverage [email or web address] Password [email or web address]

## 2019-12-26 LAB — CULTURE, BLOOD (ROUTINE X 2)
Culture: NO GROWTH
Culture: NO GROWTH
Special Requests: ADEQUATE
Special Requests: ADEQUATE

## 2020-04-08 ENCOUNTER — Other Ambulatory Visit: Payer: Self-pay

## 2020-04-08 ENCOUNTER — Inpatient Hospital Stay (HOSPITAL_COMMUNITY)
Admission: EM | Admit: 2020-04-08 | Discharge: 2020-04-13 | DRG: 872 | Disposition: A | Discharge status: Home Health | Payer: 59 | Source: Ambulatory Visit | Attending: Internal Medicine | Admitting: Internal Medicine

## 2020-04-08 ENCOUNTER — Emergency Department (HOSPITAL_COMMUNITY): Payer: 59

## 2020-04-08 DIAGNOSIS — G43809 Other migraine, not intractable, without status migrainosus: Secondary | ICD-10-CM | POA: Diagnosis not present

## 2020-04-08 DIAGNOSIS — I480 Paroxysmal atrial fibrillation: Secondary | ICD-10-CM | POA: Diagnosis not present

## 2020-04-08 DIAGNOSIS — G43909 Migraine, unspecified, not intractable, without status migrainosus: Secondary | ICD-10-CM | POA: Diagnosis present

## 2020-04-08 DIAGNOSIS — F419 Anxiety disorder, unspecified: Secondary | ICD-10-CM

## 2020-04-08 DIAGNOSIS — Z6833 Body mass index (BMI) 33.0-33.9, adult: Secondary | ICD-10-CM | POA: Diagnosis not present

## 2020-04-08 DIAGNOSIS — N390 Urinary tract infection, site not specified: Secondary | ICD-10-CM | POA: Insufficient documentation

## 2020-04-08 DIAGNOSIS — N400 Enlarged prostate without lower urinary tract symptoms: Secondary | ICD-10-CM | POA: Diagnosis present

## 2020-04-08 DIAGNOSIS — E878 Other disorders of electrolyte and fluid balance, not elsewhere classified: Secondary | ICD-10-CM | POA: Diagnosis not present

## 2020-04-08 DIAGNOSIS — R6520 Severe sepsis without septic shock: Secondary | ICD-10-CM | POA: Diagnosis present

## 2020-04-08 DIAGNOSIS — R0789 Other chest pain: Secondary | ICD-10-CM | POA: Diagnosis not present

## 2020-04-08 DIAGNOSIS — Z20822 Contact with and (suspected) exposure to covid-19: Secondary | ICD-10-CM | POA: Diagnosis present

## 2020-04-08 DIAGNOSIS — E669 Obesity, unspecified: Secondary | ICD-10-CM

## 2020-04-08 DIAGNOSIS — Z87440 Personal history of urinary (tract) infections: Secondary | ICD-10-CM | POA: Diagnosis not present

## 2020-04-08 DIAGNOSIS — A4151 Sepsis due to Escherichia coli [E. coli]: Principal | ICD-10-CM | POA: Diagnosis present

## 2020-04-08 DIAGNOSIS — Z880 Allergy status to penicillin: Secondary | ICD-10-CM | POA: Diagnosis not present

## 2020-04-08 DIAGNOSIS — D631 Anemia in chronic kidney disease: Secondary | ICD-10-CM | POA: Diagnosis present

## 2020-04-08 DIAGNOSIS — A4150 Gram-negative sepsis, unspecified: Secondary | ICD-10-CM

## 2020-04-08 DIAGNOSIS — Z79899 Other long term (current) drug therapy: Secondary | ICD-10-CM | POA: Diagnosis not present

## 2020-04-08 DIAGNOSIS — J9811 Atelectasis: Secondary | ICD-10-CM | POA: Diagnosis present

## 2020-04-08 DIAGNOSIS — C61 Malignant neoplasm of prostate: Secondary | ICD-10-CM | POA: Diagnosis present

## 2020-04-08 DIAGNOSIS — K219 Gastro-esophageal reflux disease without esophagitis: Secondary | ICD-10-CM | POA: Diagnosis present

## 2020-04-08 DIAGNOSIS — D6959 Other secondary thrombocytopenia: Secondary | ICD-10-CM | POA: Diagnosis present

## 2020-04-08 DIAGNOSIS — Z8619 Personal history of other infectious and parasitic diseases: Secondary | ICD-10-CM

## 2020-04-08 DIAGNOSIS — Z87891 Personal history of nicotine dependence: Secondary | ICD-10-CM

## 2020-04-08 DIAGNOSIS — N179 Acute kidney failure, unspecified: Secondary | ICD-10-CM | POA: Diagnosis present

## 2020-04-08 DIAGNOSIS — A419 Sepsis, unspecified organism: Secondary | ICD-10-CM

## 2020-04-08 DIAGNOSIS — E872 Acidosis: Secondary | ICD-10-CM | POA: Diagnosis present

## 2020-04-08 DIAGNOSIS — R6521 Severe sepsis with septic shock: Secondary | ICD-10-CM | POA: Diagnosis not present

## 2020-04-08 DIAGNOSIS — F329 Major depressive disorder, single episode, unspecified: Secondary | ICD-10-CM | POA: Diagnosis not present

## 2020-04-08 DIAGNOSIS — E66811 Obesity, class 1: Secondary | ICD-10-CM | POA: Diagnosis present

## 2020-04-08 DIAGNOSIS — F32A Depression, unspecified: Secondary | ICD-10-CM | POA: Diagnosis present

## 2020-04-08 DIAGNOSIS — N182 Chronic kidney disease, stage 2 (mild): Secondary | ICD-10-CM | POA: Diagnosis present

## 2020-04-08 DIAGNOSIS — R7989 Other specified abnormal findings of blood chemistry: Secondary | ICD-10-CM

## 2020-04-08 DIAGNOSIS — I4891 Unspecified atrial fibrillation: Secondary | ICD-10-CM | POA: Diagnosis not present

## 2020-04-08 HISTORY — DX: Malignant neoplasm of prostate: C61

## 2020-04-08 HISTORY — DX: Urinary tract infection, site not specified: N39.0

## 2020-04-08 HISTORY — DX: Benign prostatic hyperplasia without lower urinary tract symptoms: N40.0

## 2020-04-08 LAB — CBC WITH DIFFERENTIAL/PLATELET
Abs Immature Granulocytes: 0.1 10*3/uL — ABNORMAL HIGH (ref 0.00–0.07)
Basophils Absolute: 0 10*3/uL (ref 0.0–0.1)
Basophils Relative: 0 %
Eosinophils Absolute: 0 10*3/uL (ref 0.0–0.5)
Eosinophils Relative: 0 %
HCT: 48.8 % (ref 39.0–52.0)
Hemoglobin: 16 g/dL (ref 13.0–17.0)
Immature Granulocytes: 1 %
Lymphocytes Relative: 2 %
Lymphs Abs: 0.4 10*3/uL — ABNORMAL LOW (ref 0.7–4.0)
MCH: 30.7 pg (ref 26.0–34.0)
MCHC: 32.8 g/dL (ref 30.0–36.0)
MCV: 93.5 fL (ref 80.0–100.0)
Monocytes Absolute: 1.1 10*3/uL — ABNORMAL HIGH (ref 0.1–1.0)
Monocytes Relative: 6 %
Neutro Abs: 17.2 10*3/uL — ABNORMAL HIGH (ref 1.7–7.7)
Neutrophils Relative %: 91 %
Platelets: 223 10*3/uL (ref 150–400)
RBC: 5.22 MIL/uL (ref 4.22–5.81)
RDW: 12.9 % (ref 11.5–15.5)
WBC: 18.9 10*3/uL — ABNORMAL HIGH (ref 4.0–10.5)
nRBC: 0 % (ref 0.0–0.2)

## 2020-04-08 LAB — COMPREHENSIVE METABOLIC PANEL
ALT: 20 U/L (ref 0–44)
AST: 21 U/L (ref 15–41)
Albumin: 4.5 g/dL (ref 3.5–5.0)
Alkaline Phosphatase: 82 U/L (ref 38–126)
Anion gap: 12 (ref 5–15)
BUN: 16 mg/dL (ref 8–23)
CO2: 20 mmol/L — ABNORMAL LOW (ref 22–32)
Calcium: 9.3 mg/dL (ref 8.9–10.3)
Chloride: 106 mmol/L (ref 98–111)
Creatinine, Ser: 1.42 mg/dL — ABNORMAL HIGH (ref 0.61–1.24)
GFR calc Af Amer: >60 mL/min (ref >60)
GFR calc non Af Amer: 53 mL/min — ABNORMAL LOW (ref >60)
Glucose, Bld: 108 mg/dL — ABNORMAL HIGH (ref 70–99)
Potassium: 4.2 mmol/L (ref 3.5–5.1)
Sodium: 138 mmol/L (ref 135–145)
Total Bilirubin: 1.5 mg/dL — ABNORMAL HIGH (ref 0.3–1.2)
Total Protein: 7.1 g/dL (ref 6.5–8.1)

## 2020-04-08 LAB — PROTIME-INR
INR: 1.1 (ref 0.8–1.2)
Prothrombin Time: 13.9 seconds (ref 11.4–15.2)

## 2020-04-08 LAB — URINALYSIS, ROUTINE W REFLEX MICROSCOPIC
Bilirubin Urine: NEGATIVE
Glucose, UA: NEGATIVE mg/dL
Hgb urine dipstick: NEGATIVE
Ketones, ur: NEGATIVE mg/dL
Nitrite: NEGATIVE
Protein, ur: NEGATIVE mg/dL
Specific Gravity, Urine: 1.016 (ref 1.005–1.030)
WBC, UA: >50 WBC/hpf — ABNORMAL HIGH (ref 0–5)
pH: 5 (ref 5.0–8.0)

## 2020-04-08 LAB — LACTIC ACID, PLASMA
Lactic Acid, Venous: 2.3 mmol/L (ref 0.5–1.9)
Lactic Acid, Venous: 1.9 mmol/L (ref 0.5–1.9)

## 2020-04-08 LAB — SARS CORONAVIRUS 2 BY RT PCR (HOSPITAL ORDER, PERFORMED IN ~~LOC~~ HOSPITAL LAB): SARS Coronavirus 2: NEGATIVE

## 2020-04-08 LAB — APTT: aPTT: 33 seconds (ref 24–36)

## 2020-04-08 MED ORDER — ONDANSETRON HCL 4 MG/2ML IJ SOLN: 4.0000 mg | Freq: Four times a day (QID) | INTRAMUSCULAR | Status: DC | PRN

## 2020-04-08 MED ORDER — ADULT MULTIVITAMIN W/MINERALS CH
1.0000 | ORAL_TABLET | Freq: Every day | ORAL | Status: DC
  Administered 2020-04-08 – 2020-04-13 (×6): 1 via ORAL
  Filled 2020-04-08 (×6): qty 1

## 2020-04-08 MED ORDER — SODIUM CHLORIDE 0.9 % IV BOLUS (SEPSIS)
1000.0000 mL | Freq: Once | INTRAVENOUS | Status: AC
  Administered 2020-04-08: 1000 mL via INTRAVENOUS

## 2020-04-08 MED ORDER — ONDANSETRON HCL 4 MG PO TABS: 4.0000 mg | ORAL_TABLET | Freq: Four times a day (QID) | ORAL | Status: DC | PRN

## 2020-04-08 MED ORDER — ACETAMINOPHEN 650 MG RE SUPP: 650.0000 mg | Freq: Four times a day (QID) | RECTAL | Status: DC | PRN

## 2020-04-08 MED ORDER — SODIUM CHLORIDE 0.9 % IV BOLUS (SEPSIS): 500.0000 mL | Freq: Once | INTRAVENOUS | Status: DC

## 2020-04-08 MED ORDER — SODIUM CHLORIDE 0.9 % IV SOLN
1.0000 g | Freq: Once | INTRAVENOUS | Status: AC
  Administered 2020-04-08: 1 g via INTRAVENOUS
  Filled 2020-04-08: qty 1

## 2020-04-08 MED ORDER — PANTOPRAZOLE SODIUM 40 MG PO TBEC
40.0000 mg | DELAYED_RELEASE_TABLET | Freq: Every day | ORAL | Status: DC
  Administered 2020-04-08 – 2020-04-13 (×6): 40 mg via ORAL
  Filled 2020-04-08 (×6): qty 1

## 2020-04-08 MED ORDER — SODIUM CHLORIDE 0.9 % IV SOLN
1.0000 g | Freq: Three times a day (TID) | INTRAVENOUS | Status: DC
  Administered 2020-04-08 – 2020-04-13 (×14): 1 g via INTRAVENOUS
  Filled 2020-04-08 (×18): qty 1

## 2020-04-08 MED ORDER — TAMSULOSIN HCL 0.4 MG PO CAPS
0.4000 mg | ORAL_CAPSULE | Freq: Every day | ORAL | Status: DC
  Administered 2020-04-08 – 2020-04-13 (×6): 0.4 mg via ORAL
  Filled 2020-04-08 (×6): qty 1

## 2020-04-08 MED ORDER — FLAVOXATE HCL 100 MG PO TABS
100.0000 mg | ORAL_TABLET | Freq: Three times a day (TID) | ORAL | Status: DC | PRN
  Administered 2020-04-08: 100 mg via ORAL
  Filled 2020-04-08 (×3): qty 1

## 2020-04-08 MED ORDER — ENOXAPARIN SODIUM 40 MG/0.4ML ~~LOC~~ SOLN
40.0000 mg | SUBCUTANEOUS | Status: DC
  Administered 2020-04-08 – 2020-04-12 (×5): 40 mg via SUBCUTANEOUS
  Filled 2020-04-08 (×5): qty 0.4

## 2020-04-08 MED ORDER — LACTATED RINGERS IV SOLN: INTRAVENOUS | Status: DC

## 2020-04-08 MED ORDER — ACETAMINOPHEN 325 MG PO TABS
650.0000 mg | ORAL_TABLET | Freq: Four times a day (QID) | ORAL | Status: DC | PRN
  Administered 2020-04-08 – 2020-04-13 (×7): 650 mg via ORAL
  Filled 2020-04-08 (×8): qty 2

## 2020-04-08 NOTE — ED Notes (Signed)
Dr. Cathlean Sauer paged to [place], RN paged by Levada Dy

## 2020-04-08 NOTE — H&P (Addendum)
History and Physical    Craig Butler [id number] DOB: [birth year] DOA: 04/08/2020  PCP: Lawerance Cruel, MD   Patient coming from: Home  Chief Complaint: dysuria and fever.   HPI: Craig Butler is a 64 y.o. male with medical history significant of benign prostatic hypertrophy and prostate cancer with recurrent urinary tract infections/ prostatitis.  He also has GERD, depression and migraines. Patient was at his usual state of health until this morning when he experienced dysuria and increased urinary frequency, moderate to severe in intensity, associated with fevers and chills.  No improving or worsening factors.  He has had urinary infections before that lead to sepsis E coli ESBL).  He recognized his symptoms, and went to his primary care provider who referred him to the hospital for further evaluation.  His last hospitalization was in February, from the second to the ninth, with a working diagnosis of sepsis due to urine infection, treated with meropenem and fosfomycin.  Patient has been fully vaccinated for COVID-19.  ED Course: Patient was found tachycardic and tachypneic, positive pyuria and leukocytosis along with elevated lactic acid.  He received 3500 cc of IV isotonic saline and intravenous meropenem, then referred for further hospitalization.  Review of Systems:  1. General: positive fevers, and chills, as mentioned in HPI, no weight gain or weight loss 2. ENT: No runny nose or sore throat, no hearing disturbances 3. Pulmonary: No dyspnea, cough, wheezing, or hemoptysis 4. Cardiovascular: No angina, claudication, lower extremity edema, pnd or orthopnea 5. Gastrointestinal: No nausea or vomiting, no diarrhea or constipation 6. Hematology: No easy bruisability or frequent infections 7. Urology: No dysuria, hematuria or increased urinary frequency 8. Dermatology: No rashes. 9. Neurology: No seizures or paresthesias 10. Musculoskeletal: No joint pain or  deformities  Past Medical History:  Diagnosis Date  . Acid reflux   . Anxiety   . Depression   . Migraine     Past Surgical History:  Procedure Laterality Date  . BIOPSY PROSTATE       reports that he has quit smoking. He does not have any smokeless tobacco history on file. He reports current alcohol use. No history on file for drug.  Allergies  Allergen Reactions  . Amoxicillin Rash    Family History  Problem Relation Age of Onset  . Cancer Father   . Cancer Brother      Prior to Admission medications   Medication Sig Start Date End Date Taking? Authorizing Provider  Ascorbic Acid (VITAMIN C) 1000 MG tablet Take 1,000 mg by mouth daily.   Yes [provider]  Cyanocobalamin (VITAMIN B 12 PO) Take 1 capsule by mouth daily.   Yes [provider]  fluticasone (FLONASE) 50 MCG/ACT nasal spray Place 1 spray into both nostrils daily as needed for allergies or rhinitis.   Yes [provider]  Multiple Vitamin (MULTIVITAMIN WITH MINERALS) TABS tablet Take 1 tablet by mouth daily.   Yes [provider]  omeprazole (PRILOSEC) 20 MG capsule Take 20 mg by mouth daily. 04/06/16  Yes [provider]  saw palmetto 500 MG capsule Take 500 mg by mouth daily.   Yes [provider]  tamsulosin (FLOMAX) 0.4 MG CAPS capsule Take 1 capsule (0.4 mg total) by mouth daily. 07/30/15  Yes Nita Sells, MD    Physical Exam: Vitals:   04/08/20 1453  BP: 116/75  Pulse: (!) 128  Resp: 16  Temp: 98.6 F (37 C)  TempSrc: Oral  SpO2: 94%  Vitals:   04/08/20 1453  BP: 116/75  Pulse: (!) 128  Resp: 16  Temp: 98.6 F (37 C)  TempSrc: Oral  SpO2: 94%   General: Not in pain or dyspnea, deconditioned  Neurology: Awake and alert, non focal Head and Neck. Head normocephalic. Neck supple with no adenopathy or thyromegaly.   E ENT: mild pallor, no icterus, oral mucosa moist Cardiovascular: No JVD. S1-S2 present, rhythmic, no  gallops, rubs, or murmurs. No lower extremity edema. Pulmonary: positive breath sounds bilaterally, adequate air movement, no wheezing, rhonchi or rales. Gastrointestinal. Abdomen soft with no organomegaly, non tender, no rebound or guarding. No pelvic pain.  Skin. No rashes Musculoskeletal: no joint deformities    Labs on Admission: I have personally reviewed following labs and imaging studies  CBC: Recent Labs  Lab 04/08/20 1458  WBC 18.9*  NEUTROABS 17.2*  HGB 16.0  HCT 48.8  MCV 93.5  PLT [id number]   Basic Metabolic Panel: Recent Labs  Lab 04/08/20 1458  NA 138  K 4.2  CL 106  CO2 20*  GLUCOSE 108*  BUN 16  CREATININE 1.42*  CALCIUM 9.3   GFR: CrCl cannot be calculated (Unknown ideal weight.). Liver Function Tests: Recent Labs  Lab 04/08/20 1458  AST 21  ALT 20  ALKPHOS 82  BILITOT 1.5*  PROT 7.1  ALBUMIN 4.5   No results for input(s): LIPASE, AMYLASE in the last 168 hours. No results for input(s): AMMONIA in the last 168 hours. Coagulation Profile: Recent Labs  Lab 04/08/20 1458  INR 1.1   Cardiac Enzymes: No results for input(s): CKTOTAL, CKMB, CKMBINDEX, TROPONINI in the last 168 hours. BNP (last 3 results) No results for input(s): PROBNP in the last 8760 hours. HbA1C: No results for input(s): HGBA1C in the last 72 hours. CBG: No results for input(s): GLUCAP in the last 168 hours. Lipid Profile: No results for input(s): CHOL, HDL, LDLCALC, TRIG, CHOLHDL, LDLDIRECT in the last 72 hours. Thyroid Function Tests: No results for input(s): TSH, T4TOTAL, FREET4, T3FREE, THYROIDAB in the last 72 hours. Anemia Panel: No results for input(s): VITAMINB12, FOLATE, FERRITIN, TIBC, IRON, RETICCTPCT in the last 72 hours. Urine analysis:    Component Value Date/Time   COLORURINE YELLOW 04/08/2020 1524   APPEARANCEUR HAZY (A) 04/08/2020 1524   LABSPEC 1.016 04/08/2020 1524   PHURINE 5.0 04/08/2020 1524   GLUCOSEU NEGATIVE 04/08/2020 1524   HGBUR NEGATIVE  04/08/2020 [place] 04/08/2020 1524   KETONESUR NEGATIVE 04/08/2020 1524   PROTEINUR NEGATIVE 04/08/2020 1524   UROBILINOGEN 0.2 08/13/2015 0619   NITRITE NEGATIVE 04/08/2020 1524   LEUKOCYTESUR LARGE (A) 04/08/2020 1524    Radiological Exams on Admission: DG Chest 2 View  Result Date: 04/08/2020 CLINICAL DATA:  Urinary tract infection, sepsis, flank pain, chills, dysuria EXAM: CHEST - 2 VIEW COMPARISON:  12/21/2019 FINDINGS: Frontal and lateral views of the chest demonstrate an unremarkable cardiac silhouette. No airspace disease, effusion, or pneumothorax. No acute bony abnormalities. IMPRESSION: 1. No acute intrathoracic process. Electronically Signed   By: Randa Ngo M.D.   On: 04/08/2020 15:49    EKG: Independently reviewed.  104 bpm, normal axis, normal intervals, sinus rhythm, no significant ST segment or T wave changes.  Assessment/Plan Principal Problem:   Sepsis due to gram-negative UTI (HCC)  Active Problems:   GERD (gastroesophageal reflux disease)   Migraine   Depression   Anxiety   AKI (acute kidney injury) ([place])   Prostate cancer (HCC)   Obesity (BMI 30.0-34.9)  63 year old male with history of prostate cancer and BPH who presents with acute onset of dysuria, fevers and chills.  He has history of prior urinary tract infections, including multidrug-resistant bacteria, ESBL E. coli.  On his initial physical examination his temperature is 98.6, blood pressure 116/75, heart rate 128, respiratory rate 16-26, oxygen saturation 94% on room air.  His lungs are clear to auscultation, heart S1-S2 present and rhythmic, abdomen soft nontender, no lower extremity edema. Sodium 138, potassium 4.2, chloride 106, bicarb 20, glucose 108, BUN 16, creatinine 1.42, anion gap 12, calcium 9.3, lactic acid 2.3, white count 18.9, hemoglobin 16.0, hematocrit 48.8, platelets 223.  Urinalysis more than 50 white cells, 0-5 red cells.  Chest radiograph with bibasilar  atelectasis.  Patient will be admitted to the hospital with a working diagnosis of sepsis due to urinary tract infection, and organ damage acute kidney injury and lactic acidosis.  1.  Sepsis due to urinary tract infection, endorgan damage acute kidney injury and lactic acidosis.  Patient will be admitted to the medical ward, he will placed on a telemetry monitor, continue antibiotic therapy with IV meropenem, (his last urine culture was E. coli ESBL in February 2021) intravenous fluids with lactated Ringer's.  Follow-up on cultures, cell count and temperature curve.  Further work-up with renal ultrasound.  2. AKI.  Continue hydration with isotonic saline 75 ml per hour, follow-up kidney function in the morning, avoid hypotension nephrotoxic agents.  3.  BPH.  Continue tamsulosin.  4.  GERD.  Continue omeprazole.     Status is: Inpatient  Remains inpatient appropriate because:IV treatments appropriate due to intensity of illness or inability to take PO   Dispo: The patient is from: Home              Anticipated d/c is to: Home              Anticipated d/c date is: 2 days              Patient currently is not medically stable to d/c.    DVT prophylaxis: Enoxaparin   Code Status:    full Family Communication: no family at the bedside       Consults called:  None   Admission status:  Inpatient.    Craig Banton Gerome Apley MD [facility]   04/08/2020, 5:14 PM

## 2020-04-08 NOTE — ED Notes (Signed)
Dinner Tray Ordered @ 1850.

## 2020-04-08 NOTE — Progress Notes (Signed)
Patient c/o burning pain and unable to urinate.Per patient,tylenol didn't work. Text paged MD on call,awaiting call back.

## 2020-04-08 NOTE — Sepsis Progress Note (Cosign Needed)
Notified bedside nurse of need to administer antibiotics.  

## 2020-04-08 NOTE — ED Provider Notes (Signed)
[facility] EMERGENCY DEPARTMENT Provider Note   CSN: [id number] Arrival date & time: 04/08/20  1433     History No chief complaint on file. cc- dysuria  Craig Butler is a 64 y.o. male.  He has a history of sepsis and multidrug-resistant E. coli urinary tract infections.  He is again having urinary symptoms since this morning.  Burning with urination and chills. Moderate pain. No known fever.  No nausea vomiting diarrhea.  Has an appointment with urology coming up next month.  Thinks he gets infections due to his prostate enlargement.  Moderate amount of urinary discomfort.  No hematuria.  No cough no shortness of breath.  Has had sepsis due to this before and was very sick.  The history is provided by the patient.       Past Medical History:  Diagnosis Date  . Acid reflux   . Anxiety   . Depression   . Migraine     Patient Active Problem List   Diagnosis Date Noted  . Prostate cancer ([place]) 12/21/2019  . Obesity (BMI 30.0-34.9) 12/21/2019  . Recurrent UTI 11/09/2015  . History of ESBL E. coli infection 11/09/2015  . Acute urinary retention 07/26/2015  . Hematuria 07/26/2015  . Constipation 07/26/2015  . Sepsis ([place]) 07/24/2015  . Lower urinary tract infectious disease 07/24/2015  . AKI (acute kidney injury) ([place]) 07/24/2015  . GERD (gastroesophageal reflux disease)   . Migraine   . Depression   . Anxiety   . H/O prostate biopsy     Past Surgical History:  Procedure Laterality Date  . BIOPSY PROSTATE         Family History  Problem Relation Age of Onset  . Cancer Father   . Cancer Brother     Social History   Tobacco Use  . Smoking status: Former Smoker  Substance Use Topics  . Alcohol use: Yes  . Drug use: Not on file    Home Medications Prior to Admission medications   Medication Sig Start Date End Date Taking? Authorizing Provider  omeprazole (PRILOSEC) 20 MG capsule Take 20 mg by mouth daily. 04/06/16   [provider]  tamsulosin (FLOMAX) 0.4 MG CAPS capsule Take 1 capsule (0.4 mg total) by mouth daily. Patient not taking: Reported on 12/21/2019 07/30/15   Nita Sells, MD    Allergies    Amoxicillin  Review of Systems   Review of Systems  Constitutional: Positive for chills. Negative for fever.  HENT: Negative for sore throat.   Eyes: Negative for visual disturbance.  Respiratory: Negative for shortness of breath.   Cardiovascular: Negative for chest pain.  Gastrointestinal: Negative for abdominal pain.  Genitourinary: Positive for dysuria and frequency. Negative for hematuria.  Musculoskeletal: Negative for neck pain.  Skin: Negative for rash.  Neurological: Negative for headaches.    Physical Exam Updated Vital Signs BP 116/75 (BP Location: Right Arm)   Pulse (!) 128   Temp 98.6 F (37 C) (Oral)   Resp 16   SpO2 94%   Physical Exam Vitals and nursing note reviewed.  Constitutional:      Appearance: He is well-developed.  HENT:     Head: Normocephalic and atraumatic.  Eyes:     Conjunctiva/sclera: Conjunctivae normal.  Cardiovascular:     Rate and Rhythm: Regular rhythm. Tachycardia present.     Heart sounds: No murmur.  Pulmonary:     Effort: Pulmonary effort is normal. No respiratory distress.     Breath sounds:  Normal breath sounds.  Abdominal:     Palpations: Abdomen is soft.     Tenderness: There is no abdominal tenderness. There is no guarding or rebound.  Musculoskeletal:        General: Normal range of motion.     Cervical back: Neck supple.     Right lower leg: No edema.     Left lower leg: No edema.  Skin:    General: Skin is warm and dry.     Capillary Refill: Capillary refill takes less than 2 seconds.  Neurological:     General: No focal deficit present.     Mental Status: He is alert.     ED Results / Procedures / Treatments   Labs (all labs ordered are listed, but only abnormal results are displayed) Labs Reviewed  COMPREHENSIVE  METABOLIC PANEL - Abnormal; Notable for the following components:      Result Value   CO2 20 (*)    Glucose, Bld 108 (*)    Creatinine, Ser 1.42 (*)    Total Bilirubin 1.5 (*)    GFR calc non Af Amer 53 (*)    All other components within normal limits  LACTIC ACID, PLASMA - Abnormal; Notable for the following components:   Lactic Acid, Venous 2.3 (*)    All other components within normal limits  CBC WITH DIFFERENTIAL/PLATELET - Abnormal; Notable for the following components:   WBC 18.9 (*)    Neutro Abs 17.2 (*)    Lymphs Abs 0.4 (*)    Monocytes Absolute 1.1 (*)    Abs Immature Granulocytes 0.10 (*)    All other components within normal limits  URINALYSIS, ROUTINE W REFLEX MICROSCOPIC - Abnormal; Notable for the following components:   APPearance HAZY (*)    Leukocytes,Ua LARGE (*)    WBC, UA >50 (*)    Bacteria, UA FEW (*)    All other components within normal limits  CULTURE, BLOOD (ROUTINE X 2)  CULTURE, BLOOD (ROUTINE X 2)  URINE CULTURE  SARS CORONAVIRUS 2 BY RT PCR (HOSPITAL ORDER, [facility])  LACTIC ACID, PLASMA  PROTIME-INR  APTT  BASIC METABOLIC PANEL  CBC    EKG EKG Interpretation  Date/Time:  Thursday Apr 08 2020 16:44:18 EDT Ventricular Rate:  104 PR Interval:    QRS Duration: 106 QT Interval:  340 QTC Calculation: 448 R Axis:   31 Text Interpretation: Sinus tachycardia ST elevation, consider inferior injury No significant change since prior 2/21 Confirmed by Aletta Edouard [phone]) on 04/08/2020 4:54:26 PM   Radiology DG Chest 2 View  Result Date: 04/08/2020 CLINICAL DATA:  Urinary tract infection, sepsis, flank pain, chills, dysuria EXAM: CHEST - 2 VIEW COMPARISON:  12/21/2019 FINDINGS: Frontal and lateral views of the chest demonstrate an unremarkable cardiac silhouette. No airspace disease, effusion, or pneumothorax. No acute bony abnormalities. IMPRESSION: 1. No acute intrathoracic process. Electronically Signed   By:  Randa Ngo M.D.   On: 04/08/2020 15:49    Procedures .Critical Care Performed by: Hayden Rasmussen, MD Authorized by: Hayden Rasmussen, MD   Critical care provider statement:    Critical care time (minutes):  45   Critical care time was exclusive of:  Separately billable procedures and treating other patients   Critical care was necessary to treat or prevent imminent or life-threatening deterioration of the following conditions:  Sepsis   Critical care was time spent personally by me on the following activities:  Discussions with consultants, evaluation  of patient's response to treatment, examination of patient, ordering and performing treatments and interventions, ordering and review of laboratory studies, ordering and review of radiographic studies, pulse oximetry, re-evaluation of patient's condition, obtaining history from patient or surrogate, review of old charts and development of treatment plan with patient or surrogate   I assumed direction of critical care for this patient from another provider in my specialty: no     (including critical care time)  Medications Ordered in ED Medications  sodium chloride 0.9 % bolus 1,000 mL (0 mLs Intravenous Stopped 04/08/20 1805)    And  sodium chloride 0.9 % bolus 1,000 mL (1,000 mLs Intravenous New Bag/Given 04/08/20 1809)    And  sodium chloride 0.9 % bolus 1,000 mL (0 mLs Intravenous Stopped 04/08/20 1805)    And  sodium chloride 0.9 % bolus 500 mL (has no administration in time range)  pantoprazole (PROTONIX) EC tablet 40 mg (40 mg Oral Given 04/08/20 1737)  tamsulosin (FLOMAX) capsule 0.4 mg (0.4 mg Oral Given 04/08/20 1737)  multivitamin with minerals tablet 1 tablet (1 tablet Oral Given 04/08/20 1737)  enoxaparin (LOVENOX) injection 40 mg (40 mg Subcutaneous Given 04/08/20 1737)  acetaminophen (TYLENOL) tablet 650 mg (has no administration in time range)    Or  acetaminophen (TYLENOL) suppository 650 mg (has no administration in  time range)  ondansetron (ZOFRAN) tablet 4 mg (has no administration in time range)    Or  ondansetron (ZOFRAN) injection 4 mg (has no administration in time range)  meropenem (MERREM) 1 g in sodium chloride 0.9 % 100 mL IVPB (0 g Intravenous Stopped 04/08/20 1805)    ED Course  I have reviewed the triage vital signs and the nursing notes.  Pertinent labs & imaging results that were available during my care of the patient were reviewed by me and considered in my medical decision making (see chart for details).  Clinical Course as of Apr 08 1826  Thu Apr 08, 2020  1704 Sepsis - Repeat Assessment  Sepsis hemodynamic reassessment completed at:  5:34 PM Antibiotics infusing.  Heart rate improved.  Afebrile.  Blood pressure still soft.  Mental status normal.  Lactate improved from 2.3-1.9.     [MB]  1742 Discussed with [facility] Dr. Cathlean Sauer will evaluate the patient for admission   [MB]    Clinical Course User Index [MB] Hayden Rasmussen, MD   MDM Rules/Calculators/A&P                     This patient complains of chills and urinary symptoms; this involves an extensive number of treatment Options and is a complaint that carries with it a high risk of complications and Morbidity. The differential includes UTI, prostatitis, pyelonephritis, bacteremia, sepsis, Sirs  I ordered, reviewed and interpreted labs, which included CBC with elevated white count, chemistry with low bicarb, lactate elevated, urinalysis greater than 50 whites consistent with infection I ordered medication meropenem after curbside discussion with infectious disease due to the patient's sensitivities.  30 cc/kg sepsis fluids I ordered imaging studies which included chest x-ray and I independently    visualized and interpreted imaging which showed no gross infiltrates Previous records obtained and reviewed in epic including last micro sensitivities I consulted [facility] hospitalist Dr. Cathlean Sauer and discussed lab and  imaging findings  Critical Interventions: Recognition of Sirs/sepsis and initiation of fluids and antibiotics  After the interventions stated above, I reevaluated the patient and found patient's tachycardia improved.  Blood pressure remained soft.  Will need admission for continued IV antibiotics.  Updated patient and he is in agreement with plan.   Final Clinical Impression(s) / ED Diagnoses Final diagnoses:  Sepsis, due to unspecified organism, unspecified whether acute organ dysfunction present [facility])  Urinary tract infection without hematuria, site unspecified    Rx / DC Orders ED Discharge Orders    None       Hayden Rasmussen, MD 04/08/20 2239

## 2020-04-08 NOTE — ED Triage Notes (Signed)
Pt reports chronic UTI since 2013-sent here by PCP for sepsis workup. Pt sts that no abx work for this this same UTI that he has had for 8 years. Endorses flank pain, chills, and burning with urination.

## 2020-04-08 NOTE — Progress Notes (Signed)
New Admission Note:   Arrival Method: Arrived from ED via Stretcher Mental Orientation: Alert and oriented x4 Telemetry: Box #1 Assessment: Completed Skin: Intact IV: Lt AC Pain: Denies Tubes: N/A Safety Measures: Safety Fall Prevention Plan has been discussed.  Admission: Completed 5MW Orientation: Patient has been orientated to the room, unit and staff.  Family: Wife at bedside  Orders have been reviewed and implemented. Will continue to monitor the patient. Call light has been placed within reach and bed alarm has been activated.   Orian Amberg [employer], RN-BC Phone number: [phone]

## 2020-04-09 ENCOUNTER — Encounter (HOSPITAL_COMMUNITY): Payer: Self-pay | Admitting: Internal Medicine

## 2020-04-09 ENCOUNTER — Inpatient Hospital Stay (HOSPITAL_COMMUNITY): Payer: 59

## 2020-04-09 DIAGNOSIS — R6521 Severe sepsis with septic shock: Secondary | ICD-10-CM

## 2020-04-09 DIAGNOSIS — A4150 Gram-negative sepsis, unspecified: Secondary | ICD-10-CM

## 2020-04-09 DIAGNOSIS — N179 Acute kidney failure, unspecified: Secondary | ICD-10-CM

## 2020-04-09 DIAGNOSIS — N390 Urinary tract infection, site not specified: Secondary | ICD-10-CM

## 2020-04-09 DIAGNOSIS — E878 Other disorders of electrolyte and fluid balance, not elsewhere classified: Secondary | ICD-10-CM

## 2020-04-09 DIAGNOSIS — E872 Acidosis: Secondary | ICD-10-CM

## 2020-04-09 DIAGNOSIS — A419 Sepsis, unspecified organism: Secondary | ICD-10-CM

## 2020-04-09 DIAGNOSIS — C61 Malignant neoplasm of prostate: Secondary | ICD-10-CM

## 2020-04-09 LAB — CBC
HCT: 38.3 % — ABNORMAL LOW (ref 39.0–52.0)
Hemoglobin: 12.7 g/dL — ABNORMAL LOW (ref 13.0–17.0)
MCH: 31.2 pg (ref 26.0–34.0)
MCHC: 33.2 g/dL (ref 30.0–36.0)
MCV: 94.1 fL (ref 80.0–100.0)
Platelets: 166 10*3/uL (ref 150–400)
RBC: 4.07 MIL/uL — ABNORMAL LOW (ref 4.22–5.81)
RDW: 13.2 % (ref 11.5–15.5)
WBC: 22.4 10*3/uL — ABNORMAL HIGH (ref 4.0–10.5)
nRBC: 0 % (ref 0.0–0.2)

## 2020-04-09 LAB — GLUCOSE, CAPILLARY: Glucose-Capillary: 128 mg/dL — ABNORMAL HIGH (ref 70–99)

## 2020-04-09 LAB — BASIC METABOLIC PANEL
Anion gap: 5 (ref 5–15)
BUN: 19 mg/dL (ref 8–23)
CO2: 21 mmol/L — ABNORMAL LOW (ref 22–32)
Calcium: 8.5 mg/dL — ABNORMAL LOW (ref 8.9–10.3)
Chloride: 112 mmol/L — ABNORMAL HIGH (ref 98–111)
Creatinine, Ser: 1.34 mg/dL — ABNORMAL HIGH (ref 0.61–1.24)
GFR calc Af Amer: >60 mL/min (ref >60)
GFR calc non Af Amer: 56 mL/min — ABNORMAL LOW (ref >60)
Glucose, Bld: 125 mg/dL — ABNORMAL HIGH (ref 70–99)
Potassium: 4.3 mmol/L (ref 3.5–5.1)
Sodium: 138 mmol/L (ref 135–145)

## 2020-04-09 LAB — MRSA PCR SCREENING: MRSA by PCR: NEGATIVE

## 2020-04-09 MED ORDER — NOREPINEPHRINE 4 MG/250ML-% IV SOLN
2.0000 ug/min | INTRAVENOUS | Status: DC
  Filled 2020-04-09 (×2): qty 250

## 2020-04-09 MED ORDER — SODIUM CHLORIDE 0.9 % IV SOLN
250.0000 mL | INTRAVENOUS | Status: DC
  Administered 2020-04-09: 250 mL via INTRAVENOUS

## 2020-04-09 MED ORDER — ALBUMIN HUMAN 5 % IV SOLN
25.0000 g | Freq: Once | INTRAVENOUS | Status: AC
  Administered 2020-04-09: 25 g via INTRAVENOUS
  Filled 2020-04-09: qty 500

## 2020-04-09 MED ORDER — PHENAZOPYRIDINE HCL 100 MG PO TABS
100.0000 mg | ORAL_TABLET | Freq: Three times a day (TID) | ORAL | Status: AC
  Administered 2020-04-09 – 2020-04-10 (×6): 100 mg via ORAL
  Filled 2020-04-09 (×7): qty 1

## 2020-04-09 MED ORDER — ALBUMIN HUMAN 5 % IV SOLN: 25.0000 g | Freq: Once | INTRAVENOUS | Status: DC

## 2020-04-09 MED ORDER — SODIUM CHLORIDE 0.9 % IV BOLUS
500.0000 mL | Freq: Once | INTRAVENOUS | Status: AC
  Administered 2020-04-09: 500 mL via INTRAVENOUS

## 2020-04-09 MED ORDER — HYDROMORPHONE HCL 1 MG/ML IJ SOLN
0.5000 mg | INTRAMUSCULAR | Status: DC | PRN
  Administered 2020-04-09 – 2020-04-10 (×8): 0.5 mg via INTRAVENOUS
  Filled 2020-04-09: qty 1
  Filled 2020-04-09 (×2): qty 0.5
  Filled 2020-04-09 (×2): qty 1
  Filled 2020-04-09: qty 0.5
  Filled 2020-04-09: qty 1
  Filled 2020-04-09: qty 0.5

## 2020-04-09 MED ORDER — SODIUM CHLORIDE 0.9 % IV SOLN: INTRAVENOUS | Status: DC

## 2020-04-09 MED ORDER — SENNOSIDES-DOCUSATE SODIUM 8.6-50 MG PO TABS
1.0000 | ORAL_TABLET | Freq: Two times a day (BID) | ORAL | Status: DC
  Administered 2020-04-09 – 2020-04-10 (×3): 1 via ORAL
  Filled 2020-04-09 (×4): qty 1

## 2020-04-09 MED ORDER — CHLORHEXIDINE GLUCONATE CLOTH 2 % EX PADS
6.0000 | MEDICATED_PAD | Freq: Every day | CUTANEOUS | Status: DC
  Administered 2020-04-09 – 2020-04-13 (×5): 6 via TOPICAL

## 2020-04-09 NOTE — Progress Notes (Signed)
Patient's BP post 500cc NS bolus 77/53 HR 81,manual BP 76/48. Mansy,MD notified. Order received to transfer patient to ICU.AC in 32M and made aware of order to transfer pt to ICU.

## 2020-04-09 NOTE — Consult Note (Signed)
NAME:  Craig Butler, MRN:  [id number], DOB:  1956-08-06, LOS: 1 ADMISSION DATE:  04/08/2020, CONSULTATION DATE:  5/28 REFERRING MD:  Dr Cathlean Sauer, CHIEF COMPLAINT:  Urosepsis   Brief History   64 year old male with frequent UTI status post prostate biopsy in 2016. Admitted for UTI, but developed hypotension and was transferred to ICU by the hospitalists service. PCCM consulted.   History of present illness   64 year old male with PMH as below, which is significant for frequent urinary tract infection status post prostate biopsy in 2016. He was diagnosed with prostate cancer with plans to monitor. He has also had E. Coli urinary tract infections (including ESBL) that have been difficult to resolve despite a prolonged duration of IV antibiotics on multiple occasions. The primary issue has been deemed to be related to enlarged in decreased ability to micturate. He is planned to see urology at [facility] on 6/14 for surgical consultation regarding prostatectomy. He presented to [facility] ED on 5/27 with complaints of dysuria, chills, and rigors. UA was consistent with UTI and he was admitted to the hospitalist service. Given his history of drug resistant infection he was treated with meropenem. Then in the overnight hours on 5/27-28 he developed hypotension, which did not respond to 500cc bolus of crystalloid. He was transferred to ICU for pressors and PCCM was then consulted.   Past Medical History    has a past medical history of Acid reflux, Anxiety, Depression, and Migraine.  Significant Hospital Events     Consults:    Procedures:    Significant Diagnostic Tests:    Micro Data:  Blood 5/27 > Urine 5/27 >  Antimicrobials:  Meropenem 5/27 >  Interim history/subjective:    Objective   Blood pressure (!) 76/48, pulse 81, temperature 99.8 F (37.7 C), temperature source Oral, resp. rate 17, height 5\' 10"  (1.778 m), weight 105.8 kg, SpO2 100 %.       No intake or output data in the  24 hours ending 04/09/20 0257 Filed Weights   04/08/20 2008  Weight: 105.8 kg    Examination: General: Middle aged male in NAD HENT: [place]/AT, PERRL, no JVD Lungs: Clear Cardiovascular: RRR, no MRG Abdomen: Soft, non-tender, non-distended Extremities: No acute deformity or ROM limitation Neuro: Alert, oriented, non-focal.   Resolved Hospital Problem list     Assessment & Plan:   Severe sepsis secondary to urinary tract infection - Monitor in ICU overnight, and longer if needed - Continue IVF resuscitation - Lactic has cleared - BP has been stable since BP cuff adjusted per RN - Levophed if needed to keep MAP goal > 65 mmHg - Has not needed pressors - ABX per TRH - OK to remain on hospitalists service assuming he is able to remain off pressors.  - PRN dilaudid for bladder pain per patient request  Prostate Ca - Supportive care - I&O cath PRN - Outpatient f/u with [facility] scheduled - Tamsulosin  AKI - ensure renal function improving.  - If worsens may need to image kidneys - Consider foley  Best practice:  Diet: Per primary Pain/Anxiety/Delirium protocol (if indicated): NA VAP protocol (if indicated): NA DVT prophylaxis: Per primary GI prophylaxis: Per primary Glucose control: Per primary Mobility: BR Code Status: FULL Family Communication: Patient updated Disposition: ICU  Labs   CBC: Recent Labs  Lab 04/08/20 1458  WBC 18.9*  NEUTROABS 17.2*  HGB 16.0  HCT 48.8  MCV 93.5  PLT 223    Basic  Metabolic Panel: Recent Labs  Lab 04/08/20 1458  NA 138  K 4.2  CL 106  CO2 20*  GLUCOSE 108*  BUN 16  CREATININE 1.42*  CALCIUM 9.3   GFR: Estimated Creatinine Clearance: 65.7 mL/min (A) (by C-G formula based on SCr of 1.42 mg/dL (H)). Recent Labs  Lab 04/08/20 1458 04/08/20 1653  WBC 18.9*  --   LATICACIDVEN 2.3* 1.9    Liver Function Tests: Recent Labs  Lab 04/08/20 1458  AST 21  ALT 20  ALKPHOS 82  BILITOT 1.5*  PROT 7.1  ALBUMIN 4.5    No results for input(s): LIPASE, AMYLASE in the last 168 hours. No results for input(s): AMMONIA in the last 168 hours.  ABG    Component Value Date/Time   PHART 7.420 07/25/2015 2053   PCO2ART 28.4 (L) 07/25/2015 2053   PO2ART 57.9 (L) 07/25/2015 2053   HCO3 18.1 (L) 07/25/2015 2053   TCO2 19.0 07/25/2015 2053   ACIDBASEDEF 5.6 (H) 07/25/2015 2053   O2SAT 92.5 07/25/2015 2053     Coagulation Profile: Recent Labs  Lab 04/08/20 1458  INR 1.1    Cardiac Enzymes: No results for input(s): CKTOTAL, CKMB, CKMBINDEX, TROPONINI in the last 168 hours.  HbA1C: No results found for: HGBA1C  CBG: Recent Labs  Lab 04/09/20 0227  GLUCAP 128*    Review of Systems:   Bolds are positive  Constitutional: weight loss, gain, night sweats, Fevers, chills, fatigue .  HEENT: headaches, Sore throat, sneezing, nasal congestion, post nasal drip, Difficulty swallowing, Tooth/dental problems, visual complaints visual changes, ear ache CV:  chest pain, radiates:,Orthopnea, PND, swelling in lower extremities, dizziness, palpitations, syncope.  GI  heartburn, indigestion, abdominal pain, nausea, vomiting, diarrhea, change in bowel habits, loss of appetite, bloody stools.  Resp: cough, productive:, hemoptysis, dyspnea, chest pain, pleuritic.  Skin: rash or itching or icterus GU: dysuria, change in color of urine, urgency or frequency. flank pain, hematuria  MS: joint pain or swelling. decreased range of motion  Psych: change in mood or affect. depression or anxiety.  Neuro: difficulty with speech, weakness, numbness, ataxia    Past Medical History  He,  has a past medical history of Acid reflux, Anxiety, Depression, and Migraine.   Surgical History    Past Surgical History:  Procedure Laterality Date  . BIOPSY PROSTATE       Social History   reports that he has quit smoking. He does not have any smokeless tobacco history on file. He reports current alcohol use.   Family History   His  family history includes Cancer in his brother and father.   Allergies Allergies  Allergen Reactions  . Amoxicillin Rash     Home Medications  Prior to Admission medications   Medication Sig Start Date End Date Taking? Authorizing Provider  Ascorbic Acid (VITAMIN C) 1000 MG tablet Take 1,000 mg by mouth daily.   Yes [provider]  Cyanocobalamin (VITAMIN B 12 PO) Take 1 capsule by mouth daily.   Yes [provider]  fluticasone (FLONASE) 50 MCG/ACT nasal spray Place 1 spray into both nostrils daily as needed for allergies or rhinitis.   Yes [provider]  Multiple Vitamin (MULTIVITAMIN WITH MINERALS) TABS tablet Take 1 tablet by mouth daily.   Yes [provider]  omeprazole (PRILOSEC) 20 MG capsule Take 20 mg by mouth daily. 04/06/16  Yes [provider]  saw palmetto 500 MG capsule Take 500 mg by mouth daily.   Yes [provider]  tamsulosin (FLOMAX) 0.4 MG CAPS capsule Take 1 capsule (0.4 mg total) by mouth daily. 07/30/15  Yes Nita Sells, MD      Georgann Housekeeper, AGACNP-BC [place] for personal pager PCCM on call pager [phone]  04/09/2020 3:15 AM

## 2020-04-09 NOTE — Progress Notes (Addendum)
Critical care note:  Date of note: 04/09/2020  Subjective: The patient continued to have hypotension with BP of 77/53 and a map of 59 with a heart rate of 80 after 500 mL IV normal saline bolus, 3 L given in the emergency room in the setting of sepsis with UTI.  He complained of a headache without lightheadedness or dizziness or blurred vision.  He denied any chest pain or dyspnea or cough.  No nausea or vomiting or diarrhea.  Objective: Physical examination: Vital signs: As above, respiratory rate 17 temperature 98.9 with a pulse oximetry 100% on room air. Generally: Acutely ill elderly Caucasian male in no respiratory distress. HEENT: Atraumatic, normocephalic, PERRLA, EOMI.  OP, moist mucous membrane and tongue with no pharyngeal erythema or exudate. Neck: Supple with no JVD, carotid pulses 2+ bilaterally.  No carotid bruits. Cardiovascular: Regular rate and rhythm with normal S1-S2 with no murmurs gallops or rubs. Lungs: Clear to auscultation bilaterally. Abdomen: Soft, nontender with positive bowel sounds and no palpable organomegaly or masses. Extremities: No edema clubbing or cyanosis. Neurologic: Nonfocal. Skin: No rashes.  Labs and notes were reviewed.  Assessment/plan: 1.  Severe sepsis with septic shock secondary to UTI. -The patient will be transferred to an ICU bed. -He will be started on IV Levophed. -We will continue hydration with IV normal saline. -We will follow blood and urine cultures and lactic acid. -We will continue IV meropenem. -I discussed the case with the E link intensivist Dr. Lucile Shutters.  2.  Acute kidney injury. -We will continue hydration with IV normal saline and follow BMP avoiding nephrotoxins.  3.  We will continue other plan of care.  The plan of care was discussed in details with the patient and his questions were answered.  He agreed to proceed with the above-mentioned plan.  Further management will depend upon hospital course.  Authorized and  performed by: Eugenie Norrie, MD Total critical care time: Approximately   30    minutes. Due to a high probability of clinically significant, life-threatening deterioration, the patient required my highest level of preparedness to intervene emergently and I personally spent this critical care time directly and personally managing the patient.  This critical care time included obtaining a history, examining the patient, pulse oximetry, ordering and review of studies, arranging urgent treatment with development of management plan, evaluation of patient's response to treatment, frequent reassessment, and discussions with other providers. This critical care time was performed to assess and manage the high probability of imminent, life-threatening deterioration that could result in multiorgan failure.  It was exclusive of separately billable procedures and treating other patients and teaching time.  Please see MDM section and the rest of the note for further information on patient assessment and treatment.

## 2020-04-09 NOTE — Progress Notes (Signed)
Report given to Coralyn Mark, RN of [employer]. Patient alert and oriented and aware of transfer.

## 2020-04-09 NOTE — Progress Notes (Signed)
[place] Progress Note Patient Name: Craig Butler DOB: 10/15/1956 MRN: [id number]   Date of Service  04/09/2020  HPI/Events of Note  Burning on micturition.  eICU Interventions  Pyridium 100 mg po tid x 3 days.        [name] 04/09/2020, 2:48 AM

## 2020-04-09 NOTE — Progress Notes (Signed)
Pt arrived to unit via wheelchair.  Transferred, assessed, VS-see Epic, oriented to unit and room.  Pt reports pain 2.5/10 generalized, refuses pain medication.  In no acute distress.    Will continue to monitor.

## 2020-04-09 NOTE — Plan of Care (Signed)
  Problem: Education: Goal: Knowledge of General Education information will improve Description Including pain rating scale, medication(s)/side effects and non-pharmacologic comfort measures Outcome: Progressing   

## 2020-04-09 NOTE — Progress Notes (Signed)
[place] Progress Note Patient Name: Craig Butler DOB: May 04, 1956 MRN: [id number]   Date of Service  04/09/2020  HPI/Events of Note  Pt admitted to the floor with sepsis of urinary tract origin, then transferred to 40M due to persistent hypotension r/o evolving septic shock.  eICU Interventions  New Patient Evaluation completed. PCCM bedside crew asked to see patient in consultation.        [name] 04/09/2020, 2:23 AM

## 2020-04-09 NOTE — Progress Notes (Signed)
NAME:  Craig Butler, MRN:  [id number], DOB:  13-Dec-1955, LOS: 1 ADMISSION DATE:  04/08/2020, CONSULTATION DATE:  5/28 REFERRING MD:  Dr Cathlean Sauer, CHIEF COMPLAINT:  Urosepsis   Brief History   64 year old male with frequent UTI status post prostate biopsy in 2016. Admitted for UTI, but developed hypotension and was transferred to ICU by the hospitalists service. PCCM consulted.   History of present illness   64 year old male with PMH as below, which is significant for frequent urinary tract infection status post prostate biopsy in 2016. He was diagnosed with prostate cancer with plans to monitor. He has also had E. Coli urinary tract infections (including ESBL) that have been difficult to resolve despite a prolonged duration of IV antibiotics on multiple occasions. The primary issue has been deemed to be related to enlarged in decreased ability to micturate. He is planned to see urology at [facility] on 6/14 for surgical consultation regarding prostatectomy. He presented to [facility] ED on 5/27 with complaints of dysuria, chills, and rigors. UA was consistent with UTI and he was admitted to the hospitalist service. Given his history of drug resistant infection he was treated with meropenem. Then in the overnight hours on 5/27-28 he developed hypotension, which did not respond to 500cc bolus of crystalloid. He was transferred to ICU for pressors and PCCM was then consulted.   Past Medical History    has a past medical history of Acid reflux, Anxiety, BPH (benign prostatic hyperplasia), Depression, Frequent UTI, Migraine, and Prostate cancer ([place]).  Significant Hospital Events   Transferred to ICU 5/28  Consults:  PCCM  Procedures:    Significant Diagnostic Tests:    Micro Data:  Blood 5/27 > Urine 5/27 >  Antimicrobials:  Meropenem 5/27 >  Interim history/subjective:  Some mild back pain this morning from laying in bed, different than the kidney pain he had in his back yesterday.  Dilaudid helped with the pain with urination. Hasn't required pressors.  Objective   Blood pressure (!) 89/58, pulse 74, temperature 99.1 F (37.3 C), temperature source Oral, resp. rate 16, height 5\' 10"  (1.778 m), weight 96.6 kg, SpO2 95 %.        Intake/Output Summary (Last 24 hours) at 04/09/2020 [id number] Last data filed at 04/09/2020 0800 Gross per 24 hour  Intake 2573.19 ml  Output 1525 ml  Net 1048.19 ml   Filed Weights   04/08/20 2008 04/09/20 0300  Weight: 105.8 kg 96.6 kg    Examination:    General:  Middle aged man sitting up in bed in NAD HENT: [place]/AT, eyes anicteric Lungs: breathing comfortable on RA, CTAB Cardiovascular: RRR, no murmurs Abdomen: soft, NT, ND Extremities: No c/c/e Neuro: Awake and alert, normal speech, answering questions appropriately  Resolved Hospital Problem list     Assessment & Plan:   Severe sepsis secondary to urinary tract infection - not requiring pressors; should be stable to SD to floor - ok to con't maintenance fluids until reliably taking PO -con't meropenem; con't to follow cultures - OK to remain on hospitalists service assuming he is able to remain off pressors.  - PRN dilaudid for bladder pain   Prostate Ca - Supportive care - I&O cath PRN - Outpatient f/u with [facility] scheduled -Con't Tamsulosin, pyridium  AKI and mild NAGMA, hyperchloremia -con't to monitoring renal function -maintain euvolemia -avoid normal saline  Best practice:  Diet: Per primary Pain/Anxiety/Delirium protocol (if indicated): NA VAP protocol (if indicated): NA DVT prophylaxis: Per  primary GI prophylaxis: Per primary Glucose control: Per primary Mobility: BR Code Status: FULL Family Communication: Patient updated Disposition: ICU  Labs   CBC: Recent Labs  Lab 04/08/20 1458 04/09/20 0303  WBC 18.9* 22.4*  NEUTROABS 17.2*  --   HGB 16.0 12.7*  HCT 48.8 38.3*  MCV 93.5 94.1  PLT 223 [id number]    Basic Metabolic Panel: Recent Labs  Lab  04/08/20 1458 04/09/20 0303  NA 138 138  K 4.2 4.3  CL 106 112*  CO2 20* 21*  GLUCOSE 108* 125*  BUN 16 19  CREATININE 1.42* 1.34*  CALCIUM 9.3 8.5*   GFR: Estimated Creatinine Clearance: 66.6 mL/min (A) (by C-G formula based on SCr of 1.34 mg/dL (H)). Recent Labs  Lab 04/08/20 1458 04/08/20 1653 04/09/20 0303  WBC 18.9*  --  22.4*  LATICACIDVEN 2.3* 1.9  --     Liver Function Tests: Recent Labs  Lab 04/08/20 1458  AST 21  ALT 20  ALKPHOS 82  BILITOT 1.5*  PROT 7.1  ALBUMIN 4.5   No results for input(s): LIPASE, AMYLASE in the last 168 hours. No results for input(s): AMMONIA in the last 168 hours.  ABG    Component Value Date/Time   PHART 7.420 07/25/2015 2053   PCO2ART 28.4 (L) 07/25/2015 2053   PO2ART 57.9 (L) 07/25/2015 2053   HCO3 18.1 (L) 07/25/2015 2053   TCO2 19.0 07/25/2015 2053   ACIDBASEDEF 5.6 (H) 07/25/2015 2053   O2SAT 92.5 07/25/2015 2053     Coagulation Profile: Recent Labs  Lab 04/08/20 1458  INR 1.1    Cardiac Enzymes: No results for input(s): CKTOTAL, CKMB, CKMBINDEX, TROPONINI in the last 168 hours.  HbA1C: No results found for: HGBA1C  CBG: Recent Labs  Lab 04/09/20 [place] Nastasia Kage, DO 04/09/20 8:47 AM [facility]

## 2020-04-09 NOTE — Progress Notes (Signed)
Report given to 75M RN. Patient made aware he will be transferring to ICU. Patient did not want this RN to call wife. States "I'll call her in the morning." Patient's belongings sent with patient.

## 2020-04-10 DIAGNOSIS — I480 Paroxysmal atrial fibrillation: Secondary | ICD-10-CM

## 2020-04-10 LAB — CBC WITH DIFFERENTIAL/PLATELET
Abs Immature Granulocytes: 0.04 10*3/uL (ref 0.00–0.07)
Basophils Absolute: 0 10*3/uL (ref 0.0–0.1)
Basophils Relative: 0 %
Eosinophils Absolute: 0 10*3/uL (ref 0.0–0.5)
Eosinophils Relative: 0 %
HCT: 37.5 % — ABNORMAL LOW (ref 39.0–52.0)
Hemoglobin: 12.3 g/dL — ABNORMAL LOW (ref 13.0–17.0)
Immature Granulocytes: 1 %
Lymphocytes Relative: 10 %
Lymphs Abs: 0.8 10*3/uL (ref 0.7–4.0)
MCH: 31.1 pg (ref 26.0–34.0)
MCHC: 32.8 g/dL (ref 30.0–36.0)
MCV: 94.9 fL (ref 80.0–100.0)
Monocytes Absolute: 0.5 10*3/uL (ref 0.1–1.0)
Monocytes Relative: 6 %
Neutro Abs: 6.6 10*3/uL (ref 1.7–7.7)
Neutrophils Relative %: 83 %
Platelets: 136 10*3/uL — ABNORMAL LOW (ref 150–400)
RBC: 3.95 MIL/uL — ABNORMAL LOW (ref 4.22–5.81)
RDW: 13.3 % (ref 11.5–15.5)
WBC: 8 10*3/uL (ref 4.0–10.5)
nRBC: 0 % (ref 0.0–0.2)

## 2020-04-10 LAB — COMPREHENSIVE METABOLIC PANEL
ALT: 17 U/L (ref 0–44)
AST: 16 U/L (ref 15–41)
Albumin: 2.9 g/dL — ABNORMAL LOW (ref 3.5–5.0)
Alkaline Phosphatase: 52 U/L (ref 38–126)
Anion gap: 7 (ref 5–15)
BUN: 11 mg/dL (ref 8–23)
CO2: 21 mmol/L — ABNORMAL LOW (ref 22–32)
Calcium: 8.2 mg/dL — ABNORMAL LOW (ref 8.9–10.3)
Chloride: 113 mmol/L — ABNORMAL HIGH (ref 98–111)
Creatinine, Ser: 1.14 mg/dL (ref 0.61–1.24)
GFR calc Af Amer: >60 mL/min (ref >60)
GFR calc non Af Amer: >60 mL/min (ref >60)
Glucose, Bld: 122 mg/dL — ABNORMAL HIGH (ref 70–99)
Potassium: 3.7 mmol/L (ref 3.5–5.1)
Sodium: 141 mmol/L (ref 135–145)
Total Bilirubin: 0.8 mg/dL (ref 0.3–1.2)
Total Protein: 5.3 g/dL — ABNORMAL LOW (ref 6.5–8.1)

## 2020-04-10 LAB — TROPONIN I (HIGH SENSITIVITY)
Troponin I (High Sensitivity): 29 ng/L — ABNORMAL HIGH (ref <18)
Troponin I (High Sensitivity): 34 ng/L — ABNORMAL HIGH (ref <18)

## 2020-04-10 LAB — LACTIC ACID, PLASMA: Lactic Acid, Venous: 1.2 mmol/L (ref 0.5–1.9)

## 2020-04-10 MED ORDER — AMIODARONE HCL 200 MG PO TABS: 200.0000 mg | ORAL_TABLET | Freq: Every day | ORAL | Status: DC

## 2020-04-10 MED ORDER — DILTIAZEM HCL-DEXTROSE 125-5 MG/125ML-% IV SOLN (PREMIX)
5.0000 mg/h | INTRAVENOUS | Status: DC
  Filled 2020-04-10: qty 125

## 2020-04-10 MED ORDER — BISACODYL 5 MG PO TBEC
10.0000 mg | DELAYED_RELEASE_TABLET | Freq: Every day | ORAL | Status: AC
  Administered 2020-04-10: 10 mg via ORAL
  Filled 2020-04-10 (×2): qty 2

## 2020-04-10 MED ORDER — MAGNESIUM SULFATE 2 GM/50ML IV SOLN
2.0000 g | Freq: Once | INTRAVENOUS | Status: AC
  Administered 2020-04-10: 2 g via INTRAVENOUS
  Filled 2020-04-10: qty 50

## 2020-04-10 MED ORDER — AMIODARONE HCL 200 MG PO TABS
400.0000 mg | ORAL_TABLET | Freq: Two times a day (BID) | ORAL | Status: DC
  Administered 2020-04-10 – 2020-04-13 (×7): 400 mg via ORAL
  Filled 2020-04-10 (×7): qty 2

## 2020-04-10 MED ORDER — LACTATED RINGERS IV BOLUS
1000.0000 mL | Freq: Once | INTRAVENOUS | Status: AC
  Administered 2020-04-10: 1000 mL via INTRAVENOUS

## 2020-04-10 NOTE — Progress Notes (Signed)
Pt seen in room d/t c/o of left side chest tightness that doesn't radiate to his left arm/necl/back area.  Pt in no acute distress. Cardiologist informed of above( see new orders). Pt is closely monitored.

## 2020-04-10 NOTE — Progress Notes (Signed)
   04/10/20 0835  Assess: MEWS Score  ECG Heart Rate (!) 157  Level of Consciousness Alert  SpO2 95 %  O2 Device Room Air  Assess: MEWS Score  MEWS Temp 0  MEWS Systolic 0  MEWS Pulse 3  MEWS RR 0  MEWS LOC 0  MEWS Score 3  MEWS Score Color Yellow  Assess: if the MEWS score is Yellow or Red  Were vital signs taken at a resting state?  (informed by ccmd)  Focused Assessment Documented focused assessment  MEWS guidelines implemented *See Row Information* Yes  Treat  MEWS Interventions Escalated (See documentation below)  Take Vital Signs  Increase Vital Sign Frequency  Yellow: Q 2hr X 2 then Q 4hr X 2, if remains yellow, continue Q 4hrs  Escalate  MEWS: Escalate Yellow: discuss with charge nurse/RN and consider discussing with provider and RRT  Notify: Charge Nurse/RN  Name of Charge Nurse/RN Notified [place]  Date Charge Nurse/RN Notified 04/10/20  Time Charge Nurse/RN Notified 0840  Notify: Provider  Provider Name/Title Tawni Levy  Date Provider Notified 04/10/20  Time Provider Notified [phone]  Notification Type Page  Notification Reason Change in status  Response See new orders  Date of Provider Response 04/10/20  Time of Provider Response [phone]

## 2020-04-10 NOTE — Progress Notes (Signed)
Received a call from CCMD pt is having afib with HR 157, seen pt in room in no apparent distress, denies any chest pain/palpitations or any discomfort at this time. Attending MD has been paged, informed of above and received order for a EKG stat.

## 2020-04-10 NOTE — Significant Event (Signed)
Rapid Response Event Note  Overview: Time Called: 0928 Arrival Time: 0955 Event Type: Cardiac  Called to patient's bedside to assist with cardizem infusion.  Patient with RAF 130-140s  Initial Focused Assessment: Patient lying in the bed, he states he is "uncomfortable" but no specific pain 1L LR infusing  RAF 130s  Interventions: Obtained equipment to take full set of VS prior to starting Cardizem gtt. Patient convert to SR 70s Cardizem gtt held  BP 92/56  HR 71  RR 16 O2 sat 98% on RA BP 104/67  HR 64  RR 12  O2 sat 97% on RA  Patient states he feels better Wife at bedside during event.  Plan of Care (if not transferred): RN to call if patient has additional RAF  Event Summary: Name of Physician Notified: Dr Florencia Reasons, prior to my arrival at      at       Event End Time: [address]

## 2020-04-10 NOTE — Progress Notes (Signed)
PROGRESS NOTE    Craig Butler  [id number] DOB: 11/19/1955 DOA: 04/08/2020 PCP: Lawerance Cruel, MD    No chief complaint on file.   Brief Narrative:   PCP: Lawerance Cruel, MD   Patient coming from: Home  Chief Complaint: dysuria and fever.   HPI: Craig Butler is a 64 y.o. male with medical history significant of benign prostatic hypertrophy and prostate cancer with recurrent urinary tract infections/ prostatitis.  He also has GERD, depression and migraines. Patient was at his usual state of health until this morning when he experienced dysuria and increased urinary frequency, moderate to severe in intensity, associated with fevers and chills.  No improving or worsening factors.  He has had urinary infections before that lead to sepsis E coli ESBL).  He recognized his symptoms, and went to his primary care provider who referred him to the hospital for further evaluation.  His last hospitalization was in February, from the second to the ninth, with a working diagnosis of sepsis due to urine infection, treated with meropenem and fosfomycin.  Patient has been fully vaccinated for COVID-19.  ED Course: Patient was found tachycardic and tachypneic, positive pyuria and leukocytosis along with elevated lactic acid.  He received 3500 cc of IV isotonic saline and intravenous meropenem, then referred for further hospitalization.  Subjective:   Fever coming down, however developed tachycardia, denies chest pain , no shortness of breath, no dizziness Wife at bedside  Assessment & Plan:   Principal Problem:   Sepsis due to gram-negative UTI ([place])  Active Problems:   GERD (gastroesophageal reflux disease)   Migraine   Depression   Anxiety   AKI (acute kidney injury) ([place])   Prostate cancer (HCC)   Obesity (BMI 30.0-34.9)   Severe sepsis due to UTI -He was aggressively resuscitated with IV fluids, he received IV meropenem due to past history of ESBL in  February 2021, there is initial concern of needing vasopressor, however his blood pressure has improved with aggressive resuscitation, he is transferred out of ICU -Blood culture no growth, urine culture in process -Leukocytosis and lactic acidosis have esolved, continue hydration, continue current antibiotics, awaiting for final urine culture result  Thrombocytopenia, mild Likely due to sepsis Repeat CBC, expect improvement with treating sepsis  New onset of A. fib RVR -Reported history of palpitation, but never been diagnosed with A. fib in the past -Heart rate in the 130s to 150s, blood pressure in the 110's -Getting troponin, echocardiogram, give additional IV fluids bolus, IV magnesium -CHADS2 score 0, he does not need anticoagulation -Cardiology consulted, will follow recommendation  AKI on CKD 2, anemia of chronic disease(mild, hemoglobin around 12) Renal ultrasound no Hydronephrosis Creatinine on presentation was 1.42, improved today is 1.14 Continue hydration, repeat BMP in the morning, renal dosing meds  History of BPH, prostate cancer Renal ultrasound demonstrated"Lobulated enlargement of the prostate measuring 6.4 x 5.8 x 5.9 cm with volume of 115.5 cm. Mass effect on the bladder." He is followed at [facility] urology  DVT prophylaxis: Lovenox Code Status: Full Family Communication: Wife at bedside Disposition:   Status is: Inpatient  Dispo: The patient is from: Home              Anticipated d/c is to: Home              Anticipated d/c date is: 24 to 48 hours             Awaiting urine culture, need cardiology clearance  Consultants:   Cardiology  Critical care  Procedures:   None  Antimicrobials:    Meropenem    Objective: Vitals:   04/09/20 1957 04/09/20 2011 04/09/20 2106 04/10/20 0354  BP:  114/63 105/64 (!) 98/51  Pulse:  78 78 72  Resp:  14 14 14   Temp: 100 F (37.8 C)  100.2 F (37.9 C) 99 F (37.2 C)  TempSrc: Oral  Oral Oral  SpO2:  97%  97% 96%  Weight:      Height:        Intake/Output Summary (Last 24 hours) at 04/10/2020 0716 Last data filed at 04/10/2020 [id number] Gross per 24 hour  Intake 3243.01 ml  Output 1075 ml  Net 2168.01 ml   Filed Weights   04/08/20 2008 04/09/20 0300  Weight: 105.8 kg 96.6 kg    Examination:  General exam: calm, NAD Respiratory system: Clear to auscultation. Respiratory effort normal. Cardiovascular system: IRRR Gastrointestinal system: Abdomen is nondistended, soft and nontender. No organomegaly or masses felt. Normal bowel sounds heard. Central nervous system: Alert and oriented. No focal neurological deficits. Extremities: Symmetric 5 x 5 power. Skin: No rashes, lesions or ulcers Psychiatry: Judgement and insight appear normal. Mood & affect appropriate.     Data Reviewed: I have personally reviewed following labs and imaging studies  CBC: Recent Labs  Lab 04/08/20 1458 04/09/20 0303  WBC 18.9* 22.4*  NEUTROABS 17.2*  --   HGB 16.0 12.7*  HCT 48.8 38.3*  MCV 93.5 94.1  PLT 223 [id number]    Basic Metabolic Panel: Recent Labs  Lab 04/08/20 1458 04/09/20 0303  NA 138 138  K 4.2 4.3  CL 106 112*  CO2 20* 21*  GLUCOSE 108* 125*  BUN 16 19  CREATININE 1.42* 1.34*  CALCIUM 9.3 8.5*    GFR: Estimated Creatinine Clearance: 66.6 mL/min (A) (by C-G formula based on SCr of 1.34 mg/dL (H)).  Liver Function Tests: Recent Labs  Lab 04/08/20 1458  AST 21  ALT 20  ALKPHOS 82  BILITOT 1.5*  PROT 7.1  ALBUMIN 4.5    CBG: Recent Labs  Lab 04/09/20 0227  GLUCAP 128*     Recent Results (from the past 240 hour(s))  Culture, blood (Routine x 2)     Status: None (Preliminary result)   Collection Time: 04/08/20  2:58 PM   Specimen: BLOOD  Result Value Ref Range Status   Specimen Description BLOOD LEFT ANTECUBITAL  Final   Special Requests   Final    BOTTLES DRAWN AEROBIC ONLY Blood Culture adequate volume   Culture   Final    NO GROWTH 2 DAYS Performed at [facility] [address], [place], [place] [address]    Report Status PENDING  Incomplete  SARS Coronavirus 2 by RT PCR (hospital order, performed in [facility] hospital lab) Nasopharyngeal Nasopharyngeal Swab     Status: None   Collection Time: 04/08/20  4:53 PM   Specimen: Nasopharyngeal Swab  Result Value Ref Range Status   SARS Coronavirus 2 NEGATIVE NEGATIVE Final    Comment: (NOTE) SARS-CoV-2 target nucleic acids are NOT DETECTED. The SARS-CoV-2 RNA is generally detectable in upper and lower respiratory specimens during the acute phase of infection. The lowest concentration of SARS-CoV-2 viral copies this assay can detect is 250 copies / mL. A negative result does not preclude SARS-CoV-2 infection and should not be used as the sole basis for treatment or other patient management decisions.  A negative result may occur  with improper specimen collection / handling, submission of specimen other than nasopharyngeal swab, presence of viral mutation(s) within the areas targeted by this assay, and inadequate number of viral copies (<250 copies / mL). A negative result must be combined with clinical observations, patient history, and epidemiological information. Fact Sheet for Patients:   StrictlyIdeas.no Fact Sheet for Healthcare Providers: BankingDealers.co.za This test is not yet approved or cleared  by the [place] FDA and has been authorized for detection and/or diagnosis of SARS-CoV-2 by FDA under an Emergency Use Authorization (EUA).  This EUA will remain in effect (meaning this test can be used) for the duration of the COVID-19 declaration under Section 564(b)(1) of the Act, 21 U.S.C. section 360bbb-3(b)(1), unless the authorization is terminated or revoked sooner. Performed at [facility], [place] [address], [place], [place] [address]   Culture, blood (Routine x 2)     Status: None (Preliminary result)   Collection  Time: 04/08/20  5:00 PM   Specimen: BLOOD  Result Value Ref Range Status   Specimen Description BLOOD LEFT ANTECUBITAL  Final   Special Requests   Final    BOTTLES DRAWN AEROBIC AND ANAEROBIC Blood Culture results may not be optimal due to an inadequate volume of blood received in culture bottles   Culture   Final    NO GROWTH 2 DAYS Performed at [facility], [place] [address], [place], [place] [address]    Report Status PENDING  Incomplete  MRSA PCR Screening     Status: None   Collection Time: 04/09/20  3:34 AM   Specimen: Nasopharyngeal  Result Value Ref Range Status   MRSA by PCR NEGATIVE NEGATIVE Final    Comment:        The GeneXpert MRSA Assay (FDA approved for NASAL specimens only), is one component of a comprehensive MRSA colonization surveillance program. It is not intended to diagnose MRSA infection nor to guide or monitor treatment for MRSA infections. Performed at [facility], [place] [address] [place], [place] [address]          Radiology Studies: DG Chest 2 View  Result Date: 04/08/2020 CLINICAL DATA:  Urinary tract infection, sepsis, flank pain, chills, dysuria EXAM: CHEST - 2 VIEW COMPARISON:  12/21/2019 FINDINGS: Frontal and lateral views of the chest demonstrate an unremarkable cardiac silhouette. No airspace disease, effusion, or pneumothorax. No acute bony abnormalities. IMPRESSION: 1. No acute intrathoracic process. Electronically Signed   By: Randa Ngo M.D.   On: 04/08/2020 15:49   US RENAL  Result Date: 04/09/2020 CLINICAL DATA:  Elevated creatinine EXAM: RENAL / URINARY TRACT ULTRASOUND COMPLETE COMPARISON:  CT 12/21/2019 FINDINGS: Right Kidney: Renal measurements: 11.3 x 5.3 x 6.8 cm = volume: 212.4 mL . Echogenicity within normal limits. No mass or hydronephrosis visualized. Left Kidney: Renal measurements: 11.2 x 6.2 x 6.8 cm = volume: 247.3 mL. Echogenicity within normal limits. No mass or hydronephrosis visualized. Bladder: Appears  normal for degree of bladder distention. Other: Lobulated enlargement of the prostate measuring 6.4 x 5.8 x 5.9 cm with volume of 115.5 cm. Mass effect on the bladder. IMPRESSION: 1. Negative ultrasound appearance of the kidneys. 2. Irregular enlargement of the prostate with mass effect on the bladder. Electronically Signed   By: Donavan Foil M.D.   On: 04/09/2020 17:35        Scheduled Meds: . Chlorhexidine Gluconate Cloth  6 each Topical Daily  . enoxaparin (LOVENOX) injection  40 mg Subcutaneous Q24H  .  multivitamin with minerals  1 tablet Oral Daily  . pantoprazole  40 mg Oral Daily  . phenazopyridine  100 mg Oral TID WC  . senna-docusate  1 tablet Oral BID  . tamsulosin  0.4 mg Oral Daily   Continuous Infusions: . sodium chloride 100 mL/hr at 04/10/20 0635  . sodium chloride 10 mL/hr at 04/09/20 2000  . meropenem (MERREM) IV 1 g (04/10/20 0630)  . norepinephrine (LEVOPHED) Adult infusion    . sodium chloride       LOS: 2 days     Time spent: 42mins I have personally reviewed and interpreted on  04/10/2020 daily labs, tele strips, imagings as discussed above under date review session and assessment and plans.  I reviewed all nursing notes, pharmacy notes, consultant notes,  vitals, pertinent old records  I have discussed plan of care as described above with RN , patient and family on 04/10/2020  Voice Recognition /Dragon dictation system was used to create this note, attempts have been made to correct errors. Please contact the author with questions and/or clarifications.   Florencia Reasons, MD PhD FACP [facility]  Available via Epic secure chat 7am-7pm for nonurgent issues Please page for urgent issues To page the attending provider between 7A-7P or the covering provider during after hours 7P-7A, please log into the web site [email or web address] and access using universal [place] password for that web site. If you do not have the password, please call the hospital  operator.    04/10/2020, 7:16 AM

## 2020-04-10 NOTE — Plan of Care (Signed)

## 2020-04-10 NOTE — Progress Notes (Signed)
It was reported to me that pt's temp. Trending to 100.6, denies any discomfort/pain at this time. Pt remains alert/oriented in no apparent distress. Currently running in NSR. No complaints. Attending MD has been notified with no new orders at this time. Pt is closely monitored.

## 2020-04-10 NOTE — Progress Notes (Signed)
Pt remains alert/oriented in no apparent distress with spouse at bedside. denies any chest pain/palpitatiionsSOB or any discomfort at this time No complaints..  Cardizem gtt/ Transfer to progressive unit put on hold as pt's HR converted to NSR, RRT [place] aware/ attemnding MD made aware of above. Pt is closely monitored.

## 2020-04-10 NOTE — Consult Note (Signed)
Cardiology Consultation:  Patient ID: Craig Butler MRN: [id number]; DOB: 1955/12/19  Admit date: 04/08/2020 Date of Consult: 04/10/2020  Primary Care Provider: Lawerance Cruel, MD Primary Cardiologist: No primary care provider on file.   Patient Profile:  Craig Butler is a 64 y.o. male with a hx of prostate cancer, recurrent UTI who is being seen today for the evaluation of new onset atrial fibrillation at the request of Florencia Reasons, MD.  History of Present Illness:  [name] was admitted on 04/08/2020 with dysuria.  He has frequent and recurrent UTIs due to his known prostate cancer.  Apparently he has had several prostate biopsies.  He reports that he is susceptible to frequent UTIs.  He was admitted for IV antibiotic therapy.  Apparently overnight on 04/09/2020 he developed hypotension.  This was consistent with severe sepsis/septic shock.  He has been aggressively volume resuscitated.  He did not require inotropic agents but there was a concern he may.  He also suffered from an acute kidney injury.  Early this morning around 2:30 AM on 04/02/2020 he developed atrial fibrillation with RVR.  His heart rates were in the 130 range.  Shortly after 6 AM without any intervention he spontaneously converted back to normal sinus rhythm.  He reports he feels overall poor and fatigue but denies any chest pain or shortness of breath during my exam.  He did report to nursing staff that he was having some tightness in his chest shortly after my visit.  Labs show creatinine is trending down to 1.1.  His white counts are trending down from 22-8.  He is not anemic.  Heart Pathway Score:       Past Medical History: Past Medical History:  Diagnosis Date  . Acid reflux   . Anxiety   . BPH (benign prostatic hyperplasia)   . Depression   . Frequent UTI   . Migraine   . Prostate cancer [facility])     Past Surgical History: Past Surgical History:  Procedure Laterality Date  . BIOPSY PROSTATE        Home Medications:  Prior to Admission medications   Medication Sig Start Date End Date Taking? Authorizing Provider  Ascorbic Acid (VITAMIN C) 1000 MG tablet Take 1,000 mg by mouth daily.   Yes [provider]  Cyanocobalamin (VITAMIN B 12 PO) Take 1 capsule by mouth daily.   Yes [provider]  fluticasone (FLONASE) 50 MCG/ACT nasal spray Place 1 spray into both nostrils daily as needed for allergies or rhinitis.   Yes [provider]  Multiple Vitamin (MULTIVITAMIN WITH MINERALS) TABS tablet Take 1 tablet by mouth daily.   Yes [provider]  omeprazole (PRILOSEC) 20 MG capsule Take 20 mg by mouth daily. 04/06/16  Yes [provider]  saw palmetto 500 MG capsule Take 500 mg by mouth daily.   Yes [provider]  tamsulosin (FLOMAX) 0.4 MG CAPS capsule Take 1 capsule (0.4 mg total) by mouth daily. 07/30/15  Yes Nita Sells, MD    Inpatient Medications: Scheduled Meds: . bisacodyl  10 mg Oral Daily  . Chlorhexidine Gluconate Cloth  6 each Topical Daily  . enoxaparin (LOVENOX) injection  40 mg Subcutaneous Q24H  . multivitamin with minerals  1 tablet Oral Daily  . pantoprazole  40 mg Oral Daily  . phenazopyridine  100 mg Oral TID WC  . senna-docusate  1 tablet Oral BID  . tamsulosin  0.4 mg Oral Daily   Continuous Infusions: .  sodium chloride 100 mL/hr at 04/10/20 0635  . sodium chloride 10 mL/hr at 04/09/20 2000  . magnesium sulfate bolus IVPB    . meropenem (MERREM) IV 1 g (04/10/20 0630)  . sodium chloride     PRN Meds: acetaminophen **OR** acetaminophen, flavoxATE, HYDROmorphone (DILAUDID) injection, ondansetron **OR** ondansetron (ZOFRAN) IV  Allergies:    Allergies  Allergen Reactions  . Amoxicillin Rash    Social History:   Social History   Socioeconomic History  . Marital status: Married    Spouse name: Not on file  . Number of children: Not on file  . Years of education: Not on file  . Highest  education level: Not on file  Occupational History  . Not on file  Tobacco Use  . Smoking status: Former Research scientist (life sciences)  . Smokeless tobacco: Never Used  Substance and Sexual Activity  . Alcohol use: Not Currently  . Drug use: Never  . Sexual activity: Not on file  Other Topics Concern  . Not on file  Social History Narrative  . Not on file   Social Determinants of Health   Financial Resource Strain:   . Difficulty of Paying Living Expenses:   Food Insecurity:   . Worried About Charity fundraiser in the Last Year:   . Arboriculturist in the Last Year:   Transportation Needs:   . Film/video editor (Medical):   Marland Kitchen Lack of Transportation (Non-Medical):   Physical Activity:   . Days of Exercise per Week:   . Minutes of Exercise per Session:   Stress:   . Feeling of Stress :   Social Connections:   . Frequency of Communication with Friends and Family:   . Frequency of Social Gatherings with Friends and Family:   . Attends Religious Services:   . Active Member of Clubs or Organizations:   . Attends Archivist Meetings:   Marland Kitchen Marital Status:   Intimate Partner Violence:   . Fear of Current or Ex-Partner:   . Emotionally Abused:   Marland Kitchen Physically Abused:   . Sexually Abused:      Family History:    Family History  Problem Relation Age of Onset  . Cancer Father   . Cancer Brother      ROS:  All other ROS reviewed and negative. Pertinent positives noted in the HPI.     Physical Exam/Data:   Vitals:   04/10/20 0844 04/10/20 0847 04/10/20 1026 04/10/20 1048  BP: 107/71  (!) 92/56 104/67  Pulse: 95  71 64  Resp: 16  16 12   Temp:  98.2 F (36.8 C) 98.3 F (36.8 C)   TempSrc:  Oral Oral   SpO2: 97%  98% 97%  Weight:      Height:         Intake/Output Summary (Last 24 hours) at 04/10/2020 1152 Last data filed at 04/10/2020 1100 Gross per 24 hour  Intake 2548.22 ml  Output 2075 ml  Net 473.22 ml    Last 3 Weights 04/09/2020 04/08/2020 12/21/2019  Weight (lbs)  212 lb 15.4 oz 233 lb 4.8 oz 230 lb  Weight (kg) 96.6 kg 105.824 kg 104.327 kg    Body mass index is 30.56 kg/m.  General: Well nourished, well developed, in no acute distress Head: Atraumatic, normal size  Eyes: PEERLA, EOMI  Neck: Supple, no JVD Endocrine: No thryomegaly Cardiac: Normal S1, S2; RRR; no murmurs, rubs, or gallops Lungs: Clear to auscultation bilaterally, no wheezing, rhonchi or rales  Abd: Soft, nontender, no hepatomegaly  Ext: No edema, pulses 2+ Musculoskeletal: No deformities, BUE and BLE strength normal and equal Skin: Warm and dry, no rashes   Neuro: Alert and oriented to person, place, time, and situation, CNII-XII grossly intact, no focal deficits  Psych: Normal mood and affect   EKG:  The EKG was personally reviewed and demonstrates: Atrial fibrillation with RVR, heart rate 130, no acute ST-T changes, no evidence of prior infarction Telemetry:  Telemetry was personally reviewed and demonstrates:  Atrial fibrillation overnight.  Is currently normal sinus rhythm heart rate in 70s  Relevant CV Studies: Echocardiogram pending  Laboratory Data: High Sensitivity Troponin:  No results for input(s): TROPONINIHS in the last 720 hours.   Cardiac EnzymesNo results for input(s): TROPONINI in the last 168 hours. No results for input(s): TROPIPOC in the last 168 hours.  Chemistry Recent Labs  Lab 04/08/20 1458 04/09/20 0303 04/10/20 1020  NA 138 138 141  K 4.2 4.3 3.7  CL 106 112* 113*  CO2 20* 21* 21*  GLUCOSE 108* 125* 122*  BUN 16 19 11   CREATININE 1.42* 1.34* 1.14  CALCIUM 9.3 8.5* 8.2*  GFRNONAA 53* 56* >60  GFRAA >60 >60 >60  ANIONGAP 12 5 7     Recent Labs  Lab 04/08/20 1458 04/10/20 1020  PROT 7.1 5.3*  ALBUMIN 4.5 2.9*  AST 21 16  ALT 20 17  ALKPHOS 82 52  BILITOT 1.5* 0.8   Hematology Recent Labs  Lab 04/08/20 1458 04/09/20 0303 04/10/20 1020  WBC 18.9* 22.4* 8.0  RBC 5.22 4.07* 3.95*  HGB 16.0 12.7* 12.3*  HCT 48.8 38.3* 37.5*    MCV 93.5 94.1 94.9  MCH 30.7 31.2 31.1  MCHC 32.8 33.2 32.8  RDW 12.9 13.2 13.3  PLT 223 166 136*   BNPNo results for input(s): BNP, PROBNP in the last 168 hours.  DDimer No results for input(s): DDIMER in the last 168 hours.  Radiology/Studies:  DG Chest 2 View  Result Date: 04/08/2020 CLINICAL DATA:  Urinary tract infection, sepsis, flank pain, chills, dysuria EXAM: CHEST - 2 VIEW COMPARISON:  12/21/2019 FINDINGS: Frontal and lateral views of the chest demonstrate an unremarkable cardiac silhouette. No airspace disease, effusion, or pneumothorax. No acute bony abnormalities. IMPRESSION: 1. No acute intrathoracic process. Electronically Signed   By: Randa Ngo M.D.   On: 04/08/2020 15:49   US RENAL  Result Date: 04/09/2020 CLINICAL DATA:  Elevated creatinine EXAM: RENAL / URINARY TRACT ULTRASOUND COMPLETE COMPARISON:  CT 12/21/2019 FINDINGS: Right Kidney: Renal measurements: 11.3 x 5.3 x 6.8 cm = volume: 212.4 mL . Echogenicity within normal limits. No mass or hydronephrosis visualized. Left Kidney: Renal measurements: 11.2 x 6.2 x 6.8 cm = volume: 247.3 mL. Echogenicity within normal limits. No mass or hydronephrosis visualized. Bladder: Appears normal for degree of bladder distention. Other: Lobulated enlargement of the prostate measuring 6.4 x 5.8 x 5.9 cm with volume of 115.5 cm. Mass effect on the bladder. IMPRESSION: 1. Negative ultrasound appearance of the kidneys. 2. Irregular enlargement of the prostate with mass effect on the bladder. Electronically Signed   By: Donavan Foil M.D.   On: 04/09/2020 17:35    Assessment and Plan:  1. New onset paroxysmal atrial fibrillation:  -This is secondary to severe sepsis and septic shock.  His chads vasc is 0.  He is already spontaneously converted back to normal sinus rhythm.  This requires no intervention at this time.  I have discontinued the diltiazem drip.  He  has already converted back to normal rhythm.  He has soft blood pressures and  is being treated for sepsis. -We will go ahead and start him on a short 1 month course of amiodarone.  We will start with amiodarone 400 mg twice daily for 7 days and then pursue 200 mg daily for 21 days.  This will help him maintain sinus rhythm while he has been treated for sepsis. -Given his chads vasc score of 0 he does not require anticoagulation. -We will follow-up the results of his TSH, echocardiogram.  I also ordered high-sensitivity troponins.  We will ensure no other cardiac abnormalities going on here.  I have a low suspicion for any sinister cardiac pathology.  Would recommend to continue aggressive treatment for sepsis and we will follow-up tomorrow.  For questions or updates, please contact [facility] Please consult [email or web address] for contact info under   Signed, [place] T. Audie Box, [place]  04/10/2020 11:52 AM

## 2020-04-11 ENCOUNTER — Inpatient Hospital Stay (HOSPITAL_COMMUNITY): Payer: 59

## 2020-04-11 DIAGNOSIS — I4891 Unspecified atrial fibrillation: Secondary | ICD-10-CM

## 2020-04-11 LAB — CBC WITH DIFFERENTIAL/PLATELET
Abs Immature Granulocytes: 0.03 10*3/uL (ref 0.00–0.07)
Basophils Absolute: 0 10*3/uL (ref 0.0–0.1)
Basophils Relative: 1 %
Eosinophils Absolute: 0 10*3/uL (ref 0.0–0.5)
Eosinophils Relative: 1 %
HCT: 39.7 % (ref 39.0–52.0)
Hemoglobin: 12.8 g/dL — ABNORMAL LOW (ref 13.0–17.0)
Immature Granulocytes: 1 %
Lymphocytes Relative: 13 %
Lymphs Abs: 0.6 10*3/uL — ABNORMAL LOW (ref 0.7–4.0)
MCH: 30.8 pg (ref 26.0–34.0)
MCHC: 32.2 g/dL (ref 30.0–36.0)
MCV: 95.4 fL (ref 80.0–100.0)
Monocytes Absolute: 0.4 10*3/uL (ref 0.1–1.0)
Monocytes Relative: 9 %
Neutro Abs: 3.7 10*3/uL (ref 1.7–7.7)
Neutrophils Relative %: 75 %
Platelets: 129 10*3/uL — ABNORMAL LOW (ref 150–400)
RBC: 4.16 MIL/uL — ABNORMAL LOW (ref 4.22–5.81)
RDW: 13.4 % (ref 11.5–15.5)
WBC: 4.8 10*3/uL (ref 4.0–10.5)
nRBC: 0 % (ref 0.0–0.2)

## 2020-04-11 LAB — MAGNESIUM: Magnesium: 1.9 mg/dL (ref 1.7–2.4)

## 2020-04-11 LAB — BASIC METABOLIC PANEL
Anion gap: 5 (ref 5–15)
BUN: 9 mg/dL (ref 8–23)
CO2: 23 mmol/L (ref 22–32)
Calcium: 8.1 mg/dL — ABNORMAL LOW (ref 8.9–10.3)
Chloride: 110 mmol/L (ref 98–111)
Creatinine, Ser: 1.05 mg/dL (ref 0.61–1.24)
GFR calc Af Amer: >60 mL/min (ref >60)
GFR calc non Af Amer: >60 mL/min (ref >60)
Glucose, Bld: 115 mg/dL — ABNORMAL HIGH (ref 70–99)
Potassium: 3.9 mmol/L (ref 3.5–5.1)
Sodium: 138 mmol/L (ref 135–145)

## 2020-04-11 LAB — ECHOCARDIOGRAM COMPLETE
Height: 70 in
Weight: 3407.43 oz

## 2020-04-11 LAB — TSH: TSH: 2.39 u[IU]/mL (ref 0.350–4.500)

## 2020-04-11 MED ORDER — SODIUM CHLORIDE 0.9 % IV SOLN: INTRAVENOUS | Status: AC

## 2020-04-11 NOTE — Progress Notes (Signed)
Pt alert/oriented in no apparent distress. Ambulated to the unit's hallway one full lap. Steady gait with no complaints.

## 2020-04-11 NOTE — Plan of Care (Signed)

## 2020-04-11 NOTE — Progress Notes (Signed)
Cardiology Progress Note  Patient ID: Craig Butler MRN: [id number] DOB: 04/25/1956 Date of Encounter: 04/11/2020  Primary Cardiologist: No primary care provider on file.  Subjective   Chief Complaint: Afib  HPI: Feels better. No further Afib.   ROS:  All other ROS reviewed and negative. Pertinent positives noted in the HPI.     Inpatient Medications  Scheduled Meds: . amiodarone  400 mg Oral BID   Followed by  . [START ON 04/17/2020] amiodarone  200 mg Oral Daily  . bisacodyl  10 mg Oral Daily  . Chlorhexidine Gluconate Cloth  6 each Topical Daily  . enoxaparin (LOVENOX) injection  40 mg Subcutaneous Q24H  . multivitamin with minerals  1 tablet Oral Daily  . pantoprazole  40 mg Oral Daily  . senna-docusate  1 tablet Oral BID  . tamsulosin  0.4 mg Oral Daily   Continuous Infusions: . sodium chloride Stopped (04/11/20 0540)  . sodium chloride 20 mL/hr at 04/10/20 1650  . meropenem (MERREM) IV 1 g (04/11/20 0540)  . sodium chloride     PRN Meds: acetaminophen **OR** acetaminophen, flavoxATE, HYDROmorphone (DILAUDID) injection, ondansetron **OR** ondansetron (ZOFRAN) IV   Vital Signs   Vitals:   04/10/20 1745 04/10/20 2056 04/11/20 0025 04/11/20 0534  BP: 128/75 (!) 108/55 (!) 111/58 136/79  Pulse: 83 85 88 76  Resp: 18 16 16 16   Temp: (!) 100.6 F (38.1 C) 100 F (37.8 C) 100 F (37.8 C) 100.1 F (37.8 C)  TempSrc: Oral Oral Oral Oral  SpO2: 96% 94% 96% 96%  Weight:      Height:        Intake/Output Summary (Last 24 hours) at 04/11/2020 0816 Last data filed at 04/11/2020 0600 Gross per 24 hour  Intake 3634.27 ml  Output 3000 ml  Net 634.27 ml   Last 3 Weights 04/09/2020 04/08/2020 12/21/2019  Weight (lbs) 212 lb 15.4 oz 233 lb 4.8 oz 230 lb  Weight (kg) 96.6 kg 105.824 kg 104.327 kg      Telemetry  Overnight telemetry shows NSR 70s, which I personally reviewed.   ECG  The most recent ECG shows NSR, which I personally reviewed.   Physical Exam    Vitals:   04/10/20 1745 04/10/20 2056 04/11/20 0025 04/11/20 0534  BP: 128/75 (!) 108/55 (!) 111/58 136/79  Pulse: 83 85 88 76  Resp: 18 16 16 16   Temp: (!) 100.6 F (38.1 C) 100 F (37.8 C) 100 F (37.8 C) 100.1 F (37.8 C)  TempSrc: Oral Oral Oral Oral  SpO2: 96% 94% 96% 96%  Weight:      Height:         Intake/Output Summary (Last 24 hours) at 04/11/2020 0816 Last data filed at 04/11/2020 0600 Gross per 24 hour  Intake 3634.27 ml  Output 3000 ml  Net 634.27 ml    Last 3 Weights 04/09/2020 04/08/2020 12/21/2019  Weight (lbs) 212 lb 15.4 oz 233 lb 4.8 oz 230 lb  Weight (kg) 96.6 kg 105.824 kg 104.327 kg    Body mass index is 30.56 kg/m.  General: Well nourished, well developed, in no acute distress Head: Atraumatic, normal size  Eyes: PEERLA, EOMI  Neck: Supple, no JVD Endocrine: No thryomegaly Cardiac: Normal S1, S2; RRR; no murmurs, rubs, or gallops Lungs: Clear to auscultation bilaterally, no wheezing, rhonchi or rales  Abd: Soft, nontender, no hepatomegaly  Ext: No edema, pulses 2+ Musculoskeletal: No deformities, BUE and BLE strength normal and equal Skin: Warm and dry, no  rashes   Neuro: Alert and oriented to person, place, time, and situation, CNII-XII grossly intact, no focal deficits  Psych: Normal mood and affect   Labs  High Sensitivity Troponin:   Recent Labs  Lab 04/10/20 1211 04/10/20 1503  TROPONINIHS 29* 34*     Cardiac EnzymesNo results for input(s): TROPONINI in the last 168 hours. No results for input(s): TROPIPOC in the last 168 hours.  Chemistry Recent Labs  Lab 04/08/20 1458 04/08/20 1458 04/09/20 0303 04/10/20 1020 04/11/20 0338  NA 138   < > 138 141 138  K 4.2   < > 4.3 3.7 3.9  CL 106   < > 112* 113* 110  CO2 20*   < > 21* 21* 23  GLUCOSE 108*   < > 125* 122* 115*  BUN 16   < > 19 11 9   CREATININE 1.42*   < > 1.34* 1.14 1.05  CALCIUM 9.3   < > 8.5* 8.2* 8.1*  PROT 7.1  --   --  5.3*  --   ALBUMIN 4.5  --   --  2.9*  --   AST  21  --   --  16  --   ALT 20  --   --  17  --   ALKPHOS 82  --   --  52  --   BILITOT 1.5*  --   --  0.8  --   GFRNONAA 53*   < > 56* >60 >60  GFRAA >60   < > >60 >60 >60  ANIONGAP 12   < > 5 7 5    < > = values in this interval not displayed.    Hematology Recent Labs  Lab 04/09/20 0303 04/10/20 1020 04/11/20 0338  WBC 22.4* 8.0 4.8  RBC 4.07* 3.95* 4.16*  HGB 12.7* 12.3* 12.8*  HCT 38.3* 37.5* 39.7  MCV 94.1 94.9 95.4  MCH 31.2 31.1 30.8  MCHC 33.2 32.8 32.2  RDW 13.2 13.3 13.4  PLT 166 136* 129*   BNPNo results for input(s): BNP, PROBNP in the last 168 hours.  DDimer No results for input(s): DDIMER in the last 168 hours.   Radiology  US RENAL  Result Date: 04/09/2020 CLINICAL DATA:  Elevated creatinine EXAM: RENAL / URINARY TRACT ULTRASOUND COMPLETE COMPARISON:  CT 12/21/2019 FINDINGS: Right Kidney: Renal measurements: 11.3 x 5.3 x 6.8 cm = volume: 212.4 mL . Echogenicity within normal limits. No mass or hydronephrosis visualized. Left Kidney: Renal measurements: 11.2 x 6.2 x 6.8 cm = volume: 247.3 mL. Echogenicity within normal limits. No mass or hydronephrosis visualized. Bladder: Appears normal for degree of bladder distention. Other: Lobulated enlargement of the prostate measuring 6.4 x 5.8 x 5.9 cm with volume of 115.5 cm. Mass effect on the bladder. IMPRESSION: 1. Negative ultrasound appearance of the kidneys. 2. Irregular enlargement of the prostate with mass effect on the bladder. Electronically Signed   By: Donavan Foil M.D.   On: 04/09/2020 17:35    Cardiac Studies  Echo pending   Patient Profile  Craig Butler is a 64 y.o. male with prostate CA and recurrent UTI admitted 04/08/2020 for sepsis 2/2 UTI. Developed afib with RVR 5/28-5/29 with spontaneous conversion to NSR.   Assessment & Plan   1. New onset Afib with RVR - 2/2 sepsis - converted back to NSR spontaneously - TSH normal - troponin negative for ACS and EKG normal without ischemic changes - echo  pending (we will follow-up by chart and get  re-involved if abnormal) - CHADSVASC = 0 so no AC - plan for short course of amiodarone (400 mg BID x 7 days and then 200 mg daily for 21 days and stop) - I discussed follow-up with [place] and he reports he lives closer to [place] and will find a Cardiologist there. I did inform him that amiodarone is a short course (1 month) and not to be long term   [facility] will sign off.   Medication Recommendations:  No AC, amiodarone as above Other recommendations (labs, testing, etc):  Echo pending (we will follow-up remotely) Follow up as an outpatient:  I offered him follow-up in 1 month here. He reports he will find a cardiologist in [place]. I gave him my card, should he like to come to our office   For questions or updates, please contact [place] Please consult [email or web address] for contact info under   Time Spent with Patient: I have spent a total of 25 minutes with patient reviewing hospital notes, telemetry, EKGs, labs and examining the patient as well as establishing an assessment and plan that was discussed with the patient.  > 50% of time was spent in direct patient care.    Signed, Addison Naegeli. Audie Box, [place]  04/11/2020 8:16 AM

## 2020-04-11 NOTE — Progress Notes (Addendum)
PROGRESS NOTE    Craig Butler  [id number] DOB: 05-Nov-1956 DOA: 04/08/2020 PCP: Lawerance Cruel, MD    No chief complaint on file.   Brief Narrative:   PCP: Lawerance Cruel, MD   Patient coming from: Home  Chief Complaint: dysuria and fever.   HPI: Craig Butler is a 64 y.o. male with medical history significant of benign prostatic hypertrophy and prostate cancer with recurrent urinary tract infections/ prostatitis.  He also has GERD, depression and migraines. Patient was at his usual state of health until this morning when he experienced dysuria and increased urinary frequency, moderate to severe in intensity, associated with fevers and chills.  No improving or worsening factors.  He has had urinary infections before that lead to sepsis E coli ESBL).  He recognized his symptoms, and went to his primary care provider who referred him to the hospital for further evaluation.  His last hospitalization was in February, from the second to the ninth, with a working diagnosis of sepsis due to urine infection, treated with meropenem and fosfomycin.  Patient has been fully vaccinated for COVID-19.  ED Course: Patient was found tachycardic and tachypneic, positive pyuria and leukocytosis along with elevated lactic acid.  He received 3500 cc of IV isotonic saline and intravenous meropenem, then referred for further hospitalization.  Subjective:  Heart rate back to  normal sinus rhythm Dysuria has improved, overall feeling better denies chest pain , no shortness of breath, no dizziness Wife at bedside  Assessment & Plan:   Principal Problem:   Sepsis due to gram-negative UTI ([place])  Active Problems:   GERD (gastroesophageal reflux disease)   Migraine   Depression   Anxiety   AKI (acute kidney injury) ([place])   Prostate cancer (HCC)   Obesity (BMI 30.0-34.9)   Severe sepsis due to UTI -He was aggressively resuscitated with IV fluids, he received IV meropenem due  to past history of ESBL in February 2021, there is initial concern of needing vasopressor, however his blood pressure has improved with aggressive resuscitation, he is transferred out of ICU -Blood culture no growth, urine culture + ecoli, final result pending -Leukocytosis and lactic acidosis have esolved, continue hydration, continue current antibiotics, awaiting for final urine culture result  PM addendum,  Of note, he has one unexpected fever of 101 this morning around 8:00, he denies new complaints, reports overall feeling better, dysuria has resolved, repeat blood culture ordered and  encourage him to ambulate,  If fever persists may consider CT abdomen pelvis with  IV contrast later today, Over the days he continued to feel better, no recurrent fever, he agreed to hold off on CT test Follow urine culture result and repeat blood culture result   Thrombocytopenia, mild Likely due to sepsis Repeat CBC, expect improvement with treating sepsis  New onset of A. fib RVR -Reported history of palpitation, but never been diagnosed with A. fib in the past -High-sensitivity troponin 29-34 - echocardiogram unremarkable -Converted to sinus rhythm after additional IV fluids bolus, IV magnesium -CHADS2 score 0, he does not need anticoagulation -Cardiology consulted, he is started on amiodarone  AKI on CKD 2, anemia of chronic disease(mild, hemoglobin around 12) Renal ultrasound no Hydronephrosis Creatinine on presentation was 1.42, creatinine normalized to 1.05 today Continue hydration for another 10 hours, repeat BMP in the morning, renal dosing meds  History of BPH, prostate cancer Renal ultrasound demonstrated"Lobulated enlargement of the prostate measuring 6.4 x 5.8 x 5.9 cm with volume of 115.5 cm.  Mass effect on the bladder." Continue flomax He is followed at [facility] urology, next appointment on June 14.  DVT prophylaxis: Lovenox Code Status: Full Family Communication: Wife at  bedside Disposition:   Status is: Inpatient  Dispo: The patient is from: Home              Anticipated d/c is to: Home              Anticipated d/c date is: 24 to 48 hours             Awaiting urine culture, need cardiology clearance  Consultants:   Cardiology  Critical care  Procedures:   None  Antimicrobials:    Meropenem    Objective: Vitals:   04/11/20 0025 04/11/20 0534 04/11/20 0855 04/11/20 1409  BP: (!) 111/58 136/79 122/71 115/76  Pulse: 88 76 83 68  Resp: 16 16 18 16   Temp: 100 F (37.8 C) 100.1 F (37.8 C) (!) 101 F (38.3 C) 98.3 F (36.8 C)  TempSrc: Oral Oral Oral Oral  SpO2: 96% 96% 97%   Weight:      Height:        Intake/Output Summary (Last 24 hours) at 04/11/2020 1503 Last data filed at 04/11/2020 1230 Gross per 24 hour  Intake 2339.15 ml  Output 1650 ml  Net 689.15 ml   Filed Weights   04/08/20 2008 04/09/20 0300  Weight: 105.8 kg 96.6 kg    Examination:  General exam: calm, NAD Respiratory system: Clear to auscultation. Respiratory effort normal. Cardiovascular system: IRRR Gastrointestinal system: Abdomen is nondistended, soft and nontender. No organomegaly or masses felt. Normal bowel sounds heard. Central nervous system: Alert and oriented. No focal neurological deficits. Extremities: Symmetric 5 x 5 power. Skin: No rashes, lesions or ulcers Psychiatry: Judgement and insight appear normal. Mood & affect appropriate.     Data Reviewed: I have personally reviewed following labs and imaging studies  CBC: Recent Labs  Lab 04/08/20 1458 04/09/20 0303 04/10/20 1020 04/11/20 0338  WBC 18.9* 22.4* 8.0 4.8  NEUTROABS 17.2*  --  6.6 3.7  HGB 16.0 12.7* 12.3* 12.8*  HCT 48.8 38.3* 37.5* 39.7  MCV 93.5 94.1 94.9 95.4  PLT 223 166 136* 129*    Basic Metabolic Panel: Recent Labs  Lab 04/08/20 1458 04/09/20 0303 04/10/20 1020 04/11/20 0338  NA 138 138 141 138  K 4.2 4.3 3.7 3.9  CL 106 112* 113* 110  CO2 20* 21* 21*  23  GLUCOSE 108* 125* 122* 115*  BUN 16 19 11 9   CREATININE 1.42* 1.34* 1.14 1.05  CALCIUM 9.3 8.5* 8.2* 8.1*  MG  --   --   --  1.9    GFR: Estimated Creatinine Clearance: 85 mL/min (by C-G formula based on SCr of 1.05 mg/dL).  Liver Function Tests: Recent Labs  Lab 04/08/20 1458 04/10/20 1020  AST 21 16  ALT 20 17  ALKPHOS 82 52  BILITOT 1.5* 0.8  PROT 7.1 5.3*  ALBUMIN 4.5 2.9*    CBG: Recent Labs  Lab 04/09/20 0227  GLUCAP 128*     Recent Results (from the past 240 hour(s))  Culture, blood (Routine x 2)     Status: None (Preliminary result)   Collection Time: 04/08/20  2:58 PM   Specimen: BLOOD  Result Value Ref Range Status   Specimen Description BLOOD LEFT ANTECUBITAL  Final   Special Requests   Final    BOTTLES DRAWN AEROBIC ONLY Blood Culture adequate volume  Culture   Final    NO GROWTH 3 DAYS Performed at [facility], [place] [address] [place], [place] [address]    Report Status PENDING  Incomplete  Urine culture     Status: Abnormal (Preliminary result)   Collection Time: 04/08/20  4:33 PM   Specimen: In/Out Cath Urine  Result Value Ref Range Status   Specimen Description IN/OUT CATH URINE  Final   Special Requests NONE  Final   Culture (A)  Final    >=100,000 COLONIES/mL ESCHERICHIA COLI SUSCEPTIBILITIES TO FOLLOW Performed at [facility], [place] [address], [place], [place] [address]    Report Status PENDING  Incomplete  SARS Coronavirus 2 by RT PCR (hospital order, performed in [facility] hospital lab) Nasopharyngeal Nasopharyngeal Swab     Status: None   Collection Time: 04/08/20  4:53 PM   Specimen: Nasopharyngeal Swab  Result Value Ref Range Status   SARS Coronavirus 2 NEGATIVE NEGATIVE Final    Comment: (NOTE) SARS-CoV-2 target nucleic acids are NOT DETECTED. The SARS-CoV-2 RNA is generally detectable in upper and lower respiratory specimens during the acute phase of infection. The lowest concentration of SARS-CoV-2 viral  copies this assay can detect is 250 copies / mL. A negative result does not preclude SARS-CoV-2 infection and should not be used as the sole basis for treatment or other patient management decisions.  A negative result may occur with improper specimen collection / handling, submission of specimen other than nasopharyngeal swab, presence of viral mutation(s) within the areas targeted by this assay, and inadequate number of viral copies (<250 copies / mL). A negative result must be combined with clinical observations, patient history, and epidemiological information. Fact Sheet for Patients:   StrictlyIdeas.no Fact Sheet for Healthcare Providers: BankingDealers.co.za This test is not yet approved or cleared  by the [place] FDA and has been authorized for detection and/or diagnosis of SARS-CoV-2 by FDA under an Emergency Use Authorization (EUA).  This EUA will remain in effect (meaning this test can be used) for the duration of the COVID-19 declaration under Section 564(b)(1) of the Act, 21 U.S.C. section 360bbb-3(b)(1), unless the authorization is terminated or revoked sooner. Performed at [facility], [place] [address], [place], [place] [address]   Culture, blood (Routine x 2)     Status: None (Preliminary result)   Collection Time: 04/08/20  5:00 PM   Specimen: BLOOD  Result Value Ref Range Status   Specimen Description BLOOD LEFT ANTECUBITAL  Final   Special Requests   Final    BOTTLES DRAWN AEROBIC AND ANAEROBIC Blood Culture results may not be optimal due to an inadequate volume of blood received in culture bottles   Culture   Final    NO GROWTH 3 DAYS Performed at [facility], [place] [address], [place], [place] [address]    Report Status PENDING  Incomplete  MRSA PCR Screening     Status: None   Collection Time: 04/09/20  3:34 AM   Specimen: Nasopharyngeal  Result Value Ref Range Status   MRSA by PCR NEGATIVE  NEGATIVE Final    Comment:        The GeneXpert MRSA Assay (FDA approved for NASAL specimens only), is one component of a comprehensive MRSA colonization surveillance program. It is not intended to diagnose MRSA infection nor to guide or monitor treatment for MRSA infections. Performed at [facility], [place] [address] [place], [place] [address]  Radiology Studies: US RENAL  Result Date: 04/09/2020 CLINICAL DATA:  Elevated creatinine EXAM: RENAL / URINARY TRACT ULTRASOUND COMPLETE COMPARISON:  CT 12/21/2019 FINDINGS: Right Kidney: Renal measurements: 11.3 x 5.3 x 6.8 cm = volume: 212.4 mL . Echogenicity within normal limits. No mass or hydronephrosis visualized. Left Kidney: Renal measurements: 11.2 x 6.2 x 6.8 cm = volume: 247.3 mL. Echogenicity within normal limits. No mass or hydronephrosis visualized. Bladder: Appears normal for degree of bladder distention. Other: Lobulated enlargement of the prostate measuring 6.4 x 5.8 x 5.9 cm with volume of 115.5 cm. Mass effect on the bladder. IMPRESSION: 1. Negative ultrasound appearance of the kidneys. 2. Irregular enlargement of the prostate with mass effect on the bladder. Electronically Signed   By: Donavan Foil M.D.   On: 04/09/2020 17:35   ECHOCARDIOGRAM COMPLETE  Result Date: 04/11/2020    ECHOCARDIOGRAM REPORT   Patient Name:   SAHEIM GEARY [facility] Date of Exam: 04/11/2020 Medical Rec #:  [id number]        Height:       70.0 in Accession #:    [id number]       Weight:       213.0 lb Date of Birth:  1956/07/30        BSA:          2.144 m Patient Age:    50 years         BP:           136/79 mmHg Patient Gender: M                HR:           76 bpm. Exam Location:  Inpatient Procedure: 2D Echo Indications:    Atrial Fibrillation 427.31 / I48.91  History:        Patient has no prior history of Echocardiogram examinations.                 Risk Factors:Former Smoker. GERD, History of ESBL E. coli                 infection, Sepsis.   Sonographer:    Leavy Cella Referring Phys: [id number] [place]  1. Normal LV systolic function; mild LVH.  2. Left ventricular ejection fraction, by estimation, is 60 to 65%. The left ventricle has normal function. The left ventricle has no regional wall motion abnormalities. There is mild left ventricular hypertrophy. Left ventricular diastolic parameters were normal.  3. Right ventricular systolic function is normal. The right ventricular size is normal.  4. The mitral valve is normal in structure. Trivial mitral valve regurgitation. No evidence of mitral stenosis.  5. The aortic valve is tricuspid. Aortic valve regurgitation is not visualized. No aortic stenosis is present.  6. The inferior vena cava is dilated in size with >50% respiratory variability, suggesting right atrial pressure of 8 mmHg. FINDINGS  Left Ventricle: Left ventricular ejection fraction, by estimation, is 60 to 65%. The left ventricle has normal function. The left ventricle has no regional wall motion abnormalities. The left ventricular internal cavity size was normal in size. There is  mild left ventricular hypertrophy. Left ventricular diastolic parameters were normal. Right Ventricle: The right ventricular size is normal.Right ventricular systolic function is normal. Left Atrium: Left atrial size was normal in size. Right Atrium: Right atrial size was normal in size. Pericardium: There is no evidence of pericardial effusion. Mitral Valve: The mitral valve is normal in structure. Normal mobility of the  mitral valve leaflets. Trivial mitral valve regurgitation. No evidence of mitral valve stenosis. Tricuspid Valve: The tricuspid valve is normal in structure. Tricuspid valve regurgitation is trivial. No evidence of tricuspid stenosis. Aortic Valve: The aortic valve is tricuspid. Aortic valve regurgitation is not visualized. No aortic stenosis is present. Pulmonic Valve: The pulmonic valve was not well visualized. Pulmonic valve  regurgitation is not visualized. No evidence of pulmonic stenosis. Aorta: The aortic root is normal in size and structure. Venous: The inferior vena cava is dilated in size with greater than 50% respiratory variability, suggesting right atrial pressure of 8 mmHg. IAS/Shunts: No atrial level shunt detected by color flow Doppler. Additional Comments: Normal LV systolic function; mild LVH.  LEFT VENTRICLE PLAX 2D LVIDd:         3.69 cm  Diastology LVIDs:         3.07 cm  LV e' lateral:   11.80 cm/s LV PW:         1.29 cm  LV E/e' lateral: 7.7 LV IVS:        1.35 cm  LV e' medial:    9.57 cm/s LVOT diam:     1.90 cm  LV E/e' medial:  9.5 LVOT Area:     2.84 cm  RIGHT VENTRICLE RV S prime:     20.50 cm/s TAPSE (M-mode): 2.8 cm LEFT ATRIUM             Index       RIGHT ATRIUM           Index LA diam:        3.70 cm 1.73 cm/m  RA Area:     18.10 cm LA Vol (A2C):   61.7 ml 28.78 ml/m RA Volume:   56.00 ml  26.12 ml/m LA Vol (A4C):   40.9 ml 19.08 ml/m LA Biplane Vol: 51.1 ml 23.84 ml/m   AORTA Ao Root diam: 3.00 cm MITRAL VALVE MV Area (PHT): 4.21 cm    SHUNTS MV Decel Time: 180 msec    Systemic Diam: 1.90 cm MV E velocity: 90.80 cm/s MV A velocity: 49.30 cm/s MV E/A ratio:  1.84 Kirk Ruths MD Electronically signed by Kirk Ruths MD Signature Date/Time: 04/11/2020/10:21:39 AM    Final         Scheduled Meds: . amiodarone  400 mg Oral BID   Followed by  . [START ON 04/17/2020] amiodarone  200 mg Oral Daily  . bisacodyl  10 mg Oral Daily  . Chlorhexidine Gluconate Cloth  6 each Topical Daily  . enoxaparin (LOVENOX) injection  40 mg Subcutaneous Q24H  . multivitamin with minerals  1 tablet Oral Daily  . pantoprazole  40 mg Oral Daily  . tamsulosin  0.4 mg Oral Daily   Continuous Infusions: . sodium chloride 100 mL/hr at 04/11/20 1429  . sodium chloride 20 mL/hr at 04/10/20 1650  . meropenem (MERREM) IV 1 g (04/11/20 1345)  . sodium chloride       LOS: 3 days     Time spent: 64mins I  have personally reviewed and interpreted on  04/11/2020 daily labs, tele strips, imagings as discussed above under date review session and assessment and plans.  I reviewed all nursing notes, pharmacy notes, consultant notes,  vitals, pertinent old records  I have discussed plan of care as described above with RN , patient and family on 04/11/2020  Voice Recognition /Dragon dictation system was used to create this note, attempts have been made to correct errors.  Please contact the author with questions and/or clarifications.   Florencia Reasons, MD PhD FACP [facility]  Available via Epic secure chat 7am-7pm for nonurgent issues Please page for urgent issues To page the attending provider between 7A-7P or the covering provider during after hours 7P-7A, please log into the web site [email or web address] and access using universal [place] password for that web site. If you do not have the password, please call the hospital operator.    04/11/2020, 3:03 PM

## 2020-04-11 NOTE — Progress Notes (Signed)
*  PRELIMINARY RESULTS* Echocardiogram 2D Echocardiogram has been performed.  Leavy Cella 04/11/2020, 8:50 AM

## 2020-04-12 DIAGNOSIS — G43809 Other migraine, not intractable, without status migrainosus: Secondary | ICD-10-CM

## 2020-04-12 LAB — URINE CULTURE: Culture: >=100000 — AB

## 2020-04-12 LAB — BASIC METABOLIC PANEL
Anion gap: 7 (ref 5–15)
BUN: 10 mg/dL (ref 8–23)
CO2: 22 mmol/L (ref 22–32)
Calcium: 8 mg/dL — ABNORMAL LOW (ref 8.9–10.3)
Chloride: 109 mmol/L (ref 98–111)
Creatinine, Ser: 1.03 mg/dL (ref 0.61–1.24)
GFR calc Af Amer: >60 mL/min (ref >60)
GFR calc non Af Amer: >60 mL/min (ref >60)
Glucose, Bld: 126 mg/dL — ABNORMAL HIGH (ref 70–99)
Potassium: 3.7 mmol/L (ref 3.5–5.1)
Sodium: 138 mmol/L (ref 135–145)

## 2020-04-12 LAB — CBC WITH DIFFERENTIAL/PLATELET
Abs Immature Granulocytes: 0.04 10*3/uL (ref 0.00–0.07)
Basophils Absolute: 0 10*3/uL (ref 0.0–0.1)
Basophils Relative: 0 %
Eosinophils Absolute: 0 10*3/uL (ref 0.0–0.5)
Eosinophils Relative: 0 %
HCT: 36.6 % — ABNORMAL LOW (ref 39.0–52.0)
Hemoglobin: 12.1 g/dL — ABNORMAL LOW (ref 13.0–17.0)
Immature Granulocytes: 1 %
Lymphocytes Relative: 13 %
Lymphs Abs: 0.9 10*3/uL (ref 0.7–4.0)
MCH: 30.7 pg (ref 26.0–34.0)
MCHC: 33.1 g/dL (ref 30.0–36.0)
MCV: 92.9 fL (ref 80.0–100.0)
Monocytes Absolute: 1 10*3/uL (ref 0.1–1.0)
Monocytes Relative: 14 %
Neutro Abs: 5 10*3/uL (ref 1.7–7.7)
Neutrophils Relative %: 72 %
Platelets: 156 10*3/uL (ref 150–400)
RBC: 3.94 MIL/uL — ABNORMAL LOW (ref 4.22–5.81)
RDW: 13.1 % (ref 11.5–15.5)
WBC: 7 10*3/uL (ref 4.0–10.5)
nRBC: 0 % (ref 0.0–0.2)

## 2020-04-12 LAB — MAGNESIUM: Magnesium: 1.7 mg/dL (ref 1.7–2.4)

## 2020-04-12 LAB — LACTIC ACID, PLASMA: Lactic Acid, Venous: 0.9 mmol/L (ref 0.5–1.9)

## 2020-04-12 MED ORDER — SODIUM CHLORIDE 0.9% FLUSH
10.0000 mL | INTRAVENOUS | Status: DC | PRN
  Administered 2020-04-13: 10 mL

## 2020-04-12 MED ORDER — MELATONIN 5 MG PO TABS
20.0000 mg | ORAL_TABLET | Freq: Every day | ORAL | Status: DC
  Administered 2020-04-12 (×2): 20 mg via ORAL
  Filled 2020-04-12 (×2): qty 4

## 2020-04-12 MED ORDER — SODIUM CHLORIDE 0.9% FLUSH
10.0000 mL | Freq: Two times a day (BID) | INTRAVENOUS | Status: DC
  Administered 2020-04-12 – 2020-04-13 (×3): 10 mL

## 2020-04-12 NOTE — Progress Notes (Signed)
PHARMACY CONSULT NOTE FOR:  OUTPATIENT  PARENTERAL ANTIBIOTIC THERAPY (OPAT)  Indication: UTI Regimen: Ertapenem 1 gm IV q24h End date: 04/26/2020  IV antibiotic discharge orders are pended. To discharging provider:  please sign these orders via discharge navigator,  Select New Orders & click on the button choice - Manage This Unsigned Work.     Thank you for allowing pharmacy to be a part of this patient's care.  Berenice Bouton, PharmD PGY1 Pharmacy Resident  Please check AMION for all [place] phone numbers After 10:00 PM, call [place] [phone]  04/12/2020, 12:12 PM

## 2020-04-12 NOTE — Progress Notes (Signed)
Peripherally Inserted Central Catheter Placement  The IV Nurse has discussed with the patient and/or persons authorized to consent for the patient, the purpose of this procedure and the potential benefits and risks involved with this procedure.  The benefits include less needle sticks, lab draws from the catheter, and the patient may be discharged home with the catheter. Risks include, but not limited to, infection, bleeding, blood clot (thrombus formation), and puncture of an artery; nerve damage and irregular heartbeat and possibility to perform a PICC exchange if needed/ordered by physician.  Alternatives to this procedure were also discussed.  Bard Power PICC patient education guide, fact sheet on infection prevention and patient information card has been provided to patient /or left at bedside.    PICC Placement Documentation  PICC Single Lumen [id number] PICC Right Basilic 38 cm 0 cm (Active)  Indication for Insertion or Continuance of Line Home intravenous therapies (PICC only) 04/12/20 1525  Exposed Catheter (cm) 0 cm 04/12/20 1525  Site Assessment Clean;Dry;Intact 04/12/20 1525  Line Status Flushed;Saline locked;Blood return noted 04/12/20 1525  Dressing Type Transparent 04/12/20 1525  Dressing Status Clean;Dry;Antimicrobial disc in place;Intact 04/12/20 1525  Safety Lock Not Applicable [id number]  Dressing Intervention New dressing 04/12/20 1525  Dressing Change Due 04/19/21 04/12/20 1525       Gordan Payment 04/12/2020, 3:26 PM

## 2020-04-12 NOTE — Plan of Care (Signed)

## 2020-04-12 NOTE — Progress Notes (Deleted)
PICC line/home IV set up

## 2020-04-12 NOTE — Progress Notes (Signed)
PROGRESS NOTE    Craig Butler  [id number]  DOB: October 27, 1956  PCP: Lawerance Cruel, MD Admit date:04/08/2020 64 y.o.M with BPHand prostate cancerwith recurrent urinary tract infections/ prostatitis, GERD, depression and migraines  presented with c/o dysuria and increased urinary frequency, associated with fevers and chills. He has had urinary infections before that lead to E coli ESBL sepsis. He recognizedhis symptoms, andwent to his primary care provider who referred him to the hospital for further evaluation.His last hospitalization was in February, when he was treated for urosepsis with meropenem and fosfomycin.Patient has been fully vaccinated for COVID-19. ED Course: Afebrile, found tachycardic and tachypneic, positive pyuria and leukocytosis along with elevated lactic acid. He received 3500 cc of IVisotonic saline and intravenous meropenem,thenreferred for further hospitalization. Hospital course: Patient admitted to [facility] for further evaluation and management. He has responded well to IV meropenem. States follows [facility] Dr Kerri Perches for prostarte issues and has an appointment for 6/14.    Subjective:  Patient anxious to go home today. States has done home IV therapy in the past.   Objective: Vitals:   04/11/20 1540 04/11/20 2004 04/12/20 0539 04/12/20 0734  BP:  121/82 (!) 105/59 106/63  Pulse:  64 70 75  Resp:  18 16 17   Temp: 99.2 F (37.3 C) 98.9 F (37.2 C) 98.4 F (36.9 C) 99.5 F (37.5 C)  TempSrc:  Oral Oral Oral  SpO2:  97% 97% 96%  Weight:      Height:        Intake/Output Summary (Last 24 hours) at 04/12/2020 0846 Last data filed at 04/12/2020 0600 Gross per 24 hour  Intake 2663.95 ml  Output 1350 ml  Net 1313.95 ml   Filed Weights   04/08/20 2008 04/09/20 0300  Weight: 105.8 kg 96.6 kg    Physical Examination:  General exam: Appears calm and comfortable  Respiratory system: Clear to auscultation. Respiratory effort  normal. Cardiovascular system: S1 & S2 heard, RRR. No JVD, murmurs, rubs, gallops or clicks. No pedal edema. Gastrointestinal system: Abdomen is nondistended, soft and nontender. Normal bowel sounds heard. Central nervous system: Alert and oriented. No new focal neurological deficits. Extremities: No contractures, edema or joint deformities.  Skin: No rashes, lesions or ulcers Psychiatry: Judgement and insight appear normal. Mood & affect appropriate.   Data Reviewed: I have personally reviewed following labs and imaging studies  CBC: Recent Labs  Lab 04/08/20 1458 04/09/20 0303 04/10/20 1020 04/11/20 0338 04/12/20 0343  WBC 18.9* 22.4* 8.0 4.8 7.0  NEUTROABS 17.2*  --  6.6 3.7 5.0  HGB 16.0 12.7* 12.3* 12.8* 12.1*  HCT 48.8 38.3* 37.5* 39.7 36.6*  MCV 93.5 94.1 94.9 95.4 92.9  PLT 223 166 136* 129* [id number]   Basic Metabolic Panel: Recent Labs  Lab 04/08/20 1458 04/09/20 0303 04/10/20 1020 04/11/20 0338 04/12/20 0343  NA 138 138 141 138 138  K 4.2 4.3 3.7 3.9 3.7  CL 106 112* 113* 110 109  CO2 20* 21* 21* 23 22  GLUCOSE 108* 125* 122* 115* 126*  BUN 16 19 11 9 10   CREATININE 1.42* 1.34* 1.14 1.05 1.03  CALCIUM 9.3 8.5* 8.2* 8.1* 8.0*  MG  --   --   --  1.9 1.7   GFR: Estimated Creatinine Clearance: 86.7 mL/min (by C-G formula based on SCr of 1.03 mg/dL). Liver Function Tests: Recent Labs  Lab 04/08/20 1458 04/10/20 1020  AST 21 16  ALT 20 17  ALKPHOS 82 52  BILITOT 1.5* 0.8  PROT 7.1 5.3*  ALBUMIN 4.5 2.9*   No results for input(s): LIPASE, AMYLASE in the last 168 hours. No results for input(s): AMMONIA in the last 168 hours. Coagulation Profile: Recent Labs  Lab 04/08/20 1458  INR 1.1   Cardiac Enzymes: No results for input(s): CKTOTAL, CKMB, CKMBINDEX, TROPONINI in the last 168 hours. BNP (last 3 results) No results for input(s): PROBNP in the last 8760 hours. HbA1C: No results for input(s): HGBA1C in the last 72 hours. CBG: Recent Labs  Lab  04/09/20 0227  GLUCAP 128*   Lipid Profile: No results for input(s): CHOL, HDL, LDLCALC, TRIG, CHOLHDL, LDLDIRECT in the last 72 hours. Thyroid Function Tests: Recent Labs    04/11/20 0338  TSH 2.390   Anemia Panel: No results for input(s): VITAMINB12, FOLATE, FERRITIN, TIBC, IRON, RETICCTPCT in the last 72 hours. Sepsis Labs: Recent Labs  Lab 04/08/20 1458 04/08/20 1653 04/10/20 1020 04/12/20 0343  LATICACIDVEN 2.3* 1.9 1.2 0.9    Recent Results (from the past 240 hour(s))  Culture, blood (Routine x 2)     Status: None (Preliminary result)   Collection Time: 04/08/20  2:58 PM   Specimen: BLOOD  Result Value Ref Range Status   Specimen Description BLOOD LEFT ANTECUBITAL  Final   Special Requests   Final    BOTTLES DRAWN AEROBIC ONLY Blood Culture adequate volume   Culture   Final    NO GROWTH 4 DAYS Performed at [facility] [address], [place], [place] [address]    Report Status PENDING  Incomplete  Urine culture     Status: Abnormal   Collection Time: 04/08/20  4:33 PM   Specimen: In/Out Cath Urine  Result Value Ref Range Status   Specimen Description IN/OUT CATH URINE  Final   Special Requests   Final    NONE Performed at [facility], [place] [address], [place], [place] [address]    Culture (A)  Final    >=100,000 COLONIES/mL ESCHERICHIA COLI Confirmed Extended Spectrum Beta-Lactamase Producer (ESBL).  In bloodstream infections from ESBL organisms, carbapenems are preferred over piperacillin/tazobactam. They are shown to have a lower risk of mortality.    Report Status 04/12/2020 FINAL  Final   Organism ID, Bacteria ESCHERICHIA COLI (A)  Final      Susceptibility   Escherichia coli - MIC*    AMPICILLIN >=32 RESISTANT Resistant     CEFAZOLIN >=64 RESISTANT Resistant     CEFTRIAXONE >=64 RESISTANT Resistant     CIPROFLOXACIN >=4 RESISTANT Resistant     GENTAMICIN <=1 SENSITIVE Sensitive     IMIPENEM <=0.25 SENSITIVE Sensitive      NITROFURANTOIN <=16 SENSITIVE Sensitive     TRIMETH/SULFA >=320 RESISTANT Resistant     AMPICILLIN/SULBACTAM 8 SENSITIVE Sensitive     PIP/TAZO <=4 SENSITIVE Sensitive     * >=100,000 COLONIES/mL ESCHERICHIA COLI  SARS Coronavirus 2 by RT PCR (hospital order, performed in [facility] hospital lab) Nasopharyngeal Nasopharyngeal Swab     Status: None   Collection Time: 04/08/20  4:53 PM   Specimen: Nasopharyngeal Swab  Result Value Ref Range Status   SARS Coronavirus 2 NEGATIVE NEGATIVE Final    Comment: (NOTE) SARS-CoV-2 target nucleic acids are NOT DETECTED. The SARS-CoV-2 RNA is generally detectable in upper and lower respiratory specimens during the acute phase of infection. The lowest concentration of SARS-CoV-2 viral copies this assay can detect is 250 copies / mL. A negative result does not preclude SARS-CoV-2 infection and should not  be used as the sole basis for treatment or other patient management decisions.  A negative result may occur with improper specimen collection / handling, submission of specimen other than nasopharyngeal swab, presence of viral mutation(s) within the areas targeted by this assay, and inadequate number of viral copies (<250 copies / mL). A negative result must be combined with clinical observations, patient history, and epidemiological information. Fact Sheet for Patients:   StrictlyIdeas.no Fact Sheet for Healthcare Providers: BankingDealers.co.za This test is not yet approved or cleared  by the [place] FDA and has been authorized for detection and/or diagnosis of SARS-CoV-2 by FDA under an Emergency Use Authorization (EUA).  This EUA will remain in effect (meaning this test can be used) for the duration of the COVID-19 declaration under Section 564(b)(1) of the Act, 21 U.S.C. section 360bbb-3(b)(1), unless the authorization is terminated or revoked sooner. Performed at [facility],  [place] [address] [place], [place] [address]   Culture, blood (Routine x 2)     Status: None (Preliminary result)   Collection Time: 04/08/20  5:00 PM   Specimen: BLOOD  Result Value Ref Range Status   Specimen Description BLOOD LEFT ANTECUBITAL  Final   Special Requests   Final    BOTTLES DRAWN AEROBIC AND ANAEROBIC Blood Culture results may not be optimal due to an inadequate volume of blood received in culture bottles   Culture   Final    NO GROWTH 4 DAYS Performed at [facility], [place] [address] [place], [place] [address]    Report Status PENDING  Incomplete  MRSA PCR Screening     Status: None   Collection Time: 04/09/20  3:34 AM   Specimen: Nasopharyngeal  Result Value Ref Range Status   MRSA by PCR NEGATIVE NEGATIVE Final    Comment:        The GeneXpert MRSA Assay (FDA approved for NASAL specimens only), is one component of a comprehensive MRSA colonization surveillance program. It is not intended to diagnose MRSA infection nor to guide or monitor treatment for MRSA infections. Performed at [facility], [place] [address] [place], [place] [address]   Culture, blood (routine x 2)     Status: None (Preliminary result)   Collection Time: 04/11/20 10:37 AM   Specimen: BLOOD  Result Value Ref Range Status   Specimen Description BLOOD RIGHT ANTECUBITAL  Final   Special Requests   Final    BOTTLES DRAWN AEROBIC AND ANAEROBIC Blood Culture adequate volume   Culture   Final    NO GROWTH < 24 HOURS Performed at [facility], [place] [address], [place], [place] [address]    Report Status PENDING  Incomplete  Culture, blood (routine x 2)     Status: None (Preliminary result)   Collection Time: 04/11/20 10:39 AM   Specimen: BLOOD RIGHT HAND  Result Value Ref Range Status   Specimen Description BLOOD RIGHT HAND  Final   Special Requests   Final    BOTTLES DRAWN AEROBIC AND ANAEROBIC Blood Culture adequate volume   Culture   Final    NO GROWTH < 24 HOURS Performed  at [facility], [place] [address] [place], [place] [address]    Report Status PENDING  Incomplete      Radiology Studies: ECHOCARDIOGRAM COMPLETE  Result Date: 04/11/2020    ECHOCARDIOGRAM REPORT   Patient Name:   Craig Butler [facility] Date of Exam: 04/11/2020 Medical Rec #:  [id number]  Height:       70.0 in Accession #:    [id number]       Weight:       213.0 lb Date of Birth:  11-04-1956        BSA:          2.144 m Patient Age:    8 years         BP:           136/79 mmHg Patient Gender: M                HR:           76 bpm. Exam Location:  Inpatient Procedure: 2D Echo Indications:    Atrial Fibrillation 427.31 / I48.91  History:        Patient has no prior history of Echocardiogram examinations.                 Risk Factors:Former Smoker. GERD, History of ESBL E. coli                 infection, Sepsis.  Sonographer:    Leavy Cella Referring Phys: [id number] [place]  1. Normal LV systolic function; mild LVH.  2. Left ventricular ejection fraction, by estimation, is 60 to 65%. The left ventricle has normal function. The left ventricle has no regional wall motion abnormalities. There is mild left ventricular hypertrophy. Left ventricular diastolic parameters were normal.  3. Right ventricular systolic function is normal. The right ventricular size is normal.  4. The mitral valve is normal in structure. Trivial mitral valve regurgitation. No evidence of mitral stenosis.  5. The aortic valve is tricuspid. Aortic valve regurgitation is not visualized. No aortic stenosis is present.  6. The inferior vena cava is dilated in size with >50% respiratory variability, suggesting right atrial pressure of 8 mmHg. FINDINGS  Left Ventricle: Left ventricular ejection fraction, by estimation, is 60 to 65%. The left ventricle has normal function. The left ventricle has no regional wall motion abnormalities. The left ventricular internal cavity size was normal in size. There is  mild left ventricular  hypertrophy. Left ventricular diastolic parameters were normal. Right Ventricle: The right ventricular size is normal.Right ventricular systolic function is normal. Left Atrium: Left atrial size was normal in size. Right Atrium: Right atrial size was normal in size. Pericardium: There is no evidence of pericardial effusion. Mitral Valve: The mitral valve is normal in structure. Normal mobility of the mitral valve leaflets. Trivial mitral valve regurgitation. No evidence of mitral valve stenosis. Tricuspid Valve: The tricuspid valve is normal in structure. Tricuspid valve regurgitation is trivial. No evidence of tricuspid stenosis. Aortic Valve: The aortic valve is tricuspid. Aortic valve regurgitation is not visualized. No aortic stenosis is present. Pulmonic Valve: The pulmonic valve was not well visualized. Pulmonic valve regurgitation is not visualized. No evidence of pulmonic stenosis. Aorta: The aortic root is normal in size and structure. Venous: The inferior vena cava is dilated in size with greater than 50% respiratory variability, suggesting right atrial pressure of 8 mmHg. IAS/Shunts: No atrial level shunt detected by color flow Doppler. Additional Comments: Normal LV systolic function; mild LVH.  LEFT VENTRICLE PLAX 2D LVIDd:         3.69 cm  Diastology LVIDs:         3.07 cm  LV e' lateral:   11.80 cm/s LV PW:         1.29 cm  LV E/e' lateral: 7.7 LV  IVS:        1.35 cm  LV e' medial:    9.57 cm/s LVOT diam:     1.90 cm  LV E/e' medial:  9.5 LVOT Area:     2.84 cm  RIGHT VENTRICLE RV S prime:     20.50 cm/s TAPSE (M-mode): 2.8 cm LEFT ATRIUM             Index       RIGHT ATRIUM           Index LA diam:        3.70 cm 1.73 cm/m  RA Area:     18.10 cm LA Vol (A2C):   61.7 ml 28.78 ml/m RA Volume:   56.00 ml  26.12 ml/m LA Vol (A4C):   40.9 ml 19.08 ml/m LA Biplane Vol: 51.1 ml 23.84 ml/m   AORTA Ao Root diam: 3.00 cm MITRAL VALVE MV Area (PHT): 4.21 cm    SHUNTS MV Decel Time: 180 msec    Systemic  Diam: 1.90 cm MV E velocity: 90.80 cm/s MV A velocity: 49.30 cm/s MV E/A ratio:  1.84 Kirk Ruths MD Electronically signed by Kirk Ruths MD Signature Date/Time: 04/11/2020/10:21:39 AM    Final         Scheduled Meds: . amiodarone  400 mg Oral BID   Followed by  . [START ON 04/17/2020] amiodarone  200 mg Oral Daily  . bisacodyl  10 mg Oral Daily  . Chlorhexidine Gluconate Cloth  6 each Topical Daily  . enoxaparin (LOVENOX) injection  40 mg Subcutaneous Q24H  . melatonin  20 mg Oral QHS  . multivitamin with minerals  1 tablet Oral Daily  . pantoprazole  40 mg Oral Daily  . tamsulosin  0.4 mg Oral Daily   Continuous Infusions: . sodium chloride 20 mL/hr at 04/10/20 1650  . meropenem (MERREM) IV 1 g (04/12/20 0514)  . sodium chloride       Assessment/Plan:  Severe sepsis due to UTI-He was aggressively resuscitated with IV fluids, he received IV meropenem due to past history of ESBL. There was initial concern of needing vasopressor, however his blood pressure improved with aggressive resuscitation, he is transferred out of ICU. Blood culture no growth, urine culture + ecoli, final result pending.Leukocytosis and lactic acidosis have esolved, but still spiking fevers upto 101F on 5/30 at 7.55 am -repeat bld cx sent. Continue hydration, continue carbapenems as urine culture from 5/27 growing ESBL E coli. Since afebrile now, leucocytosis resolved, repeat vlood cx -ve in 24 hrs- will plan on transitioning meropenem to IV ertapenem from am and discharging patient with home IV therapy in am after he gets PICC line if remains afebrile and blood cx remain negative for growth. D/W Dr Graylon Good who recommended outpatient abx regimen x 2 weeks. Pharmacy/ID consult placed.  Thrombocytopenia, mild-Likely due to sepsis.Repeat CBC shows expected improvement with treating sepsis  New onset  A. Fib with RVR-Reported history of palpitation, but never been diagnosed with A. fib in the past-High-sensitivity  troponin 29-34. Echocardiogram unremarkable Converted to sinus rhythm after additional IV fluids bolus, IV magnesium.CHADS2 score 0, he does not need anticoagulation.Cardiology consulted, he is started on amiodarone  AKI on CKD 2-Renal ultrasound no Hydronephrosis.Creatinine on presentation was 1.42, creatinine normalized to 1.0 now.Continue hydration for another 10 hours, repeat BMP in the morning, renal dosing meds  Anemia of chronic kidney disease: mild, hemoglobin around 12  History of BPH, prostate cancer-Renal ultrasound demonstrated"Lobulated enlargement of the prostate measuring  6.4 x 5.8 x 5.9 cm with volume of 115.5 cm. Mass effect on the bladder."Continue flomax He is followed at [facility], next appointment on June 14.     DVT prophylaxis: Lovenox Code Status: Full Family / Patient Communication: d/w patient at length, he understands limitation in getting PICC line today with memorial day holiday-will d/c in am.  Disposition Plan:   Status is: Inpatient  Remains inpatient appropriate because:Ongoing diagnostic testing needed not appropriate for outpatient work up. Need for PICC/home IV rx   Dispo: The patient is from: Home              Anticipated d/c is to: Home              Anticipated d/c date is: 1 day              Patient currently is not medically stable to d/c. Pending home IV arrangement   LOS: 4 days    Time spent: 35 minutes.D/w bedside nurse, d/w ID     Guilford Shi, MD [facility] Pager in [place]  If 7PM-7AM, please contact night-coverage [email or web address] 04/12/2020, 8:46 AM

## 2020-04-13 ENCOUNTER — Inpatient Hospital Stay: Payer: Self-pay

## 2020-04-13 DIAGNOSIS — K219 Gastro-esophageal reflux disease without esophagitis: Secondary | ICD-10-CM

## 2020-04-13 LAB — CULTURE, BLOOD (ROUTINE X 2)
Culture: NO GROWTH
Culture: NO GROWTH
Special Requests: ADEQUATE

## 2020-04-13 MED ORDER — AMIODARONE HCL 200 MG PO TABS: 200.0000 mg | ORAL_TABLET | Freq: Every day | ORAL | 1 refills | Status: DC

## 2020-04-13 MED ORDER — ONDANSETRON HCL 4 MG PO TABS: 4.0000 mg | ORAL_TABLET | Freq: Four times a day (QID) | ORAL | 0 refills | Status: DC | PRN

## 2020-04-13 MED ORDER — FLAVOXATE HCL 100 MG PO TABS: 100.0000 mg | ORAL_TABLET | Freq: Three times a day (TID) | ORAL | 0 refills | Status: DC | PRN

## 2020-04-13 MED ORDER — ERTAPENEM IV (FOR PTA / DISCHARGE USE ONLY)
1.0000 g | INTRAVENOUS | 0 refills | Status: AC
Start: 2020-04-13 — End: 2020-04-27

## 2020-04-13 MED ORDER — HEPARIN SOD (PORK) LOCK FLUSH 100 UNIT/ML IV SOLN
250.0000 [IU] | INTRAVENOUS | Status: AC | PRN
  Administered 2020-04-13: 250 [IU]
  Filled 2020-04-13: qty 2.5

## 2020-04-13 MED ORDER — AMIODARONE HCL 400 MG PO TABS: 400.0000 mg | ORAL_TABLET | Freq: Two times a day (BID) | ORAL | 0 refills | Status: DC

## 2020-04-13 MED ORDER — SODIUM CHLORIDE 0.9 % IV SOLN
1.0000 g | INTRAVENOUS | Status: DC
  Administered 2020-04-13: 1000 mg via INTRAVENOUS
  Filled 2020-04-13: qty 1

## 2020-04-13 NOTE — Progress Notes (Signed)
Patient was discharged to his home with discharge planning for IV/ Home Health antibiotics

## 2020-04-13 NOTE — Discharge Summary (Signed)
Physician Discharge Summary  Craig Butler LZJ:[id number] DOB: 10-29-1956 DOA: 04/08/2020  PCP: Lawerance Cruel, MD  Admit date: 04/08/2020 Discharge date: 04/13/2020 Consultations: Cardiology Dr Marisue Ivan,  Phone consultation with ID Dr Graylon Good Admitted From: home Disposition: home  Discharge Diagnoses:  Principal Problem:   Sepsis due to gram-negative UTI [facility])  Active Problems:   GERD (gastroesophageal reflux disease)   Migraine   Depression   Anxiety   AKI (acute kidney injury) ([place])   Prostate cancer ([place])   Obesity (BMI 30.0-34.9)   Hospital Course Summary: 64 y.o.M with BPHand prostate cancerwith recurrent urinary tract infections/ prostatitis, GERD, depression and migraines  presented with c/o dysuria and increased urinary frequency, associated with fevers and chills. He has had urinary infections before that lead to E coli ESBL sepsis. He recognizedhis symptoms, andwent to his primary care provider who referred him to the hospital for further evaluation.His last hospitalization was in February, when he was treated for urosepsis with meropenem and fosfomycin.Patient has been fully vaccinated for COVID-19. ED Course: Afebrile, found tachycardic and tachypneic, positive pyuria and leukocytosis along with elevated lactic acid. He received 3500 cc of IVisotonic saline and intravenous meropenem,thenreferred for further hospitalization. Hospital course: Patient admitted to [facility] for further evaluation and management. He has responded well to IV meropenem. States follows [facility] Dr Kerri Perches for prostarte issues and has an appointment for 6/14.   Severe sepsis due to UTI-He was aggressively resuscitated with IV fluids, he received IV meropenem due to past history of ESBL. There was initial concern of needing vasopressor, however his blood pressure improved with aggressive resuscitation, he is transferred out of ICU. Blood culture no growth, urine culture+ ecoli, final result  pending.Leukocytosis and lactic acidosis resolved, patient was spiking fevers upto 101F (last episode on 5/30 at 7.55 am) -repeat bld cx sent and negative. Continued on hydration and Meropenem as urine culture from 5/27 growing ESBL E coli. Since afebrile now for 48 hrs , leucocytosis resolved, repeat blood cx -ve - PICC line inserted, transitioned to IV ertapenem and discharging patient with home IV therapy. D/W Dr Graylon Good who recommended outpatient abx regimen x 2 weeks. Pharmacy and ID input appreciated.   Thrombocytopenia, mild-Likely due to sepsis.Repeat CBC shows expected improvement with treating sepsis  New onset  A. Fib with RVR-Reported history of palpitation, but never been diagnosed with A. fib in the past-High-sensitivity troponin29-34. Echocardiogramunremarkable. Converted to sinus rhythm afteradditional IV fluids bolus, IV magnesium.CHADS2 score 0, he does not need anticoagulation.Cardiology consulted,he is started on amiodarone LD 449m BID x 7 days followed by MD 2045mdaily. Echo showed mild LVH, EF 60-65%.  [place]  AKI on CKD 2-Renal ultrasound no Hydronephrosis.Creatinine on presentation was 1.42,creatinine normalized to 1.0 now.Continue hydrationfor another 10 hours, repeat BMP in the morning, renal dosing meds  Anemia of chronic kidney disease: mild, hemoglobin stable around 12  History of BPH, prostate cancer-Renal ultrasound demonstrated"Lobulated enlargement of the prostate measuring 6.4 x 5.8 x 5.9 cm with volume of 115.5 cm. Mass effect on the bladder."Continue flomax [facility] [place] [facility] [place]   Discharge Exam:  Vitals:   04/13/20 0306 04/13/20 0743  BP: 130/80 125/74  Pulse: 61 66  Resp: 16 17  Temp: 98.3 F (36.8 C) 97.9 F (36.6 C)  SpO2: 93% 95%   Vitals:   04/12/20 1327 04/12/20 1927 04/13/20 0306 04/13/20 0743  BP: (!) 111/45 121/71 130/80 125/74  Pulse: 75  65 61 66   Resp: 16 16 16 17   Temp: 98.8 F (37.1 C) 98.7 F (37.1 C) 98.3 F (36.8 C) 97.9 F (36.6 C)  TempSrc: Oral Oral Oral Oral  SpO2: 97% 95% 93% 95%  Weight:      Height:        General: Pt is alert, awake, not in acute distress Cardiovascular: RRR, S1/S2 +, no rubs, no gallops Respiratory: CTA bilaterally, no wheezing, no rhonchi Abdominal: Soft, NT, ND, bowel sounds + Extremities: no edema, no cyanosis  Discharge Condition:Stable CODE STATUS: Full code Diet recommendation: Heart healthy Recommendations for Outpatient Follow-up:  1. Follow up with PCP: 1 week 2. Follow up with consultants: Cardiology in [facility] in 1 month, [facility] as scheduled on 6/14  3. Please obtain follow up labs including: CBC/BMP  Home Health services upon discharge: Home IV abx Equipment/Devices upon discharge: PICC line   Discharge Instructions:  Discharge Instructions    [facility] pharmacist to adjust dose for Vancomycin, Aminoglycosides and other anti-infective therapies as requested by physician.   Complete by: As directed    [facility] to provide Cath Flo 26m   Complete by: As directed    Administer for PICC line occlusion and as ordered by physician for other access device issues.   Anaphylaxis Kit: Provided to treat any anaphylactic reaction to the medication being provided to the patient if First Dose or when requested by physician   Complete by: As directed    Epinephrine 112mml vial / amp: Administer 0.56m59m0.56ml60mubcutaneously once for moderate to severe anaphylaxis, nurse to call physician and pharmacy when reaction occurs and call 911 if needed for immediate care   Diphenhydramine 50mg79mIV vial: Administer 25-50mg 77mM PRN for first dose reaction, rash, itching, mild reaction, nurse to call physician and pharmacy when reaction occurs   Sodium Chloride 0.9% NS 500ml I256mdminister if needed for hypovolemic blood pressure drop or as ordered by physician  after call to physician with anaphylactic reaction   Call MD for:  difficulty breathing, headache or visual disturbances   Complete by: As directed    Call MD for:  extreme fatigue   Complete by: As directed    Call MD for:  persistant dizziness or light-headedness   Complete by: As directed    Call MD for:  severe uncontrolled pain   Complete by: As directed    Call MD for:  temperature >100.4   Complete by: As directed    Change dressing on IV access line weekly and PRN   Complete by: As directed    Diet - low sodium heart healthy   Complete by: As directed    Flush IV access with Sodium Chloride 0.9% and Heparin 10 units/ml or 100 units/ml   Complete by: As directed    Home infusion instructions - [facility]   Complete by: As directed    Instructions: Flush IV access with Sodium Chloride 0.9% and Heparin 10units/ml or 100units/ml   Change dressing on IV access line: Weekly and PRN   Instructions Cath Flo 2mg: Ad20mister for PICC Line occlusion and as ordered by physician for other access device   [facility] pharmacist to adjust dose for: Vancomycin, Aminoglycosides and other anti-infective therapies as requested by physician   Increase activity slowly   Complete by: As directed    Method of administration may be changed at the discretion of home infusion pharmacist based upon assessment of the patient and/or  caregiver's ability to self-administer the medication ordered   Complete by: As directed      Allergies as of 04/13/2020      Reactions   Amoxicillin Rash      Medication List    TAKE these medications   amiodarone 400 MG tablet Commonly known as: PACERONE Take 1 tablet (400 mg total) by mouth 2 (two) times daily for 4 days.   amiodarone 200 MG tablet Commonly known as: PACERONE Take 1 tablet (200 mg total) by mouth daily. Start on 04/17/2020 after loading dose completed Start taking on: April 17, 2020   ertapenem  IVPB Commonly known as:  INVANZ Inject 1 g into the vein daily for 14 days. Indication:  UTI First Dose: Yes Last Day of Therapy:  04/26/20 Labs - Once weekly:  CBC/D and BMP, Labs - Every other week:  ESR and CRP Method of administration: Mini-Bag Plus / Gravity Method of administration may be changed at the discretion of home infusion pharmacist based upon assessment of the patient and/or caregiver's ability to self-administer the medication ordered.   flavoxATE 100 MG tablet Commonly known as: URISPAS Take 1 tablet (100 mg total) by mouth 3 (three) times daily as needed for bladder spasms.   fluticasone 50 MCG/ACT nasal spray Commonly known as: FLONASE Place 1 spray into both nostrils daily as needed for allergies or rhinitis.   multivitamin with minerals Tabs tablet Take 1 tablet by mouth daily.   omeprazole 20 MG capsule Commonly known as: PRILOSEC Take 20 mg by mouth daily.   ondansetron 4 MG tablet Commonly known as: ZOFRAN Take 1 tablet (4 mg total) by mouth every 6 (six) hours as needed for nausea.   saw palmetto 500 MG capsule Take 500 mg by mouth daily.   tamsulosin 0.4 MG Caps capsule Commonly known as: FLOMAX Take 1 capsule (0.4 mg total) by mouth daily.   VITAMIN B 12 PO Take 1 capsule by mouth daily.   vitamin C 1000 MG tablet Take 1,000 mg by mouth daily.            Discharge Care Instructions  (From admission, onward)         Start     Ordered   04/13/20 0000  Change dressing on IV access line weekly and PRN  (Home infusion instructions - [facility] )     04/13/20 0821          Allergies  Allergen Reactions  . Amoxicillin Rash      The results of significant diagnostics from this hospitalization (including imaging, microbiology, ancillary and laboratory) are listed below for reference.    Labs: BNP (last 3 results) No results for input(s): BNP in the last 8760 hours. Basic Metabolic Panel: Recent Labs  Lab 04/08/20 1458 04/09/20 0303  04/10/20 1020 04/11/20 0338 04/12/20 0343  NA 138 138 141 138 138  K 4.2 4.3 3.7 3.9 3.7  CL 106 112* 113* 110 109  CO2 20* 21* 21* 23 22  GLUCOSE 108* 125* 122* 115* 126*  BUN 16 19 11 9 10   CREATININE 1.42* 1.34* 1.14 1.05 1.03  CALCIUM 9.3 8.5* 8.2* 8.1* 8.0*  MG  --   --   --  1.9 1.7   Liver Function Tests: Recent Labs  Lab 04/08/20 1458 04/10/20 1020  AST 21 16  ALT 20 17  ALKPHOS 82 52  BILITOT 1.5* 0.8  PROT 7.1 5.3*  ALBUMIN 4.5 2.9*   No results for input(s): LIPASE,  AMYLASE in the last 168 hours. No results for input(s): AMMONIA in the last 168 hours. CBC: Recent Labs  Lab 04/08/20 1458 04/09/20 0303 04/10/20 1020 04/11/20 0338 04/12/20 0343  WBC 18.9* 22.4* 8.0 4.8 7.0  NEUTROABS 17.2*  --  6.6 3.7 5.0  HGB 16.0 12.7* 12.3* 12.8* 12.1*  HCT 48.8 38.3* 37.5* 39.7 36.6*  MCV 93.5 94.1 94.9 95.4 92.9  PLT 223 166 136* 129* 156   Cardiac Enzymes: No results for input(s): CKTOTAL, CKMB, CKMBINDEX, TROPONINI in the last 168 hours. BNP: Invalid input(s): POCBNP CBG: Recent Labs  Lab 04/09/20 0227  GLUCAP 128*   D-Dimer No results for input(s): DDIMER in the last 72 hours. Hgb A1c No results for input(s): HGBA1C in the last 72 hours. Lipid Profile No results for input(s): CHOL, HDL, LDLCALC, TRIG, CHOLHDL, LDLDIRECT in the last 72 hours. Thyroid function studies Recent Labs    04/11/20 0338  TSH 2.390   Anemia work up No results for input(s): VITAMINB12, FOLATE, FERRITIN, TIBC, IRON, RETICCTPCT in the last 72 hours. Urinalysis    Component Value Date/Time   COLORURINE YELLOW 04/08/2020 1524   APPEARANCEUR HAZY (A) 04/08/2020 1524   LABSPEC 1.016 04/08/2020 1524   PHURINE 5.0 04/08/2020 1524   GLUCOSEU NEGATIVE 04/08/2020 1524   HGBUR NEGATIVE 04/08/2020 1524   [place] 04/08/2020 1524   KETONESUR NEGATIVE 04/08/2020 1524   PROTEINUR NEGATIVE 04/08/2020 1524   UROBILINOGEN 0.2 08/13/2015 0619   NITRITE NEGATIVE 04/08/2020  1524   LEUKOCYTESUR LARGE (A) 04/08/2020 1524   Sepsis Labs Invalid input(s): PROCALCITONIN,  WBC,  LACTICIDVEN Microbiology Recent Results (from the past 240 hour(s))  Culture, blood (Routine x 2)     Status: None (Preliminary result)   Collection Time: 04/08/20  2:58 PM   Specimen: BLOOD  Result Value Ref Range Status   Specimen Description BLOOD LEFT ANTECUBITAL  Final   Special Requests   Final    BOTTLES DRAWN AEROBIC ONLY Blood Culture adequate volume   Culture   Final    NO GROWTH 4 DAYS Performed at [facility], [place] [address], [place], [place] [address]    Report Status PENDING  Incomplete  Urine culture     Status: Abnormal   Collection Time: 04/08/20  4:33 PM   Specimen: In/Out Cath Urine  Result Value Ref Range Status   Specimen Description IN/OUT CATH URINE  Final   Special Requests   Final    NONE Performed at [facility], [place] [address] [place], [place] [address]    Culture (A)  Final    >=100,000 COLONIES/mL ESCHERICHIA COLI Confirmed Extended Spectrum Beta-Lactamase Producer (ESBL).  In bloodstream infections from ESBL organisms, carbapenems are preferred over piperacillin/tazobactam. They are shown to have a lower risk of mortality.    Report Status 04/12/2020 FINAL  Final   Organism ID, Bacteria ESCHERICHIA COLI (A)  Final      Susceptibility   Escherichia coli - MIC*    AMPICILLIN >=32 RESISTANT Resistant     CEFAZOLIN >=64 RESISTANT Resistant     CEFTRIAXONE >=64 RESISTANT Resistant     CIPROFLOXACIN >=4 RESISTANT Resistant     GENTAMICIN <=1 SENSITIVE Sensitive     IMIPENEM <=0.25 SENSITIVE Sensitive     NITROFURANTOIN <=16 SENSITIVE Sensitive     TRIMETH/SULFA >=320 RESISTANT Resistant     AMPICILLIN/SULBACTAM 8 SENSITIVE Sensitive     PIP/TAZO <=4 SENSITIVE Sensitive     * >=100,000 COLONIES/mL ESCHERICHIA COLI  SARS  Coronavirus 2 by RT PCR (hospital order, performed in [facility] hospital lab) Nasopharyngeal Nasopharyngeal Swab      Status: None   Collection Time: 04/08/20  4:53 PM   Specimen: Nasopharyngeal Swab  Result Value Ref Range Status   SARS Coronavirus 2 NEGATIVE NEGATIVE Final    Comment: (NOTE) SARS-CoV-2 target nucleic acids are NOT DETECTED. The SARS-CoV-2 RNA is generally detectable in upper and lower respiratory specimens during the acute phase of infection. The lowest concentration of SARS-CoV-2 viral copies this assay can detect is 250 copies / mL. A negative result does not preclude SARS-CoV-2 infection and should not be used as the sole basis for treatment or other patient management decisions.  A negative result may occur with improper specimen collection / handling, submission of specimen other than nasopharyngeal swab, presence of viral mutation(s) within the areas targeted by this assay, and inadequate number of viral copies (<250 copies / mL). A negative result must be combined with clinical observations, patient history, and epidemiological information. Fact Sheet for Patients:   StrictlyIdeas.no Fact Sheet for Healthcare Providers: BankingDealers.co.za This test is not yet approved or cleared  by the [place] FDA and has been authorized for detection and/or diagnosis of SARS-CoV-2 by FDA under an Emergency Use Authorization (EUA).  This EUA will remain in effect (meaning this test can be used) for the duration of the COVID-19 declaration under Section 564(b)(1) of the Act, 21 U.S.C. section 360bbb-3(b)(1), unless the authorization is terminated or revoked sooner. Performed at [facility], [place] [address], [place], [place] [address]   Culture, blood (Routine x 2)     Status: None (Preliminary result)   Collection Time: 04/08/20  5:00 PM   Specimen: BLOOD  Result Value Ref Range Status   Specimen Description BLOOD LEFT ANTECUBITAL  Final   Special Requests   Final    BOTTLES DRAWN AEROBIC AND ANAEROBIC Blood Culture results  may not be optimal due to an inadequate volume of blood received in culture bottles   Culture   Final    NO GROWTH 4 DAYS Performed at [facility], [place] [address], [place], [place] [address]    Report Status PENDING  Incomplete  MRSA PCR Screening     Status: None   Collection Time: 04/09/20  3:34 AM   Specimen: Nasopharyngeal  Result Value Ref Range Status   MRSA by PCR NEGATIVE NEGATIVE Final    Comment:        The GeneXpert MRSA Assay (FDA approved for NASAL specimens only), is one component of a comprehensive MRSA colonization surveillance program. It is not intended to diagnose MRSA infection nor to guide or monitor treatment for MRSA infections. Performed at [facility], [place] [address], [place], [place] [address]   Culture, blood (routine x 2)     Status: None (Preliminary result)   Collection Time: 04/11/20 10:37 AM   Specimen: BLOOD  Result Value Ref Range Status   Specimen Description BLOOD RIGHT ANTECUBITAL  Final   Special Requests   Final    BOTTLES DRAWN AEROBIC AND ANAEROBIC Blood Culture adequate volume   Culture   Final    NO GROWTH 1 DAY Performed at [facility], [place] [address] [place], [place] [address]    Report Status PENDING  Incomplete  Culture, blood (routine x 2)     Status: None (Preliminary result)   Collection Time: 04/11/20 10:39 AM   Specimen: BLOOD RIGHT HAND  Result  Value Ref Range Status   Specimen Description BLOOD RIGHT HAND  Final   Special Requests   Final    BOTTLES DRAWN AEROBIC AND ANAEROBIC Blood Culture adequate volume   Culture   Final    NO GROWTH 1 DAY Performed at [facility], [address] [place], [place] [address]    Report Status PENDING  Incomplete    Procedures/Studies: DG Chest 2 View  Result Date: 04/08/2020 CLINICAL DATA:  Urinary tract infection, sepsis, flank pain, chills, dysuria EXAM: CHEST - 2 VIEW COMPARISON:  12/21/2019 FINDINGS: Frontal and lateral views of the chest  demonstrate an unremarkable cardiac silhouette. No airspace disease, effusion, or pneumothorax. No acute bony abnormalities. IMPRESSION: 1. No acute intrathoracic process. Electronically Signed   By: Randa Ngo M.D.   On: 04/08/2020 15:49   US RENAL  Result Date: 04/09/2020 CLINICAL DATA:  Elevated creatinine EXAM: RENAL / URINARY TRACT ULTRASOUND COMPLETE COMPARISON:  CT 12/21/2019 FINDINGS: Right Kidney: Renal measurements: 11.3 x 5.3 x 6.8 cm = volume: 212.4 mL . Echogenicity within normal limits. No mass or hydronephrosis visualized. Left Kidney: Renal measurements: 11.2 x 6.2 x 6.8 cm = volume: 247.3 mL. Echogenicity within normal limits. No mass or hydronephrosis visualized. Bladder: Appears normal for degree of bladder distention. Other: Lobulated enlargement of the prostate measuring 6.4 x 5.8 x 5.9 cm with volume of 115.5 cm. Mass effect on the bladder. IMPRESSION: 1. Negative ultrasound appearance of the kidneys. 2. Irregular enlargement of the prostate with mass effect on the bladder. Electronically Signed   By: Donavan Foil M.D.   On: 04/09/2020 17:35   ECHOCARDIOGRAM COMPLETE  Result Date: 04/11/2020    ECHOCARDIOGRAM REPORT   Patient Name:   Craig Butler [facility] Date of Exam: 04/11/2020 Medical Rec #:  [id number]        Height:       70.0 in Accession #:    [id number]       Weight:       213.0 lb Date of Birth:  22-Jan-1956        BSA:          2.144 m Patient Age:    45 years         BP:           136/79 mmHg Patient Gender: M                HR:           76 bpm. Exam Location:  Inpatient Procedure: 2D Echo Indications:    Atrial Fibrillation 427.31 / I48.91  History:        Patient has no prior history of Echocardiogram examinations.                 Risk Factors:Former Smoker. GERD, History of ESBL E. coli                 infection, Sepsis.  Sonographer:    Leavy Cella Referring Phys: [id number] [place]  1. Normal LV systolic function; mild LVH.  2. Left ventricular ejection  fraction, by estimation, is 60 to 65%. The left ventricle has normal function. The left ventricle has no regional wall motion abnormalities. There is mild left ventricular hypertrophy. Left ventricular diastolic parameters were normal.  3. Right ventricular systolic function is normal. The right ventricular size is normal.  4. The mitral valve is normal in structure. Trivial mitral valve regurgitation. No evidence of mitral stenosis.  5.  The aortic valve is tricuspid. Aortic valve regurgitation is not visualized. No aortic stenosis is present.  6. The inferior vena cava is dilated in size with >50% respiratory variability, suggesting right atrial pressure of 8 mmHg. FINDINGS  Left Ventricle: Left ventricular ejection fraction, by estimation, is 60 to 65%. The left ventricle has normal function. The left ventricle has no regional wall motion abnormalities. The left ventricular internal cavity size was normal in size. There is  mild left ventricular hypertrophy. Left ventricular diastolic parameters were normal. Right Ventricle: The right ventricular size is normal.Right ventricular systolic function is normal. Left Atrium: Left atrial size was normal in size. Right Atrium: Right atrial size was normal in size. Pericardium: There is no evidence of pericardial effusion. Mitral Valve: The mitral valve is normal in structure. Normal mobility of the mitral valve leaflets. Trivial mitral valve regurgitation. No evidence of mitral valve stenosis. Tricuspid Valve: The tricuspid valve is normal in structure. Tricuspid valve regurgitation is trivial. No evidence of tricuspid stenosis. Aortic Valve: The aortic valve is tricuspid. Aortic valve regurgitation is not visualized. No aortic stenosis is present. Pulmonic Valve: The pulmonic valve was not well visualized. Pulmonic valve regurgitation is not visualized. No evidence of pulmonic stenosis. Aorta: The aortic root is normal in size and structure. Venous: The inferior vena  cava is dilated in size with greater than 50% respiratory variability, suggesting right atrial pressure of 8 mmHg. IAS/Shunts: No atrial level shunt detected by color flow Doppler. Additional Comments: Normal LV systolic function; mild LVH.  LEFT VENTRICLE PLAX 2D LVIDd:         3.69 cm  Diastology LVIDs:         3.07 cm  LV e' lateral:   11.80 cm/s LV PW:         1.29 cm  LV E/e' lateral: 7.7 LV IVS:        1.35 cm  LV e' medial:    9.57 cm/s LVOT diam:     1.90 cm  LV E/e' medial:  9.5 LVOT Area:     2.84 cm  RIGHT VENTRICLE RV S prime:     20.50 cm/s TAPSE (M-mode): 2.8 cm LEFT ATRIUM             Index       RIGHT ATRIUM           Index LA diam:        3.70 cm 1.73 cm/m  RA Area:     18.10 cm LA Vol (A2C):   61.7 ml 28.78 ml/m RA Volume:   56.00 ml  26.12 ml/m LA Vol (A4C):   40.9 ml 19.08 ml/m LA Biplane Vol: 51.1 ml 23.84 ml/m   AORTA Ao Root diam: 3.00 cm MITRAL VALVE MV Area (PHT): 4.21 cm    SHUNTS MV Decel Time: 180 msec    Systemic Diam: 1.90 cm MV E velocity: 90.80 cm/s MV A velocity: 49.30 cm/s MV E/A ratio:  1.84 Kirk Ruths MD Electronically signed by Kirk Ruths MD Signature Date/Time: 04/11/2020/10:21:39 AM    Final    [place] EKG SITE RITE  Result Date: 04/12/2020 If Site Rite image not attached, placement could not be confirmed due to current cardiac rhythm.    Time coordinating discharge: Over 30 minutes  SIGNED:   Guilford Shi, MD  [facility] 04/13/2020, 8:22 AM

## 2020-04-13 NOTE — Progress Notes (Signed)
ID Pharmacy Note  Adding one time dose of ertapenem 1 gm q 24 hours for this afternoon prior to discharge to allow for transition to home with home health. Mr. Imming will receive his first infusion at home tomorrow.    Jimmy Footman, PharmD, BCPS, BCIDP Infectious Diseases Clinical Pharmacist Phone: [phone] 04/13/2020 9:53 AM

## 2020-04-13 NOTE — Plan of Care (Signed)

## 2020-04-13 NOTE — TOC Initial Note (Signed)
Transition of Care [facility]) - Initial/Assessment Note    Patient Details  Name: Craig Butler MRN: [id number] Date of Birth: June 18, 1956  Transition of Care [facility]) CM/SW Contact:    Curlene Labrum, RN Phone Number: 04/13/2020, 1:48 PM  Clinical Narrative:                 Case management met with the patient.  Patient will need IV antibiotics and RN home health for IV infusion teaching and PICC line maintenance and dressing changes. Patient lives at home with his wife.  Patient has had a PICC in the past and has managed his own IV infusions with home care agency assistance.  The patient does not remember his previous company for home health.  I offered medicare choice regarding home health and IV infusion company and patient does not have a preference.  I spoke with Carolynn Sayers, IV infusion Case manager with [employer] and she will see the patient prior to discharge today for teaching.  I called [facility] for [facility] services and waiting on return call for possible acceptance.  Patient with no other needs - will continue to follow for needs for discharge.  Expected Discharge Plan: [place] Barriers to Discharge: (Waiting on IV RN to follow for teaching - Carolynn Sayers, [facility])   Patient Goals and CMS Choice Patient states their goals for this hospitalization and ongoing recovery are:: I'm ready to go home. CMS Medicare.gov Compare Post Acute Care list provided to:: Patient Choice offered to / list presented to : Patient  Expected Discharge Plan and Services Expected Discharge Plan: [place]   Discharge Planning Services: CM Consult Post Acute Care Choice: Home Health   Expected Discharge Date: 04/13/20                         HH Arranged: RN, IV Antibiotics HH Agency: [employer] Date HH Agency Contacted: 04/13/20 Time [place]: 1347 Representative spoke with at [place]: Tommi Rumps with [facility] Lemmings  Prior Living Arrangements/Services   Lives  with:: Spouse Patient language and need for interpreter reviewed:: Yes Do you feel safe going back to the place where you live?: Yes      Need for Family Participation in Patient Care: Yes (Comment) Care giver support system in place?: Yes (comment)   Criminal Activity/Legal Involvement Pertinent to Current Situation/Hospitalization: No - Comment as needed  Activities of Daily Living Home Assistive Devices/Equipment: None ADL Screening (condition at time of admission) Patient's cognitive ability adequate to safely complete daily activities?: Yes Is the patient deaf or have difficulty hearing?: No Does the patient have difficulty seeing, even when wearing glasses/contacts?: No Does the patient have difficulty concentrating, remembering, or making decisions?: No Patient able to express need for assistance with ADLs?: Yes Does the patient have difficulty dressing or bathing?: No Independently performs ADLs?: Yes (appropriate for developmental age) Does the patient have difficulty walking or climbing stairs?: No Weakness of Legs: None Weakness of Arms/Hands: None  Permission Sought/Granted Permission sought to share information with : Case Manager Permission granted to share information with : Yes, Verbal Permission Granted     Permission granted to share info w AGENCY: [employer] - Carolynn Sayers and Meredeth Ide        Emotional Assessment Appearance:: Appears stated age Attitude/Demeanor/Rapport: Gracious Affect (typically observed): Accepting Orientation: : Oriented to Self, Oriented to Place, Oriented to  Time, Oriented to Situation Alcohol / Substance Use:  Not Applicable Psych Involvement: No (comment)  Admission diagnosis:  UTI (urinary tract infection) [N39.0] Urinary tract infection without hematuria, site unspecified [N39.0] Sepsis, due to unspecified organism, unspecified whether acute organ dysfunction present [facility]) [A41.9] Patient Active Problem List   Diagnosis Date  Noted  . UTI (urinary tract infection) 04/08/2020  . Sepsis due to gram-negative UTI ([place])  04/08/2020  . Prostate cancer ([place]) 12/21/2019  . Obesity (BMI 30.0-34.9) 12/21/2019  . Recurrent UTI 11/09/2015  . History of ESBL E. coli infection 11/09/2015  . Acute urinary retention 07/26/2015  . Hematuria 07/26/2015  . Constipation 07/26/2015  . Sepsis ([place]) 07/24/2015  . Lower urinary tract infectious disease 07/24/2015  . AKI (acute kidney injury) ([place]) 07/24/2015  . GERD (gastroesophageal reflux disease)   . Migraine   . Depression   . Anxiety   . H/O prostate biopsy    PCP:  Lawerance Cruel, MD Pharmacy:   [id number] [id number]: [phone]: [phone]    Social Determinants of Health ([place] Interventions    Readmission Risk Interventions Readmission Risk Prevention Plan 04/13/2020  Transportation Screening Complete  PCP or Specialist Appt within 5-7 Days Complete  Home Care Screening Complete  Medication Review (RN CM) Complete  Some recent data might be hidden

## 2020-04-13 NOTE — Progress Notes (Signed)
The patient was updated on discharge process and that the case manager would be in touch with him regarding home health and IV antibiotics

## 2020-04-16 LAB — CULTURE, BLOOD (ROUTINE X 2)
Culture: NO GROWTH
Culture: NO GROWTH
Special Requests: ADEQUATE
Special Requests: ADEQUATE

## 2020-05-03 ENCOUNTER — Telehealth: Payer: Self-pay

## 2020-05-03 NOTE — Telephone Encounter (Signed)
Patient called triage and requested office contact his surgeon prior to his prostate surgery for antibiotic recommendations. Per chart, patient hasn't been seen by provider for 3 years. Was hospitalized and sent home with IV abx that were dosed with pharmacy. Patient also requested medication refill that was previously filled by his PCP. Left vm requesting call back for more information.   Stellarose Cerny Lorita Officer, RN

## 2020-05-17 NOTE — Progress Notes (Signed)
Cardiology Office Note:   Date:  05/18/2020  NAME:  Craig Butler    MRN: [id number] DOB:  1955/12/15   PCP:  Lawerance Cruel, MD  Cardiologist:  No primary care provider on file.  Electrophysiologist:  None   Referring MD: Lawerance Cruel, MD   Chief Complaint  Patient presents with  . Atrial Fibrillation   History of Present Illness:   Craig Butler is a 64 y.o. male with a hx of paroxysmal Afib, prostate CA, and recurrent UTI who presents for follow-up of atrial fibrillation. Was evaluated in the hospital in late May for episode of Afib in setting of sepsis. He spontaneously converted back to NSR without intervention. A brief course of amiodarone was recommended.   His EKG today shows normal sinus rhythm.  Has had no further recurrence of his A. fib.  He does report at night he can feel a bit funny.  He reports when he lays down he feels his body is quite fatigued.  He reports that he may feel his heart beating a little fast.  He states he feels as if he has had atrial fibrillation again.  No documented recurrence that I can tell.  He reports he does get infrequent episodes of chest pain.  They occur on a monthly basis.  He reports while driving he can feel sharp pain that last for minutes in the center of his chest.  It is nonradiating and not alleviated by any other factors he has noticed.  He has noticed no identifiable trigger.  Exertion does not make it worse.  It simply occurs while he is resting and goes away.  He reports he had a stress test a number of years ago but is not had one since.  He apparently will have prostate cancer surgery at [facility] in late August.  He reports he is frequently tired at night after working.  He reports that he does snore.  He reports he does not sleep well.  We did discuss a sleep study and he is interested.  Problem List 1. Prostate CA 2. Recurrent UTI 3. Paroxysmal Afib -5/29 in setting of sepsis and spontaneously converted back to  NSR  Past Medical History: Past Medical History:  Diagnosis Date  . Acid reflux   . Anxiety   . BPH (benign prostatic hyperplasia)   . Depression   . Frequent UTI   . Migraine   . Prostate cancer [facility])     Past Surgical History: Past Surgical History:  Procedure Laterality Date  . BIOPSY PROSTATE      Current Medications: Current Meds  Medication Sig  . amiodarone (PACERONE) 200 MG tablet Take 1 tablet (200 mg total) by mouth daily. Start on 04/17/2020 after loading dose completed  . Ascorbic Acid (VITAMIN C) 1000 MG tablet Take 1,000 mg by mouth daily.  . Cyanocobalamin (VITAMIN B 12 PO) Take 1 capsule by mouth daily.  . Multiple Vitamin (MULTIVITAMIN WITH MINERALS) TABS tablet Take 1 tablet by mouth daily.  . nitrofurantoin (MACRODANTIN) 50 MG capsule Take 50 mg by mouth at bedtime.  Marland Kitchen omeprazole (PRILOSEC) 20 MG capsule Take 20 mg by mouth daily.  . ondansetron (ZOFRAN) 4 MG tablet Take 1 tablet (4 mg total) by mouth every 6 (six) hours as needed for nausea.  . [DISCONTINUED] flavoxATE (URISPAS) 100 MG tablet Take 1 tablet (100 mg total) by mouth 3 (three) times daily as needed for bladder spasms.  . [DISCONTINUED] fluticasone (FLONASE) 50 MCG/ACT nasal  spray Place 1 spray into both nostrils daily as needed for allergies or rhinitis.  . [DISCONTINUED] saw palmetto 500 MG capsule Take 500 mg by mouth daily.  . [DISCONTINUED] tamsulosin (FLOMAX) 0.4 MG CAPS capsule Take 1 capsule (0.4 mg total) by mouth daily.     Allergies:    Amoxicillin   Social History: Social History   Socioeconomic History  . Marital status: Married    Spouse name: Not on file  . Number of children: Not on file  . Years of education: Not on file  . Highest education level: Not on file  Occupational History  . Not on file  Tobacco Use  . Smoking status: Former Smoker    Years: 10.00  . Smokeless tobacco: Never Used  Vaping Use  . Vaping Use: Never used  Substance and Sexual Activity  .  Alcohol use: Not Currently  . Drug use: Never  . Sexual activity: Not on file  Other Topics Concern  . Not on file  Social History Narrative  . Not on file   Social Determinants of Health   Financial Resource Strain:   . Difficulty of Paying Living Expenses:   Food Insecurity:   . Worried About Charity fundraiser in the Last Year:   . Arboriculturist in the Last Year:   Transportation Needs:   . Film/video editor (Medical):   Marland Kitchen Lack of Transportation (Non-Medical):   Physical Activity:   . Days of Exercise per Week:   . Minutes of Exercise per Session:   Stress:   . Feeling of Stress :   Social Connections:   . Frequency of Communication with Friends and Family:   . Frequency of Social Gatherings with Friends and Family:   . Attends Religious Services:   . Active Member of Clubs or Organizations:   . Attends Archivist Meetings:   Marland Kitchen Marital Status:      Family History: The patient's family history includes Cancer in his brother and father.  ROS:   All other ROS reviewed and negative. Pertinent positives noted in the HPI.     EKGs/Labs/Other Studies Reviewed:   The following studies were personally reviewed by me today:  EKG:  EKG is ordered today.  The ekg ordered today demonstrates normal sinus rhythm, heart rate 60, no acute ST-T changes, no evidence of prior infarction, and was personally reviewed by me.   TTE 04/11/2020  1. Normal LV systolic function; mild LVH.  2. Left ventricular ejection fraction, by estimation, is 60 to 65%. The  left ventricle has normal function. The left ventricle has no regional  wall motion abnormalities. There is mild left ventricular hypertrophy.  Left ventricular diastolic parameters  were normal.  3. Right ventricular systolic function is normal. The right ventricular  size is normal.  4. The mitral valve is normal in structure. Trivial mitral valve  regurgitation. No evidence of mitral stenosis.  5. The  aortic valve is tricuspid. Aortic valve regurgitation is not  visualized. No aortic stenosis is present.  6. The inferior vena cava is dilated in size with >50% respiratory  variability, suggesting right atrial pressure of 8 mmHg.   Recent Labs: 04/10/2020: ALT 17 04/11/2020: TSH 2.390 04/12/2020: BUN 10; Creatinine, Ser 1.03; Hemoglobin 12.1; Magnesium 1.7; Platelets 156; Potassium 3.7; Sodium 138   Recent Lipid Panel No results found for: CHOL, TRIG, HDL, CHOLHDL, VLDL, LDLCALC, LDLDIRECT  Physical Exam:   VS:  BP 114/76   Pulse  60   Ht 5\' 10"  (1.778 m)   Wt 234 lb (106.1 kg)   SpO2 94%   BMI 33.58 kg/m    Wt Readings from Last 3 Encounters:  05/18/20 234 lb (106.1 kg)  04/09/20 212 lb 15.4 oz (96.6 kg)  12/21/19 230 lb (104.3 kg)    General: Well nourished, well developed, in no acute distress Heart: Atraumatic, normal size  Eyes: PEERLA, EOMI  Neck: Supple, no JVD Endocrine: No thryomegaly Cardiac: Normal S1, S2; RRR; no murmurs, rubs, or gallops Lungs: Clear to auscultation bilaterally, no wheezing, rhonchi or rales  Abd: Soft, nontender, no hepatomegaly  Ext: No edema, pulses 2+ Musculoskeletal: No deformities, BUE and BLE strength normal and equal Skin: Warm and dry, no rashes   Neuro: Alert and oriented to person, place, time, and situation, CNII-XII grossly intact, no focal deficits  Psych: Normal mood and affect   ASSESSMENT:   AERION BAGDASARIAN is a 64 y.o. male who presents for the following: 1. Paroxysmal atrial fibrillation (HCC)   2. Palpitations   3. Chest pain of uncertain etiology   4. Chronic fatigue   5. Snoring     PLAN:   1. Paroxysmal atrial fibrillation (HCC) -Atrial fibrillation diagnosed in late May 2021 in setting of sepsis.  He did complete a brief course of amiodarone.  He is in normal sinus rhythm today.  Echocardiogram was normal.  Thyroid studies are normal.  We will stop amiodarone today.  2. Palpitations -He does have intermittent  episodes of palpitations at night.  Associated with fatigue.  We will proceed with a 7-day Zio patch to exclude any recurrence of atrial fibrillation.  3. Chest pain of uncertain etiology -Atypical chest pain.  EKG today without acute ischemic change or evidence of prior infarction.  He had a normal echocardiogram in the hospital.  He will have surgery soon.  We will proceed with a Lexiscan nuclear medicine stress test to exclude effective CAD.  4. Chronic fatigue 5. Snoring -Home sleep study   Disposition: Return in about 6 weeks (around 06/29/2020).  Medication Adjustments/Labs and Tests Ordered: Current medicines are reviewed at length with the patient today.  Concerns regarding medicines are outlined above.  Orders Placed This Encounter  Procedures  . MYOCARDIAL PERFUSION IMAGING  . LONG TERM MONITOR (3-14 DAYS)  . EKG 12-Lead  . Home sleep test   No orders of the defined types were placed in this encounter.   Patient Instructions  Medication Instructions:  The current medical regimen is effective;  continue present plan and medications.  *If you need a refill on your cardiac medications before your next appointment, please call your pharmacy*   Testing/Procedures: Your physician has requested that you have a lexiscan myoview (at [address], per Dr.O'Neal). A cardiac stress test is a cardiological test that measures the heart's ability to respond to external stress in a controlled clinical environment. The stress response is induced by intravenous pharmacological stimulation.  Your physician has recommended that you have a sleep study. This test records several body functions during sleep, including: brain activity, eye movement, oxygen and carbon dioxide blood levels, heart rate and rhythm, breathing rate and rhythm, the flow of air through your mouth and nose, snoring, body muscle movements, and chest and belly movement.  Your physician has recommended that you wear a 7  DAY ZIO-PATCH monitor. The Zio patch cardiac monitor continuously records heart rhythm data for up to 14 days, this is for patients being evaluated for  multiple types heart rhythms. For the first 24 hours post application, please avoid getting the Zio monitor wet in the shower or by excessive sweating during exercise. After that, feel free to carry on with regular activities. Keep soaps and lotions away from the ZIO XT Patch.  This will be mailed to you, please expect 7-10 days to receive.         Follow-Up: At [facility], you and your health needs are our priority.  As part of our continuing mission to provide you with exceptional heart care, we have created designated Provider Care Teams.  These Care Teams include your primary Cardiologist (physician) and Advanced Practice Providers (APPs -  Physician Assistants and Nurse Practitioners) who all work together to provide you with the care you need, when you need it.  We recommend signing up for the patient portal called "MyChart".  Sign up information is provided on this After Visit Summary.  MyChart is used to connect with patients for Virtual Visits (Telemedicine).  Patients are able to view lab/test results, encounter notes, upcoming appointments, etc.  Non-urgent messages can be sent to your provider as well.   To learn more about what you can do with MyChart, go to [email or web address].    Your next appointment:   6 week(s)  The format for your next appointment:   In Person  Provider:   Eleonore Chiquito, MD         Signed, Addison Naegeli. Audie Box, [place]  [address], [place], [place] [address] [phone]  05/18/2020 10:38 AM

## 2020-05-18 ENCOUNTER — Telehealth: Payer: Self-pay | Admitting: Radiology

## 2020-05-18 ENCOUNTER — Encounter: Payer: Self-pay | Admitting: Cardiovascular Disease

## 2020-05-18 ENCOUNTER — Ambulatory Visit (INDEPENDENT_AMBULATORY_CARE_PROVIDER_SITE_OTHER): Payer: 59 | Admitting: Cardiovascular Disease

## 2020-05-18 ENCOUNTER — Other Ambulatory Visit: Payer: Self-pay

## 2020-05-18 VITALS — BP 114/76 | HR 60 | Ht 70.0 in | Wt 234.0 lb

## 2020-05-18 DIAGNOSIS — R079 Chest pain, unspecified: Secondary | ICD-10-CM

## 2020-05-18 DIAGNOSIS — R002 Palpitations: Secondary | ICD-10-CM | POA: Diagnosis not present

## 2020-05-18 DIAGNOSIS — R5382 Chronic fatigue, unspecified: Secondary | ICD-10-CM

## 2020-05-18 DIAGNOSIS — I480 Paroxysmal atrial fibrillation: Secondary | ICD-10-CM

## 2020-05-18 DIAGNOSIS — R0683 Snoring: Secondary | ICD-10-CM

## 2020-05-18 NOTE — Telephone Encounter (Signed)
Enrolled patient for a 7 day Zio monitor to be mailed to [address], [place] [address] per patients request.

## 2020-05-18 NOTE — Patient Instructions (Addendum)
Medication Instructions:  The current medical regimen is effective;  continue present plan and medications.  *If you need a refill on your cardiac medications before your next appointment, please call your pharmacy*   Testing/Procedures: Your physician has requested that you have a lexiscan myoview (at [address], per Dr.O'Neal). A cardiac stress test is a cardiological test that measures the heart's ability to respond to external stress in a controlled clinical environment. The stress response is induced by intravenous pharmacological stimulation.  Your physician has recommended that you have a sleep study. This test records several body functions during sleep, including: brain activity, eye movement, oxygen and carbon dioxide blood levels, heart rate and rhythm, breathing rate and rhythm, the flow of air through your mouth and nose, snoring, body muscle movements, and chest and belly movement.  Your physician has recommended that you wear a 7 DAY ZIO-PATCH monitor. The Zio patch cardiac monitor continuously records heart rhythm data for up to 14 days, this is for patients being evaluated for multiple types heart rhythms. For the first 24 hours post application, please avoid getting the Zio monitor wet in the shower or by excessive sweating during exercise. After that, feel free to carry on with regular activities. Keep soaps and lotions away from the ZIO XT Patch.  This will be mailed to you, please expect 7-10 days to receive.         Follow-Up: At [facility], you and your health needs are our priority.  As part of our continuing mission to provide you with exceptional heart care, we have created designated Provider Care Teams.  These Care Teams include your primary Cardiologist (physician) and Advanced Practice Providers (APPs -  Physician Assistants and Nurse Practitioners) who all work together to provide you with the care you need, when you need it.  We recommend signing up for  the patient portal called "MyChart".  Sign up information is provided on this After Visit Summary.  MyChart is used to connect with patients for Virtual Visits (Telemedicine).  Patients are able to view lab/test results, encounter notes, upcoming appointments, etc.  Non-urgent messages can be sent to your provider as well.   To learn more about what you can do with MyChart, go to [email or web address].    Your next appointment:   6 week(s)  The format for your next appointment:   In Person  Provider:   Eleonore Chiquito, MD

## 2020-05-20 ENCOUNTER — Telehealth (HOSPITAL_COMMUNITY): Payer: Self-pay | Admitting: *Deleted

## 2020-05-20 ENCOUNTER — Encounter (HOSPITAL_COMMUNITY): Payer: Self-pay | Admitting: *Deleted

## 2020-05-20 NOTE — Telephone Encounter (Signed)
Instruction letter for stress test sent via My Chart. Craig Butler

## 2020-05-24 ENCOUNTER — Other Ambulatory Visit: Payer: Self-pay

## 2020-05-24 ENCOUNTER — Ambulatory Visit (HOSPITAL_COMMUNITY): Payer: 59 | Attending: Internal Medicine

## 2020-05-24 DIAGNOSIS — R079 Chest pain, unspecified: Secondary | ICD-10-CM | POA: Insufficient documentation

## 2020-05-24 MED ORDER — REGADENOSON 0.4 MG/5ML IV SOLN
0.4000 mg | Freq: Once | INTRAVENOUS | Status: AC
  Administered 2020-05-24: 0.4 mg via INTRAVENOUS

## 2020-05-24 MED ORDER — TECHNETIUM TC 99M TETROFOSMIN IV KIT
10.2000 | PACK | Freq: Once | INTRAVENOUS | Status: AC | PRN
  Administered 2020-05-24: 10.2 via INTRAVENOUS
  Filled 2020-05-24: qty 11

## 2020-05-24 MED ORDER — TECHNETIUM TC 99M TETROFOSMIN IV KIT
32.2000 | PACK | Freq: Once | INTRAVENOUS | Status: AC | PRN
  Administered 2020-05-24: 32.2 via INTRAVENOUS
  Filled 2020-05-24: qty 33

## 2020-05-25 ENCOUNTER — Other Ambulatory Visit (INDEPENDENT_AMBULATORY_CARE_PROVIDER_SITE_OTHER): Payer: 59

## 2020-05-25 DIAGNOSIS — R002 Palpitations: Secondary | ICD-10-CM

## 2020-05-25 LAB — MYOCARDIAL PERFUSION IMAGING
LV dias vol: 124 mL (ref 62–150)
LV sys vol: 52 mL
Peak HR: 85 {beats}/min
Rest HR: 59 {beats}/min
SDS: 4
SRS: 0
SSS: 4
TID: 1.33

## 2020-06-04 ENCOUNTER — Telehealth: Payer: Self-pay | Admitting: Cardiovascular Disease

## 2020-06-04 NOTE — Telephone Encounter (Signed)
Patient calling stating he is supposed to have a sleep study, but has not heard anything about scheduling it.

## 2020-06-10 NOTE — Telephone Encounter (Signed)
Patient notified of [place] appointment.

## 2020-06-23 ENCOUNTER — Other Ambulatory Visit: Payer: Self-pay

## 2020-06-23 MED ORDER — METOPROLOL SUCCINATE ER 25 MG PO TB24
25.0000 mg | ORAL_TABLET | Freq: Every day | ORAL | 3 refills | Status: DC
Start: 2020-06-23 — End: 2020-07-05

## 2020-07-04 NOTE — Progress Notes (Signed)
Cardiology Office Note:   Date:  07/05/2020  NAME:  Craig Butler    MRN: [id number] DOB:  1956-09-01   PCP:  Lawerance Cruel, MD  Cardiologist:  No primary care provider on file.   Referring MD: Lawerance Cruel, MD   Chief Complaint  Patient presents with  . atrial tachycardia   History of Present Illness:   Craig Butler is a 64 y.o. male with a hx of paroxysmal Afib (03/2020 in setting of sepsis), A tach, prostate CA who presents for follow-up. Was evaluated after being admitted for sepsis and UTI where he was diagnosed with paroxysmal Afib. Zio without Afib and a tach. Started on metoprolol. NM Stress normal. Home sleep study pending. He reports has been fatigue since starting metoprolol.  He had no further episodes of palpitations.  He reports infrequent chest pain.  Apparently the episodes are less than they were.  He will have prostate surgery at [facility] later this week.  He does report a 1 time episode of double vision.  He does suffer from vertigo.  No further episodes.  He reports he is exercising on an exercise bike up to 20 to 30 minutes/day.  Note symptoms such as chest pain, shortness of breath or palpitations.  Overall, he seems to be doing well. We did discuss holding metoprolol to see if he has further recurrence of palpitations.  Given that he had 5 episodes of atrial tachycardia in 1 week this may be reasonable.  Problem List 1. Prostate CA 2. Recurrent UTI 3. Paroxysmal Afib -5/29 in setting of sepsis and spontaneously converted back to NSR 4. Atrial tachycardia -detected on 03/2020  Past Medical History: Past Medical History:  Diagnosis Date  . Acid reflux   . Anxiety   . BPH (benign prostatic hyperplasia)   . Depression   . Frequent UTI   . Migraine   . Prostate cancer [facility])     Past Surgical History: Past Surgical History:  Procedure Laterality Date  . BIOPSY PROSTATE      Current Medications: No outpatient medications have been marked as  taking for the 07/05/20 encounter (Office Visit) with Geralynn Rile, MD.     Allergies:    Amoxicillin   Social History: Social History   Socioeconomic History  . Marital status: Married    Spouse name: Not on file  . Number of children: Not on file  . Years of education: Not on file  . Highest education level: Not on file  Occupational History  . Not on file  Tobacco Use  . Smoking status: Former Smoker    Years: 10.00  . Smokeless tobacco: Never Used  Vaping Use  . Vaping Use: Never used  Substance and Sexual Activity  . Alcohol use: Not Currently  . Drug use: Never  . Sexual activity: Not on file  Other Topics Concern  . Not on file  Social History Narrative  . Not on file   Social Determinants of Health   Financial Resource Strain:   . Difficulty of Paying Living Expenses: Not on file  Food Insecurity:   . Worried About Charity fundraiser in the Last Year: Not on file  . Ran Out of Food in the Last Year: Not on file  Transportation Needs:   . Lack of Transportation (Medical): Not on file  . Lack of Transportation (Non-Medical): Not on file  Physical Activity:   . Days of Exercise per Week: Not on file  .  Minutes of Exercise per Session: Not on file  Stress:   . Feeling of Stress : Not on file  Social Connections:   . Frequency of Communication with Friends and Family: Not on file  . Frequency of Social Gatherings with Friends and Family: Not on file  . Attends Religious Services: Not on file  . Active Member of Clubs or Organizations: Not on file  . Attends Archivist Meetings: Not on file  . Marital Status: Not on file     Family History: The patient's family history includes Cancer in his brother and father.  ROS:   All other ROS reviewed and negative. Pertinent positives noted in the HPI.     EKGs/Labs/Other Studies Reviewed:   The following studies were personally reviewed by me today:  NM STress 05/25/2020  The left  ventricular ejection fraction is normal (55-65%).  Nuclear stress EF: 59%.  There was no ST segment deviation noted during stress.  Defect 1: There is a large defect of moderate severity present in the basal inferior, mid inferior and apex location.  This is a low risk study.   Low risk, mildly abnormal stress nuclear study with thinning of the inferior wall and apex versus small prior infarct.  No ischemia.  Gated ejection fraction 59% with normal wall motion.  Zio 06/23/2020 1. Frequent ectopic atrial tachycardia episodes were detected. Symptoms reported mainly with normal sinus rhythm. 2. No atrial fibrillation or flutter.  3. Rare ectopy.   TTE 04/11/2020 1. Normal LV systolic function; mild LVH.  2. Left ventricular ejection fraction, by estimation, is 60 to 65%. The  left ventricle has normal function. The left ventricle has no regional  wall motion abnormalities. There is mild left ventricular hypertrophy.  Left ventricular diastolic parameters  were normal.  3. Right ventricular systolic function is normal. The right ventricular  size is normal.  4. The mitral valve is normal in structure. Trivial mitral valve  regurgitation. No evidence of mitral stenosis.  5. The aortic valve is tricuspid. Aortic valve regurgitation is not  visualized. No aortic stenosis is present.  6. The inferior vena cava is dilated in size with >50% respiratory  variability, suggesting right atrial pressure of 8 mmHg.   Recent Labs: 04/10/2020: ALT 17 04/11/2020: TSH 2.390 04/12/2020: BUN 10; Creatinine, Ser 1.03; Hemoglobin 12.1; Magnesium 1.7; Platelets 156; Potassium 3.7; Sodium 138   Recent Lipid Panel No results found for: CHOL, TRIG, HDL, CHOLHDL, VLDL, LDLCALC, LDLDIRECT  Physical Exam:   VS:  BP 116/70   Pulse 61   Ht 5\' 10"  (1.778 m)   Wt 234 lb (106.1 kg)   SpO2 96%   BMI 33.58 kg/m    Wt Readings from Last 3 Encounters:  07/05/20 234 lb (106.1 kg)  05/24/20 234 lb (106.1  kg)  05/18/20 234 lb (106.1 kg)    General: Well nourished, well developed, in no acute distress Heart: Atraumatic, normal size  Eyes: PEERLA, EOMI  Neck: Supple, no JVD Endocrine: No thryomegaly Cardiac: Normal S1, S2; RRR; no murmurs, rubs, or gallops Lungs: Clear to auscultation bilaterally, no wheezing, rhonchi or rales  Abd: Soft, nontender, no hepatomegaly  Ext: No edema, pulses 2+ Musculoskeletal: No deformities, BUE and BLE strength normal and equal Skin: Warm and dry, no rashes   Neuro: Alert and oriented to person, place, time, and situation, CNII-XII grossly intact, no focal deficits  Psych: Normal mood and affect   ASSESSMENT:   Craig Butler is a 64  y.o. male who presents for the following: 1. Paroxysmal atrial fibrillation (HCC)   2. Atrial tachycardia ([place])     PLAN:   1. Paroxysmal atrial fibrillation (HCC) -Diagnosed with paroxysmal A. fib in the setting of sepsis secondary to UTI.  No further recurrence on Zio patch. -Echo normal -Stress test normal  2. Atrial tachycardia ([place]) -He did have frequent ectopic atrial tachycardia episodes.  We did place him on metoprolol but he reports fatigue with this medication.  No further recurrence of palpitations.  I think it is reasonable to see if they come back.  We will hold his metoprolol for now.  He will let us know if he has further recurrence of episodes.   Disposition: Return in about 6 months (around 01/05/2021).  Medication Adjustments/Labs and Tests Ordered: Current medicines are reviewed at length with the patient today.  Concerns regarding medicines are outlined above.  No orders of the defined types were placed in this encounter.  No orders of the defined types were placed in this encounter.   Patient Instructions  Medication Instructions:  Stop Metoprolol - let us know how you do when you stop this.   *If you need a refill on your cardiac medications before your next appointment, please call your  pharmacy*   Follow-Up: At [facility], you and your health needs are our priority.  As part of our continuing mission to provide you with exceptional heart care, we have created designated Provider Care Teams.  These Care Teams include your primary Cardiologist (physician) and Advanced Practice Providers (APPs -  Physician Assistants and Nurse Practitioners) who all work together to provide you with the care you need, when you need it.  We recommend signing up for the patient portal called "MyChart".  Sign up information is provided on this After Visit Summary.  MyChart is used to connect with patients for Virtual Visits (Telemedicine).  Patients are able to view lab/test results, encounter notes, upcoming appointments, etc.  Non-urgent messages can be sent to your provider as well.   To learn more about what you can do with MyChart, go to [email or web address].    Your next appointment:   6 month(s)  The format for your next appointment:   In Person  Provider:   Eleonore Chiquito, MD       Time Spent with Patient: I have spent a total of 25 minutes with patient reviewing hospital notes, telemetry, EKGs, labs and examining the patient as well as establishing an assessment and plan that was discussed with the patient.  > 50% of time was spent in direct patient care.  Signed, Addison Naegeli. Audie Box, [place]  [address] [place], [place] [address] [phone]  07/05/2020 11:01 AM

## 2020-07-05 ENCOUNTER — Ambulatory Visit (INDEPENDENT_AMBULATORY_CARE_PROVIDER_SITE_OTHER): Payer: 59 | Admitting: Cardiovascular Disease

## 2020-07-05 ENCOUNTER — Other Ambulatory Visit: Payer: Self-pay

## 2020-07-05 ENCOUNTER — Encounter: Payer: Self-pay | Admitting: Cardiovascular Disease

## 2020-07-05 VITALS — BP 116/70 | HR 61 | Ht 70.0 in | Wt 234.0 lb

## 2020-07-05 DIAGNOSIS — I480 Paroxysmal atrial fibrillation: Secondary | ICD-10-CM | POA: Diagnosis not present

## 2020-07-05 DIAGNOSIS — I471 Supraventricular tachycardia: Secondary | ICD-10-CM | POA: Diagnosis not present

## 2020-07-05 NOTE — Patient Instructions (Signed)
Medication Instructions:  Stop Metoprolol - let us know how you do when you stop this.   *If you need a refill on your cardiac medications before your next appointment, please call your pharmacy*   Follow-Up: At [facility], you and your health needs are our priority.  As part of our continuing mission to provide you with exceptional heart care, we have created designated Provider Care Teams.  These Care Teams include your primary Cardiologist (physician) and Advanced Practice Providers (APPs -  Physician Assistants and Nurse Practitioners) who all work together to provide you with the care you need, when you need it.  We recommend signing up for the patient portal called "MyChart".  Sign up information is provided on this After Visit Summary.  MyChart is used to connect with patients for Virtual Visits (Telemedicine).  Patients are able to view lab/test results, encounter notes, upcoming appointments, etc.  Non-urgent messages can be sent to your provider as well.   To learn more about what you can do with MyChart, go to [email or web address].    Your next appointment:   6 month(s)  The format for your next appointment:   In Person  Provider:   Eleonore Chiquito, MD

## 2020-08-12 ENCOUNTER — Encounter (HOSPITAL_BASED_OUTPATIENT_CLINIC_OR_DEPARTMENT_OTHER): Payer: 59 | Admitting: Cardiovascular Disease

## 2020-09-17 ENCOUNTER — Other Ambulatory Visit: Payer: Self-pay

## 2020-09-17 ENCOUNTER — Ambulatory Visit (HOSPITAL_BASED_OUTPATIENT_CLINIC_OR_DEPARTMENT_OTHER): Payer: 59 | Attending: Cardiovascular Disease | Admitting: Cardiovascular Disease

## 2020-09-17 DIAGNOSIS — R0683 Snoring: Secondary | ICD-10-CM

## 2020-09-17 DIAGNOSIS — R5382 Chronic fatigue, unspecified: Secondary | ICD-10-CM | POA: Diagnosis present

## 2020-09-17 DIAGNOSIS — G4719 Other hypersomnia: Secondary | ICD-10-CM

## 2020-09-17 DIAGNOSIS — G4733 Obstructive sleep apnea (adult) (pediatric): Secondary | ICD-10-CM | POA: Diagnosis not present

## 2020-09-26 ENCOUNTER — Encounter (HOSPITAL_BASED_OUTPATIENT_CLINIC_OR_DEPARTMENT_OTHER): Payer: Self-pay | Admitting: Cardiovascular Disease

## 2020-09-26 NOTE — Procedures (Signed)
     Patient Name: Jayzen, Paver Date: 09/17/2020 Gender: Male D.O.B: October 20, 1956 Age (years): 63 Referring Provider: Cassie Freer ONeal Height (inches): 39 Interpreting Physician: Shelva Majestic MD, ABSM Weight (lbs): 230 RPSGT: Jacolyn Reedy BMI: 33 MRN: [id number] Neck Size: 17.00  CLINICAL INFORMATION Sleep Study Type: HST  Indication for sleep study: Snoring, fatigue, PAF/atrial tachycardia, daytime sleepiness  Epworth Sleepiness Score: 14  SLEEP STUDY TECHNIQUE A multi-channel overnight portable sleep study was performed. The channels recorded were: nasal airflow, thoracic respiratory movement, and oxygen saturation with a pulse oximetry. Snoring was also monitored.  MEDICATIONS Ascorbic Acid (VITAMIN C) 1000 MG tablet Cyanocobalamin (VITAMIN B 12 PO) Multiple Vitamin (MULTIVITAMIN WITH MINERALS) TABS tablet nitrofurantoin (MACRODANTIN) 50 MG capsule omeprazole (PRILOSEC) 20 MG capsule ondansetron (ZOFRAN) 4 MG tablet Patient self administered medications include: N/A.  SLEEP ARCHITECTURE Patient was studied for 522.7 minutes. The sleep efficiency was 100.0 % and the patient was supine for 30.8%. The arousal index was 0.0 per hour.  RESPIRATORY PARAMETERS The overall AHI was 35.2 per hour, with a central apnea index of 0.0 per hour.  The oxygen nadir was 87% during sleep.  CARDIAC DATA Mean heart rate during sleep was 61.7 bpm.  IMPRESSIONS - Severe obstructive sleep apnea occurred during this study (AHI 35.2/h). There was a positional component with supine sleep AHI 42.5/h versus non-supine sleep AHI 32.02/h. - No significant central sleep apnea occurred during this study (CAI = 0.0/h). - Mild oxygen desaturation was noted during this study (Min O2 = 87%). - Patient snored 7.6% during the sleep.  DIAGNOSIS - Obstructive Sleep Apnea (G47.33)  RECOMMENDATIONS - In this patient with cardiovascular co-morbidities and severe sleep apnea,  recommend an in-lab CPAP titration study. - Effort should be made to optimize nasal and oropharyngeal patency. - Positional therapy avoiding supine position during sleep. - Avoid alcohol, sedatives and other CNS depressants that may worsen sleep apnea and disrupt normal sleep architecture. - Sleep hygiene should be reviewed to assess factors that may improve sleep quality. - Weight management (BMI 33) and regular exercise should be initiated or continued. - Recommend a download and sleep clinic evaluation after initiation of CPAP.    [Electronically signed] 09/26/2020 11:05 AM  Shelva Majestic MD, [facility], [place], American Board of Sleep Medicine   NPI: [id number] [place] PH: [phone]   FX: [phone] [place]

## 2020-09-28 ENCOUNTER — Telehealth: Payer: Self-pay | Admitting: Cardiovascular Disease

## 2020-09-28 ENCOUNTER — Telehealth: Payer: Self-pay | Admitting: *Deleted

## 2020-09-28 DIAGNOSIS — G4733 Obstructive sleep apnea (adult) (pediatric): Secondary | ICD-10-CM

## 2020-09-28 NOTE — Telephone Encounter (Signed)
New message:   Patients calling stating that he had a test last week. Patient states he thought he was going to have a sleep study. Patient hasn't heard from anyone please call patient.

## 2020-09-28 NOTE — Telephone Encounter (Signed)
-----   Message from Troy Sine, MD sent at 09/26/2020 11:10 AM EST ----- Craig Butler, please notify pt of the results and try to schedule for an in-lab CPAP titration

## 2020-09-28 NOTE — Telephone Encounter (Addendum)
Informed patient of sleep study results and patient understanding was verbalized. Patient understands her sleep study showed   IMPRESSIONS - Severe obstructive sleep apnea occurred during this study (AHI 35.2/h). There was a positional component with supine sleep AHI 42.5/h versus non-supine sleep AHI 32.02/h. - Mild oxygen desaturation was noted during this study (Min O2 = 87%). - Patient snored 7.6% during the sleep.  DIAGNOSIS - Obstructive Sleep Apnea (G47.33)  RECOMMENDATIONS - In this patient with cardiovascular co-morbidities and severe sleep apnea, recommend an in-lab CPAP titration study.  in-lab CPAP titration study sent to sleep pool

## 2020-09-28 NOTE — Telephone Encounter (Signed)
Spoke with pt, aware sleep study was read on the 14th and he should be hearing from someone this week about the recommendations. Note forwarded to nina jones.

## 2020-09-30 ENCOUNTER — Other Ambulatory Visit: Payer: Self-pay | Admitting: Otolaryngology

## 2020-09-30 DIAGNOSIS — H903 Sensorineural hearing loss, bilateral: Secondary | ICD-10-CM

## 2020-09-30 DIAGNOSIS — H9042 Sensorineural hearing loss, unilateral, left ear, with unrestricted hearing on the contralateral side: Secondary | ICD-10-CM

## 2020-10-01 NOTE — Addendum Note (Signed)
Addended by: Freada Bergeron on: 10/01/2020 12:47 PM   Modules accepted: Orders

## 2020-10-02 NOTE — Telephone Encounter (Signed)
Informed patient of sleep study results and patient understanding was verbalized.  

## 2020-10-04 ENCOUNTER — Telehealth: Payer: Self-pay | Admitting: Cardiovascular Disease

## 2020-10-04 NOTE — Telephone Encounter (Signed)
PA pending #[id number]

## 2020-10-11 NOTE — Telephone Encounter (Signed)
Still pending 10-11-20

## 2020-10-14 NOTE — Telephone Encounter (Signed)
In review with case worker now 10-14-20

## 2020-10-19 NOTE — Telephone Encounter (Signed)
Forgot he already had a HST.   I called [employer] back to make sure they had listed the primary dx correctly and it was.  I asked if they received Dr. Evette Georges review of the HST that I had attached and she couldn't tell me.  Says we have to call and do a peer to peer for approval.  We have 21 days from 10-18-20 to do this.  The number is [phone].

## 2020-10-19 NOTE — Telephone Encounter (Signed)
Insurance denied - not deemed medically necessary.  Want to do a HST?

## 2020-10-19 NOTE — Telephone Encounter (Signed)
Can we get this taken care of ASAP.  We really need this taken care of by the end of the year.  Craig Butler, [place]  [address] [place], [place] [address] [phone]  10:25 AM

## 2020-10-20 NOTE — Telephone Encounter (Signed)
Since CPAP titration denied; will set up for Auto-PAP at 7 - 20 cm with DME company and arrange for sleep clinic evaluation after 30 day download obtained.

## 2020-10-22 ENCOUNTER — Ambulatory Visit
Admission: RE | Admit: 2020-10-22 | Discharge: 2020-10-22 | Disposition: A | Discharge status: Home / Self Care | Payer: 59 | Source: Ambulatory Visit | Attending: Otolaryngology | Admitting: Otolaryngology

## 2020-10-22 ENCOUNTER — Telehealth: Payer: Self-pay | Admitting: *Deleted

## 2020-10-22 DIAGNOSIS — H9042 Sensorineural hearing loss, unilateral, left ear, with unrestricted hearing on the contralateral side: Secondary | ICD-10-CM

## 2020-10-22 DIAGNOSIS — H903 Sensorineural hearing loss, bilateral: Secondary | ICD-10-CM

## 2020-10-22 MED ORDER — GADOBENATE DIMEGLUMINE 529 MG/ML IV SOLN
20.0000 mL | Freq: Once | INTRAVENOUS | Status: AC | PRN
  Administered 2020-10-22: 20 mL via INTRAVENOUS

## 2020-10-22 NOTE — Telephone Encounter (Signed)
Craig Butler please set up with Auto-PAP   Since CPAP titration denied; will set up for Auto-PAP at 7 - 20 cm with DME company and arrange for sleep clinic evaluation after 30 day download obtained.      Auto pap order placed to [facility]

## 2020-10-22 NOTE — Telephone Encounter (Signed)
-----   Message from Newt Minion, RN sent at 10/22/2020  5:06 PM EST ----- Regarding: CPAP changes Gae Bon,   [place], where are things with this patient and setting up his Auto Pap...need me to do something?  K

## 2021-01-03 ENCOUNTER — Ambulatory Visit: Payer: 59 | Admitting: Cardiovascular Disease

## 2021-08-26 ENCOUNTER — Telehealth: Payer: Self-pay | Admitting: Cardiovascular Disease

## 2021-08-26 NOTE — Telephone Encounter (Signed)
Patient's sleep compliance appt due by 10/17, pt was contacted and told to either return his CPAP or purchase it. He purchased the machine and is not sure if a compliance appt is still needed. Please advise.

## 2021-09-19 ENCOUNTER — Ambulatory Visit: Payer: 59 | Admitting: Cardiovascular Disease

## 2021-09-19 ENCOUNTER — Other Ambulatory Visit: Payer: Self-pay

## 2021-09-19 ENCOUNTER — Encounter: Payer: Self-pay | Admitting: Cardiovascular Disease

## 2021-09-19 DIAGNOSIS — I471 Supraventricular tachycardia: Secondary | ICD-10-CM | POA: Diagnosis not present

## 2021-09-19 DIAGNOSIS — R5383 Other fatigue: Secondary | ICD-10-CM

## 2021-09-19 DIAGNOSIS — G4733 Obstructive sleep apnea (adult) (pediatric): Secondary | ICD-10-CM

## 2021-09-19 DIAGNOSIS — Z8546 Personal history of malignant neoplasm of prostate: Secondary | ICD-10-CM | POA: Diagnosis not present

## 2021-09-19 DIAGNOSIS — R0683 Snoring: Secondary | ICD-10-CM

## 2021-09-19 DIAGNOSIS — I480 Paroxysmal atrial fibrillation: Secondary | ICD-10-CM

## 2021-09-19 NOTE — Patient Instructions (Signed)
Medication Instructions:  Continue current medications. No changes.  *If you need a refill on your cardiac medications before your next appointment, please call your pharmacy*    Follow-Up: At [facility], you and your health needs are our priority.  As part of our continuing mission to provide you with exceptional heart care, we have created designated Provider Care Teams.  These Care Teams include your primary Cardiologist (physician) and Advanced Practice Providers (APPs -  Physician Assistants and Nurse Practitioners) who all work together to provide you with the care you need, when you need it.  We recommend signing up for the patient portal called "MyChart".  Sign up information is provided on this After Visit Summary.  MyChart is used to connect with patients for Virtual Visits (Telemedicine).  Patients are able to view lab/test results, encounter notes, upcoming appointments, etc.  Non-urgent messages can be sent to your provider as well.   To learn more about what you can do with MyChart, go to [email or web address].    Your next appointment:   12 month(s)  The format for your next appointment:   In Person  Provider:   Dr. Claiborne Billings

## 2021-09-19 NOTE — Progress Notes (Signed)
Cardiology Office Note    Date:  09/26/2021   ID:  Brayon, [place] 1956/07/31, MRN [id number]  PCP:  Lawerance Cruel, MD  Cardiologist:  Shelva Majestic, MD (sleep); Dr. Evalee Jefferson  New sleep evaluation   History of Present Illness:  Craig Butler is a 65 y.o. male who is followed by Dr. Davina Poke for cardiology care.  He has a history of paroxysmal atrial fibrillation in May 2021 which occurred in the setting of sepsis, as well as a history of atrial tachycardia and prostate CA.  He is followed by Dr. Mardene Sayer for his primary care.  Due to symptoms highly suggestive of sleep apnea including snoring, fatigue, PAF/atrial tachycardia and daytime sleepiness with an Epworth Sleepiness Scale score of 14, he was referred for a home sleep study which was done on September 17, 2020.  He was found to have severe obstructive sleep apnea with an overall AHI at 35.2.  He had significant positional component with supine sleep AHI at 42.5.  There was mild oxygen desaturation to a nadir of 87%.  Snoring was noted.  Bone in lab sleep study was recommended, apparently, he was started on AutoPap therapy and had a set up date on May 30, 2021.  His pressures were initially set at a minimum pressure of 7 to 20 cm of water.  I have not seen him since he initiated CPAP therapy.  He typically goes to bed from 10 PM and wakes up around 5:30 AM.  He works as an Chartered certified accountant.  He often takes naps after work.  A download was obtained from October 6 through September 16, 2021.  He was not meeting compliance standards with usage days at 17 of 30 days with average use at only 1 hour and 37 minutes.  AHI, however was excellent at 1.3 with a 95th percentile pressure at 7.5.  Note, the patient ended up paying for the machine after he did not meet compliance within 90 days of set up so that his machine was not returned to the DME company.  He has been using a ResMed AirFit N 30i mask.  Does not have  significant mask leak.  He presents for his initial sleep evaluation.  Past Medical History:  Diagnosis Date   Acid reflux    Anxiety    BPH (benign prostatic hyperplasia)    Depression    Frequent UTI    Migraine    Prostate cancer [facility])     Past Surgical History:  Procedure Laterality Date   BIOPSY PROSTATE      Current Medications: Outpatient Medications Prior to Visit  Medication Sig Dispense Refill   acetaminophen (TYLENOL) 500 MG tablet Take by mouth.     ALPRAZolam (XANAX) 0.25 MG tablet Take by mouth.     buPROPion (WELLBUTRIN XL) 300 MG 24 hr tablet Take 300 mg by mouth every morning.     clotrimazole-betamethasone (LOTRISONE) cream Apply topically.     Cyanocobalamin (VITAMIN B 12 PO) Take 1 capsule by mouth daily.     fluticasone (FLONASE) 50 MCG/ACT nasal spray Place into the nose.     ketoconazole (NIZORAL) 2 % cream Apply topically.     Multiple Vitamin (MULTIVITAMIN WITH MINERALS) TABS tablet Take 1 tablet by mouth daily.     omeprazole (PRILOSEC) 20 MG capsule Take 20 mg by mouth daily.     sildenafil (VIAGRA) 50 MG tablet Take by mouth.     Ascorbic Acid (VITAMIN  C) 1000 MG tablet Take 1,000 mg by mouth daily. (Patient not taking: Reported on 09/19/2021)     nitrofurantoin (MACRODANTIN) 50 MG capsule Take 50 mg by mouth at bedtime. (Patient not taking: Reported on 09/19/2021)     ondansetron (ZOFRAN) 4 MG tablet Take 1 tablet (4 mg total) by mouth every 6 (six) hours as needed for nausea. (Patient not taking: Reported on 09/19/2021) 20 tablet 0   No facility-administered medications prior to visit.     Allergies:   Amoxicillin   Social History   Socioeconomic History   Marital status: Married    Spouse name: Not on file   Number of children: Not on file   Years of education: Not on file   Highest education level: Not on file  Occupational History   Not on file  Tobacco Use   Smoking status: Former    Years: 10.00    Types: Cigarettes   Smokeless  tobacco: Never  Vaping Use   Vaping Use: Never used  Substance and Sexual Activity   Alcohol use: Not Currently   Drug use: Never   Sexual activity: Not on file  Other Topics Concern   Not on file  Social History Narrative   Not on file   Social Determinants of Health   Financial Resource Strain: Not on file  Food Insecurity: Not on file  Transportation Needs: Not on file  Physical Activity: Not on file  Stress: Not on file  Social Connections: Not on file     Socially, he is married for 26 years.  He has stepchildren and children with a daughter with sleep apnea.  Family History:  The patient's family history includes Cancer in his brother and father.  His father died at age 14 and had smoked and had cancer.  Mother died in her 60s.  He has 1 brother who died at 72, another brother died in his 2s and he has 3 sisters.  ROS General: Negative; No fevers, chills, or night sweats;  HEENT: Negative; No changes in vision or hearing, sinus congestion, difficulty swallowing Pulmonary: Negative; No cough, wheezing, shortness of breath, hemoptysis Cardiovascular: History of PAF in the setting of sepsis GI: Negative; No nausea, vomiting, diarrhea, or abdominal pain GU: Negative; No dysuria, hematuria, or difficulty voiding Musculoskeletal: Negative; no myalgias, joint pain, or weakness Hematologic/Oncology: Negative; no easy bruising, bleeding Endocrine: Negative; no heat/cold intolerance; no diabetes Neuro: Negative; no changes in balance, headaches Skin: Negative; No rashes or skin lesions Psychiatric: Negative; No behavioral problems, depression Sleep: Positive for OSA with snoring, occasional daytime naps, nonrestorative sleep, fatigability;  No bruxism, restless legs, hypnogognic hallucinations, no cataplexy Other comprehensive 14 point system review is negative.   PHYSICAL EXAM:   VS:  BP 120/76 (BP Location: Right Arm, Patient Position: Sitting, Cuff Size: Normal)   Pulse  60   Ht 5' 10"  (1.778 m)   Wt 238 lb (108 kg)   SpO2 98%   BMI 34.15 kg/m     Repeat blood pressure by me was 122/74  Wt Readings from Last 3 Encounters:  09/19/21 238 lb (108 kg)  07/05/20 234 lb (106.1 kg)  05/24/20 234 lb (106.1 kg)    General: Alert, oriented, no distress.  Skin: normal turgor, no rashes, warm and dry HEENT: Normocephalic, atraumatic. Pupils equal round and reactive to light; sclera anicteric; extraocular muscles intact; Nose without nasal septal hypertrophy Mouth/Parynx benign; Mallinpatti scale 3 Neck: No JVD, no carotid bruits; normal carotid upstroke Lungs: clear  to ausculatation and percussion; no wheezing or rales Chest wall: without tenderness to palpitation Heart: PMI not displaced, RRR, s1 s2 normal, 1/6 systolic murmur, no diastolic murmur, no rubs, gallops, thrills, or heaves Abdomen: soft, nontender; no hepatosplenomehaly, BS+; abdominal aorta nontender and not dilated by palpation. Back: no CVA tenderness Pulses 2+ Musculoskeletal: full range of motion, normal strength, no joint deformities Extremities: no clubbing cyanosis or edema, Homan's sign negative  Neurologic: grossly nonfocal; Cranial nerves grossly wnl Psychologic: Normal mood and affect   Studies/Labs Reviewed:   EKG:  EKG is ordered today.  ECG (independently read by me): NSR at 60, , no ectopy, normal intervals  Recent Labs: BMP Latest Ref Rng & Units 04/12/2020 04/11/2020 04/10/2020  Glucose 70 - 99 mg/dL 126(H) 115(H) 122(H)  BUN 8 - 23 mg/dL 10 9 11   Creatinine 0.61 - 1.24 mg/dL 1.03 1.05 1.14  Sodium 135 - 145 mmol/L 138 138 141  Potassium 3.5 - 5.1 mmol/L 3.7 3.9 3.7  Chloride 98 - 111 mmol/L 109 110 113(H)  CO2 22 - 32 mmol/L 22 23 21(L)  Calcium 8.9 - 10.3 mg/dL 8.0(L) 8.1(L) 8.2(L)     Hepatic Function Latest Ref Rng & Units 04/10/2020 04/08/2020 12/21/2019  Total Protein 6.5 - 8.1 g/dL 5.3(L) 7.1 6.9  Albumin 3.5 - 5.0 g/dL 2.9(L) 4.5 4.4  AST 15 - 41 U/L 16 21 30    ALT 0 - 44 U/L 17 20 31   Alk Phosphatase 38 - 126 U/L 52 82 76  Total Bilirubin 0.3 - 1.2 mg/dL 0.8 1.5(H) 0.9    CBC Latest Ref Rng & Units 04/12/2020 04/11/2020 04/10/2020  WBC 4.0 - 10.5 K/uL 7.0 4.8 8.0  Hemoglobin 13.0 - 17.0 g/dL 12.1(L) 12.8(L) 12.3(L)  Hematocrit 39.0 - 52.0 % 36.6(L) 39.7 37.5(L)  Platelets 150 - 400 K/uL 156 129(L) 136(L)   Lab Results  Component Value Date   MCV 92.9 04/12/2020   MCV 95.4 04/11/2020   MCV 94.9 04/10/2020   Lab Results  Component Value Date   TSH 2.390 04/11/2020   No results found for: HGBA1C   BNP No results found for: BNP  ProBNP No results found for: PROBNP   Lipid Panel  No results found for: CHOL, TRIG, HDL, CHOLHDL, VLDL, LDLCALC, LDLDIRECT, LABVLDL   RADIOLOGY: No results found.   Additional studies/ records that were reviewed today include:    09/17/2020 CLINICAL INFORMATION Sleep Study Type: HST   Indication for sleep study: Snoring, fatigue, PAF/atrial tachycardia, daytime sleepiness   Epworth Sleepiness Score: 14   SLEEP STUDY TECHNIQUE A multi-channel overnight portable sleep study was performed. The channels recorded were: nasal airflow, thoracic respiratory movement, and oxygen saturation with a pulse oximetry. Snoring was also monitored.   MEDICATIONS Ascorbic Acid (VITAMIN C) 1000 MG tablet Cyanocobalamin (VITAMIN B 12 PO) Multiple Vitamin (MULTIVITAMIN WITH MINERALS) TABS tablet nitrofurantoin (MACRODANTIN) 50 MG capsule omeprazole (PRILOSEC) 20 MG capsule ondansetron (ZOFRAN) 4 MG tablet Patient self administered medications include: N/A.   SLEEP ARCHITECTURE Patient was studied for 522.7 minutes. The sleep efficiency was 100.0 % and the patient was supine for 30.8%. The arousal index was 0.0 per hour.   RESPIRATORY PARAMETERS The overall AHI was 35.2 per hour, with a central apnea index of 0.0 per hour.   The oxygen nadir was 87% during sleep.   CARDIAC DATA Mean heart rate during  sleep was 61.7 bpm.   IMPRESSIONS - Severe obstructive sleep apnea occurred during this study (AHI 35.2/h). There was a  positional component with supine sleep AHI 42.5/h versus non-supine sleep AHI 32.02/h. - No significant central sleep apnea occurred during this study (CAI = 0.0/h). - Mild oxygen desaturation was noted during this study (Min O2 = 87%). - Patient snored 7.6% during the sleep.   DIAGNOSIS - Obstructive Sleep Apnea (G47.33)   RECOMMENDATIONS - In this patient with cardiovascular co-morbidities and severe sleep apnea, recommend an in-lab CPAP titration study. - Effort should be made to optimize nasal and oropharyngeal patency. - Positional therapy avoiding supine position during sleep. - Avoid alcohol, sedatives and other CNS depressants that may worsen sleep apnea and disrupt normal sleep architecture. - Sleep hygiene should be reviewed to assess factors that may improve sleep quality. - Weight management (BMI 33) and regular exercise should be initiated or continued. - Recommend a download and sleep clinic evaluation after initiation of CPAP.      ASSESSMENT:    1. OSA (obstructive sleep apnea)   2. Paroxysmal atrial fibrillation (HCC)   3. Atrial tachycardia ([place])   4. History of prostate cancer   5. Snoring   6. Easy fatigability     PLAN:  Mr. Masashi "Craig Butler" Hanzlik is a 65 year old gentleman who developed an episode of a fibrillation in May 2021 in the setting of sepsis and has a history of atrial tachycardia as well as prostate CA.  He had had issues with significant fatigability, snoring, daytime sleepiness, and underwent a home sleep study which was significant in demonstrating severe obstructive sleep apnea with an overall AHI of 35.2 but with more significant sleep apnea with supine sleep AHI at 42.5 versus nonsupine sleep AHI of 32.02.  I suspect he did not have his significant amount of REM sleep during that study and he was only noted to have mild oxygen  desaturation to a nadir of 87%.  Unfortunately due to supply chain issues, there was a significant delay in his receiving a CPAP machine and although it was recommended he have an in lab study he ultimately underwent AutoPap titration.  Currently, he did not meet compliance and fortunately for him instead of having his machine being taken away he paid for it himself in order to keep the machine.  I thoroughly reviewed his CPAP study with him today in detail and reviewed his downloads.  He is not meeting compliance standards.  I spent over 40 minutes with him today in the office and discussed normal sleep architecture, and the potential adverse effects on sleep architecture with untreated sleep apnea.  In addition we discussed at length adverse cardiovascular consequences including nocturnal palpitations, increased risk for atrial fibrillation, blood pressure elevation, increased inflammation, GERD, effects on glucose, as well as potential nocturnal hypoxemia contributing to nocturnal ischemia both cardiac and cerebrovascularly.  We also discussed its effects contributing to nocturia.  After much discussion, he understands now rationale while use of CPAP is very important for his cardiovascular health.  He was very appreciative of the time I spent discussing sleep apnea at length.  Presently, he is committed to try using CPAP longer particularly now that he understands the importance of treatment.  He has a ResMed AirFit N 30 I which seems to fit fairly well.  There were only rare days of mask leak.  Discussed with him the importance of optimal sleep duration with CPAP of 7 and 9 hours if at all possible.  We discussed that the preponderance of REM sleep occurs in the second half of the night and the importance of  him to continue using CPAP throughout the nights duration even after he wakes up to go to the bathroom.  We discussed putting the mask on before going back to bed which will help him falling asleep more  rapidly and have good quality restorative sleep.  His blood pressure today is well controlled.  He is on bupropion for depression and omeprazole for GERD.  We discussed optimal sleep hygiene.  I am making slight adjustments to his CPAP setting and will change him to a pressure range of 8 to 16 cm of water and will increase his ramp start pressure to 6 cm.  I will see him in 1 year for further evaluation or sooner as needed.   Time spent: > 40 minutes Medication Adjustments/Labs and Tests Ordered: Current medicines are reviewed at length with the patient today.  Concerns regarding medicines are outlined above.  Medication changes, Labs and Tests ordered today are listed in the Patient Instructions below. Patient Instructions  Medication Instructions:  Continue current medications. No changes.  *If you need a refill on your cardiac medications before your next appointment, please call your pharmacy*    Follow-Up: At [facility], you and your health needs are our priority.  As part of our continuing mission to provide you with exceptional heart care, we have created designated Provider Care Teams.  These Care Teams include your primary Cardiologist (physician) and Advanced Practice Providers (APPs -  Physician Assistants and Nurse Practitioners) who all work together to provide you with the care you need, when you need it.  We recommend signing up for the patient portal called "MyChart".  Sign up information is provided on this After Visit Summary.  MyChart is used to connect with patients for Virtual Visits (Telemedicine).  Patients are able to view lab/test results, encounter notes, upcoming appointments, etc.  Non-urgent messages can be sent to your provider as well.   To learn more about what you can do with MyChart, go to [email or web address].    Your next appointment:   12 month(s)  The format for your next appointment:   In Person  Provider:   Dr. Claiborne Billings           Signed, Shelva Majestic, MD  09/26/2021 6:56 PM    [facility] [address] [place], [place], [place]  [address] Phone: [phone]

## 2022-06-09 ENCOUNTER — Telehealth: Payer: Self-pay | Admitting: Cardiovascular Disease

## 2022-06-09 NOTE — Telephone Encounter (Signed)
Patient states he would like to see if he could do a sleep therapy test on a weekend.

## 2022-06-09 NOTE — Telephone Encounter (Signed)
Unable to reach pt or leave a message  It looks like he needs a sleep clinic follow up

## 2022-06-14 NOTE — Telephone Encounter (Signed)
Left message for patient to call and make a sleep clinic appointment with dr Claiborne Billings.

## 2022-06-14 NOTE — Telephone Encounter (Signed)
Patient called back. He states he does not need a follow up he needs a sleep study. Spoke with Hilda Blades and Wand who both confirmed per Dr. Evette Georges notes that the patient just needs a follow up with the sleep clinic. Patient insists he was told a couple months ago to have a sleep study. Patient stated he would come to the office to speak with Dr. Claiborne Billings himself. Informed patient I could send another message. He states he requests Dr. Claiborne Billings review this himself and respond.

## 2022-06-16 ENCOUNTER — Encounter: Payer: Self-pay | Admitting: Cardiovascular Disease

## 2022-06-21 NOTE — Telephone Encounter (Signed)
When I saw patient last year, the patient had not met CPAP compliance but continue to keep his machine.  His prior sleep study had shown severe sleep apnea.  If he still has his CPAP machine changes pressures 2 or auto range of 8 to 20 cm of water and his ramp start at 6 with auto ramp.  If he does not have his machine, schedule him for an in lab split-night CPAP titration study

## 2022-06-22 NOTE — Telephone Encounter (Signed)
Spoke to patient, he does still have his CPAP but is not using it. States the settings were adjusted in the past but he is still waking up gasping for air States the CPAP makes him feel claustrophobic. He would like to follow through the with another sleep study.

## 2022-06-22 NOTE — Telephone Encounter (Signed)
LVM for patient to call back and disuss if he still has his CPAP

## 2022-07-19 ENCOUNTER — Other Ambulatory Visit: Payer: Self-pay | Admitting: Cardiovascular Disease

## 2022-07-19 DIAGNOSIS — G4733 Obstructive sleep apnea (adult) (pediatric): Secondary | ICD-10-CM

## 2022-07-19 NOTE — Telephone Encounter (Signed)
Split night sleep study ordered.  

## 2022-07-31 DIAGNOSIS — G4733 Obstructive sleep apnea (adult) (pediatric): Secondary | ICD-10-CM | POA: Diagnosis not present

## 2022-07-31 DIAGNOSIS — J343 Hypertrophy of nasal turbinates: Secondary | ICD-10-CM | POA: Diagnosis not present

## 2022-08-08 DIAGNOSIS — C61 Malignant neoplasm of prostate: Secondary | ICD-10-CM | POA: Diagnosis not present

## 2022-08-25 ENCOUNTER — Ambulatory Visit (HOSPITAL_BASED_OUTPATIENT_CLINIC_OR_DEPARTMENT_OTHER): Payer: BC Managed Care – PPO | Attending: Cardiovascular Disease | Admitting: Cardiovascular Disease

## 2022-08-25 DIAGNOSIS — I480 Paroxysmal atrial fibrillation: Secondary | ICD-10-CM | POA: Insufficient documentation

## 2022-08-25 DIAGNOSIS — R0683 Snoring: Secondary | ICD-10-CM | POA: Diagnosis not present

## 2022-08-25 DIAGNOSIS — R5383 Other fatigue: Secondary | ICD-10-CM | POA: Insufficient documentation

## 2022-08-25 DIAGNOSIS — R400 Somnolence: Secondary | ICD-10-CM | POA: Diagnosis not present

## 2022-08-25 DIAGNOSIS — G4733 Obstructive sleep apnea (adult) (pediatric): Secondary | ICD-10-CM | POA: Insufficient documentation

## 2022-08-25 DIAGNOSIS — G4763 Sleep related bruxism: Secondary | ICD-10-CM | POA: Diagnosis not present

## 2022-08-29 ENCOUNTER — Encounter (HOSPITAL_BASED_OUTPATIENT_CLINIC_OR_DEPARTMENT_OTHER): Payer: Self-pay | Admitting: Cardiovascular Disease

## 2022-08-29 DIAGNOSIS — N3942 Incontinence without sensory awareness: Secondary | ICD-10-CM | POA: Diagnosis not present

## 2022-08-29 NOTE — Procedures (Signed)
Patient Name: Craig Butler, Craig Butler Date: 08/25/2022 Gender: Male D.O.B: 08-02-1956 Age (years): 69 Referring Provider: Shelva Majestic MD, ABSM Height (inches): 70 Interpreting Physician: Shelva Majestic MD, ABSM Weight (lbs): 230 RPSGT: Earney [place] BMI: 33 MRN: [id number] Neck Size: 16.50  CLINICAL INFORMATION Sleep Study Type: Split Night CPA  Indication for sleep study: OSA, snoring, daytime sleepiness, fatigue, PAF  Epworth Sleepiness Score: 11  SLEEP STUDY TECHNIQUE As per the AASM Manual for the Scoring of Sleep and Associated Events v2.3 (April 2016) with a hypopnea requiring 4% desaturations.  The channels recorded and monitored were frontal, central and occipital EEG, electrooculogram (EOG), submentalis EMG (chin), nasal and oral airflow, thoracic and abdominal wall motion, anterior tibialis EMG, snore microphone, electrocardiogram, and pulse oximetry. Continuous positive airway pressure (CPAP) was initiated when the patient met split night criteria and was titrated according to treat sleep-disordered breathing.  MEDICATIONS acetaminophen (TYLENOL) 500 MG tablet ALPRAZolam (XANAX) 0.25 MG tablet buPROPion (WELLBUTRIN XL) 300 MG 24 hr tablet clotrimazole-betamethasone (LOTRISONE) cream Cyanocobalamin (VITAMIN B 12 PO) fluticasone (FLONASE) 50 MCG/ACT nasal spray ketoconazole (NIZORAL) 2 % cream Multiple Vitamin (MULTIVITAMIN WITH MINERALS) TABS tablet omeprazole (PRILOSEC) 20 MG capsule sildenafil (VIAGRA) 50 MG tablet Medications self-administered by patient taken the night of the study : N/A  RESPIRATORY PARAMETERS Diagnostic Total AHI (/hr): 19.3 RDI (/hr): 22.4 OA Index (/hr): 1.6 CA Index (/hr): 1.6 REM AHI (/hr): N/A NREM AHI (/hr): 19.3 Supine AHI (/hr): 26.6 Non-supine AHI (/hr): 13.8 Min O2 Sat (%): 87.0 Mean O2 (%): 93.2 Time below 88% (min): 0.4   Titration Optimal Pressure (cm): 14 AHI at Optimal Pressure (/hr): 3.1 Min O2 at Optimal  Pressure (%): 91.0 Supine % at Optimal (%): 70 Sleep % at Optimal (%): 84   SLEEP ARCHITECTURE The recording time for the entire night was 367.2 minutes.  During a baseline period of 166.8 minutes, the patient slept for 152.5 minutes in REM and nonREM, yielding a sleep efficiency of 91.5%%. Sleep onset after lights out was 1.8 minutes with a REM latency of N/A minutes. The patient spent 3.6%% of the night in stage N1 sleep, 96.4%% in stage N2 sleep, 0.0%% in stage N3 and 0% in REM.  During the titration period of 196.3 minutes, the patient slept for 157.9 minutes in REM and nonREM, yielding a sleep efficiency of 80.4%%. Sleep onset after CPAP initiation was 1.9 minutes with a REM latency of 126.0 minutes. The patient spent 1.6%% of the night in stage N1 sleep, 73.1%% in stage N2 sleep, 0.0%% in stage N3 and 25.3% in REM.  CARDIAC DATA The 2 lead EKG demonstrated sinus rhythm. The mean heart rate was 100.0 beats per minute. Other EKG findings include: None.  LEG MOVEMENT DATA The total Periodic Limb Movements of Sleep (PLMS) were 0. The PLMS index was 0.0 .  IMPRESSIONS - Moderate obstructive sleep apnea occurred during the diagnostic portion of the study (AHI 19.3/h; RDI 22.4/h; absent REM sleep). Patient met split night criteria. CPAP was initiated at 5 cm and ws titrated to14 cm of water. REM sleep was only present at 14 cm (AHI 3.1/h; RDI 5.7/h, O2 nadir 91%). - No significant central sleep apnea occurred during the diagnostic portion of the study (CAI 1.6/h). - Mild oxygen desaturation was noted during the diagnostic portion of the study to a nadir of 87.0%. - The patient snored with loud snoring volume during the diagnostic portion of the study. - No cardiac abnormalities were noted during this study. -  Clinically significant periodic limb movements did not occur during sleep.  DIAGNOSIS - Obstructive Sleep Apnea (G47.33) - Bruxism (G47.63)  RECOMMENDATIONS - Recommend an inital  trial of CPAP Auto with EPR of 3 at 14 cm - 18  H2O with heated humidification. A Medium size Fisher&Paykel Full Face Simplus mask was used for the titration. - Effort should be made to optimize nasal and oropharybngeal patency. - Consider an oral guard for Bruxism. - Avoid alcohol, sedatives and other CNS depressants that may worsen sleep apnea and disrupt normal sleep architecture. - Sleep hygiene should be reviewed to assess factors that may improve sleep quality. - Weight management (BMI 33) and regular exercise should be initiated or continued. - Recommend a download and sleep clinic evaluation after 4 weeks of therapy  [Electronically signed] 08/29/2022 08:44 AM  Shelva Majestic MD, [facility], ABSM Diplomate, American Board of Sleep Medicine  NPI: [id number]  [place] PH: [phone]   FX: [phone] [place]

## 2022-09-06 ENCOUNTER — Telehealth: Payer: Self-pay | Admitting: *Deleted

## 2022-09-06 NOTE — Telephone Encounter (Signed)
Left message to return  a call to me to discuss sleep study recommendations. ( Patient's machine has been set to pressures per recent sleep study.)

## 2022-09-07 NOTE — Telephone Encounter (Signed)
Left message to return a call to me. 

## 2022-09-12 NOTE — Telephone Encounter (Signed)
Patient returned a call to me and was notified that his CPAP machine has been set to the pressures ordered by Dr Claiborne Billings. Asked if he has a mask and he states he does but he needs something different due to feeling claustrophobic. He stated that when he was previously on CPAP he has a nasal mask.  I offered for him to come by to get a sample  mask.Patient works and lives part time in [place]. He states that it will be hard for him to come by. He could possibly have one of his children come by to get it. He asks if I can send him the name of the mask and he can look it up online to possibly purchase if he feels he can use it. Names of N30i and F30i given to the patient.

## 2022-09-18 DIAGNOSIS — C61 Malignant neoplasm of prostate: Secondary | ICD-10-CM | POA: Diagnosis not present

## 2022-09-20 DIAGNOSIS — J343 Hypertrophy of nasal turbinates: Secondary | ICD-10-CM | POA: Diagnosis not present

## 2022-09-20 DIAGNOSIS — G4733 Obstructive sleep apnea (adult) (pediatric): Secondary | ICD-10-CM | POA: Diagnosis not present

## 2022-09-27 DIAGNOSIS — L820 Inflamed seborrheic keratosis: Secondary | ICD-10-CM | POA: Diagnosis not present

## 2022-09-27 DIAGNOSIS — D225 Melanocytic nevi of trunk: Secondary | ICD-10-CM | POA: Diagnosis not present

## 2022-10-27 DIAGNOSIS — J343 Hypertrophy of nasal turbinates: Secondary | ICD-10-CM | POA: Diagnosis not present

## 2022-10-27 DIAGNOSIS — G4733 Obstructive sleep apnea (adult) (pediatric): Secondary | ICD-10-CM | POA: Diagnosis not present

## 2022-11-03 DIAGNOSIS — H25813 Combined forms of age-related cataract, bilateral: Secondary | ICD-10-CM | POA: Diagnosis not present

## 2022-11-09 DIAGNOSIS — J343 Hypertrophy of nasal turbinates: Secondary | ICD-10-CM | POA: Diagnosis not present

## 2022-11-09 DIAGNOSIS — G4733 Obstructive sleep apnea (adult) (pediatric): Secondary | ICD-10-CM | POA: Diagnosis not present

## 2023-01-29 DIAGNOSIS — C61 Malignant neoplasm of prostate: Secondary | ICD-10-CM | POA: Diagnosis not present

## 2023-02-12 DIAGNOSIS — F339 Major depressive disorder, recurrent, unspecified: Secondary | ICD-10-CM | POA: Diagnosis not present

## 2023-02-12 DIAGNOSIS — F411 Generalized anxiety disorder: Secondary | ICD-10-CM | POA: Diagnosis not present

## 2023-02-12 DIAGNOSIS — E669 Obesity, unspecified: Secondary | ICD-10-CM | POA: Diagnosis not present

## 2023-05-21 DIAGNOSIS — Z79899 Other long term (current) drug therapy: Secondary | ICD-10-CM | POA: Diagnosis not present

## 2023-05-21 DIAGNOSIS — Z0000 Encounter for general adult medical examination without abnormal findings: Secondary | ICD-10-CM | POA: Diagnosis not present

## 2023-05-21 DIAGNOSIS — F339 Major depressive disorder, recurrent, unspecified: Secondary | ICD-10-CM | POA: Diagnosis not present

## 2023-05-21 DIAGNOSIS — C61 Malignant neoplasm of prostate: Secondary | ICD-10-CM | POA: Diagnosis not present

## 2023-05-21 DIAGNOSIS — Z23 Encounter for immunization: Secondary | ICD-10-CM | POA: Diagnosis not present

## 2023-05-21 DIAGNOSIS — K219 Gastro-esophageal reflux disease without esophagitis: Secondary | ICD-10-CM | POA: Diagnosis not present

## 2023-05-21 DIAGNOSIS — F411 Generalized anxiety disorder: Secondary | ICD-10-CM | POA: Diagnosis not present

## 2023-05-21 DIAGNOSIS — E782 Mixed hyperlipidemia: Secondary | ICD-10-CM | POA: Diagnosis not present

## 2023-07-31 DIAGNOSIS — K317 Polyp of stomach and duodenum: Secondary | ICD-10-CM | POA: Diagnosis not present

## 2023-07-31 DIAGNOSIS — K6289 Other specified diseases of anus and rectum: Secondary | ICD-10-CM | POA: Diagnosis not present

## 2023-07-31 DIAGNOSIS — K635 Polyp of colon: Secondary | ICD-10-CM | POA: Diagnosis not present

## 2023-07-31 DIAGNOSIS — R1011 Right upper quadrant pain: Secondary | ICD-10-CM | POA: Diagnosis not present

## 2023-07-31 DIAGNOSIS — K648 Other hemorrhoids: Secondary | ICD-10-CM | POA: Diagnosis not present

## 2023-07-31 DIAGNOSIS — K5730 Diverticulosis of large intestine without perforation or abscess without bleeding: Secondary | ICD-10-CM | POA: Diagnosis not present

## 2023-07-31 DIAGNOSIS — K2970 Gastritis, unspecified, without bleeding: Secondary | ICD-10-CM | POA: Diagnosis not present

## 2023-07-31 DIAGNOSIS — K625 Hemorrhage of anus and rectum: Secondary | ICD-10-CM | POA: Diagnosis not present

## 2024-01-08 DIAGNOSIS — C61 Malignant neoplasm of prostate: Secondary | ICD-10-CM | POA: Diagnosis not present

## 2024-03-07 DIAGNOSIS — Z6834 Body mass index (BMI) 34.0-34.9, adult: Secondary | ICD-10-CM | POA: Diagnosis not present

## 2024-03-07 DIAGNOSIS — R300 Dysuria: Secondary | ICD-10-CM | POA: Diagnosis not present

## 2024-03-14 DIAGNOSIS — Z09 Encounter for follow-up examination after completed treatment for conditions other than malignant neoplasm: Secondary | ICD-10-CM | POA: Diagnosis not present

## 2024-03-14 DIAGNOSIS — N390 Urinary tract infection, site not specified: Secondary | ICD-10-CM | POA: Diagnosis not present

## 2024-03-14 DIAGNOSIS — Z6833 Body mass index (BMI) 33.0-33.9, adult: Secondary | ICD-10-CM | POA: Diagnosis not present

## 2024-03-14 DIAGNOSIS — R300 Dysuria: Secondary | ICD-10-CM | POA: Diagnosis not present

## 2024-06-02 DIAGNOSIS — K2100 Gastro-esophageal reflux disease with esophagitis, without bleeding: Secondary | ICD-10-CM | POA: Diagnosis not present

## 2024-06-02 DIAGNOSIS — Z23 Encounter for immunization: Secondary | ICD-10-CM | POA: Diagnosis not present

## 2024-06-02 DIAGNOSIS — C61 Malignant neoplasm of prostate: Secondary | ICD-10-CM | POA: Diagnosis not present

## 2024-06-02 DIAGNOSIS — Z0000 Encounter for general adult medical examination without abnormal findings: Secondary | ICD-10-CM | POA: Diagnosis not present

## 2024-06-02 DIAGNOSIS — Z79899 Other long term (current) drug therapy: Secondary | ICD-10-CM | POA: Diagnosis not present

## 2024-06-02 DIAGNOSIS — F339 Major depressive disorder, recurrent, unspecified: Secondary | ICD-10-CM | POA: Diagnosis not present

## 2024-06-02 DIAGNOSIS — R32 Unspecified urinary incontinence: Secondary | ICD-10-CM | POA: Diagnosis not present

## 2024-06-02 DIAGNOSIS — E782 Mixed hyperlipidemia: Secondary | ICD-10-CM | POA: Diagnosis not present

## 2024-06-10 DIAGNOSIS — B078 Other viral warts: Secondary | ICD-10-CM | POA: Diagnosis not present

## 2024-06-10 DIAGNOSIS — X32XXXA Exposure to sunlight, initial encounter: Secondary | ICD-10-CM | POA: Diagnosis not present

## 2024-06-10 DIAGNOSIS — L820 Inflamed seborrheic keratosis: Secondary | ICD-10-CM | POA: Diagnosis not present

## 2024-06-10 DIAGNOSIS — L570 Actinic keratosis: Secondary | ICD-10-CM | POA: Diagnosis not present

## 2024-07-24 DIAGNOSIS — H43813 Vitreous degeneration, bilateral: Secondary | ICD-10-CM | POA: Diagnosis not present

## 2024-07-24 DIAGNOSIS — H04123 Dry eye syndrome of bilateral lacrimal glands: Secondary | ICD-10-CM | POA: Diagnosis not present

## 2024-07-24 DIAGNOSIS — H25813 Combined forms of age-related cataract, bilateral: Secondary | ICD-10-CM | POA: Diagnosis not present

## 2024-07-31 ENCOUNTER — Other Ambulatory Visit: Payer: Self-pay | Admitting: Medical Genetics

## 2024-08-28 DIAGNOSIS — H25813 Combined forms of age-related cataract, bilateral: Secondary | ICD-10-CM | POA: Diagnosis not present

## 2024-08-28 DIAGNOSIS — H04123 Dry eye syndrome of bilateral lacrimal glands: Secondary | ICD-10-CM | POA: Diagnosis not present

## 2024-08-28 DIAGNOSIS — H43813 Vitreous degeneration, bilateral: Secondary | ICD-10-CM | POA: Diagnosis not present

## 2024-09-09 DIAGNOSIS — Z8546 Personal history of malignant neoplasm of prostate: Secondary | ICD-10-CM | POA: Diagnosis not present

## 2024-09-09 DIAGNOSIS — K5900 Constipation, unspecified: Secondary | ICD-10-CM | POA: Diagnosis not present

## 2024-09-09 DIAGNOSIS — K921 Melena: Secondary | ICD-10-CM | POA: Diagnosis not present

## 2024-09-09 DIAGNOSIS — Z923 Personal history of irradiation: Secondary | ICD-10-CM | POA: Diagnosis not present

## 2024-09-17 DIAGNOSIS — H25811 Combined forms of age-related cataract, right eye: Secondary | ICD-10-CM | POA: Diagnosis not present

## 2024-09-17 DIAGNOSIS — H269 Unspecified cataract: Secondary | ICD-10-CM | POA: Diagnosis not present

## 2024-09-22 DIAGNOSIS — Z961 Presence of intraocular lens: Secondary | ICD-10-CM | POA: Diagnosis not present

## 2024-09-22 DIAGNOSIS — H25812 Combined forms of age-related cataract, left eye: Secondary | ICD-10-CM | POA: Diagnosis not present

## 2024-09-24 ENCOUNTER — Ambulatory Visit: Attending: Student in an Organized Health Care Education/Training Program | Admitting: Cardiology

## 2024-09-24 ENCOUNTER — Encounter: Payer: Self-pay | Admitting: Cardiology

## 2024-09-24 ENCOUNTER — Other Ambulatory Visit (HOSPITAL_COMMUNITY): Payer: Self-pay

## 2024-09-24 VITALS — BP 148/87 | HR 86 | Ht 69.0 in | Wt 230.0 lb

## 2024-09-24 DIAGNOSIS — G4733 Obstructive sleep apnea (adult) (pediatric): Secondary | ICD-10-CM | POA: Insufficient documentation

## 2024-09-24 DIAGNOSIS — E782 Mixed hyperlipidemia: Secondary | ICD-10-CM | POA: Insufficient documentation

## 2024-09-24 DIAGNOSIS — R072 Precordial pain: Secondary | ICD-10-CM | POA: Diagnosis not present

## 2024-09-24 MED ORDER — ASPIRIN 81 MG PO TBEC
81.0000 mg | DELAYED_RELEASE_TABLET | Freq: Every day | ORAL | 3 refills | Status: AC
  Filled 2024-09-24: qty 90, 90d supply, fill #0

## 2024-09-24 MED ORDER — NITROGLYCERIN 0.4 MG SL SUBL
0.4000 mg | SUBLINGUAL_TABLET | SUBLINGUAL | 3 refills | Status: AC | PRN
  Filled 2024-09-24: qty 25, 1d supply, fill #0
  Filled 2024-09-24: qty 25, 15d supply, fill #0

## 2024-09-24 NOTE — Patient Instructions (Signed)
 Medication Instructions:  STOP Sildenafil  START Aspirin  81 mg daily  START Nitroglycerin as needed for chest pain   *If you need a refill on your cardiac medications before your next appointment, please call your pharmacy*  Referral for sleep clinic with Dr. Shlomo   Testing/Procedures: PET/CT   How to Prepare for Your Cardiac PET/CT Stress Test:  1. Please do not take these medications before your test:  ~Medications that may interfere with the cardiac pharmacological stress agent (ex. nitrates - including erectile dysfunction medications, isosorbide mononitrate, tamulosin or beta-blockers) the day of the exam. (Erectile dysfunction medication should be held for at least 72 hrs prior to test) ~Theophylline containing medications for 12 hours. ~Dipyridamole 48 hours prior to the test. ~Your remaining medications may be taken with water.  2. Nothing to eat or drink, except water, 3 hours prior to arrival time.   ~ NO caffeine/decaffeinated products, or chocolate 12 hours prior to arrival.  3. NO perfume, cologne or lotion on chest or abdomen area.         - FEMALES - Please avoid wearing dresses to this appointment.  4. Total time is 1 to 2 hours; you may want to bring reading material for the waiting time.  Please report to Radiology at the [facility] Main Entrance 30 minutes early for your test. [address] [place], [place] [address]  IF YOU THINK YOU MAY BE PREGNANT, OR ARE NURSING PLEASE INFORM THE TECHNOLOGIST.  In preparation for your appointment, medication and supplies will be purchased.  Appointment availability is limited, so if you need to cancel or reschedule, please call the Radiology Department at [phone] [place] [place]) OR [phone] [facility])  24 hours in advance to avoid a cancellation fee of $100.00  What to Expect After you Arrive:  Once you arrive and check in for your appointment, you will be taken to a preparation room within the  Radiology Department.  A technologist or Nurse will obtain your medical history, verify that you are correctly prepped for the exam, and explain the procedure.  Afterwards,  an IV will be started in your arm and electrodes will be placed on your skin for EKG monitoring during the stress portion of the exam. Then you will be escorted to the PET/CT scanner.  There, staff will get you positioned on the scanner and obtain a blood pressure and EKG.  During the exam, you will continue to be connected to the EKG and blood pressure machines.  A small, safe amount of a radioactive tracer will be injected in your IV to obtain a series of pictures of your heart along with an injection of a stress agent.    After your Exam:  It is recommended that you eat a meal and drink a caffeinated beverage to counter act any effects of the stress agent.  Drink plenty of fluids for the remainder of the day and urinate frequently for the first couple of hours after the exam.  Your doctor will inform you of your test results within 7-10 business days.  For more information and frequently asked questions, please visit our website : [email or web address]  For questions about your test or how to prepare for your test, please call: Cardiac Imaging Nurse Navigators Office: [phone]    Follow-Up: At [facility], you and your health needs are our priority.  As part of our continuing mission to provide you with exceptional heart care, our providers are all part of one team.  This  team includes your primary Cardiologist (physician) and Advanced Practice Providers or APPs (Physician Assistants and Nurse Practitioners) who all work together to provide you with the care you need, when you need it.  Your next appointment:   6-8 week(s)  Provider:   Newman JINNY Lawrence, MD

## 2024-09-24 NOTE — Progress Notes (Signed)
 Cardiology Office Note:  .   Date:  09/24/2024  ID:  Craig Butler, DOB 02-22-1956, MRN [id number] PCP: Okey Carlin Redbird, MD  [facility] Providers Cardiologist:  Newman Lawrence, MD PCP: Okey Carlin Redbird, MD  Chief Complaint  Patient presents with   Chest Pain     Craig Butler is a 68 y.o. male with OSA, chest pain  Discussed the use of AI scribe software for clinical note transcription with the patient, who gave verbal consent to proceed.  History of Present Illness Craig Butler is a 68 year old male who presents with chest pain.  He experiences chest pain while sitting and watching television or working on his laptop. The pain starts in the chest with a sensation of pressure that takes his breath away and sometimes radiates to the shoulder. Episodes last a couple of minutes and resolve when he gets up and walks around. There is no association with physical activity, as he does not engage in much due to his job and fatigue.  He has severe sleep apnea, diagnosed via a sleep test, and was unable to tolerate CPAP therapy. He is interested in exploring alternative treatments for his sleep apnea.  He quit smoking and drinking over 25 years ago. He works on youth worker, reporting low physical activity due to fatigue from work. No current episodes of tachycardia during strenuous activity.  Prior workup with stress test in 2021 showed large defect of moderate severity present in the basal inferior and mid inferior and apical location, but was thought to be due to thinning of the inferior wall rather than true infarct/ischemia.  Echocardiogram showed structurally normal heart.  Monitor showed episodes of atrial tachycardia that did not correlate with symptoms.      Vitals:   09/24/24 1459  BP: (!) 148/87  Pulse: 86  SpO2: 97%      Review of Systems  Cardiovascular:  Positive for chest pain. Negative for dyspnea on exertion, leg swelling,  palpitations and syncope.  Respiratory:  Positive for snoring.         Studies Reviewed: SABRA        EKG 09/24/2024: Normal sinus rhythm Septal infarct , age undetermined When compared with ECG of 10-Apr-2020 12:00, No significant change was found     Echocardiogram 2021: 1. Normal LV systolic function; mild LVH.   2. Left ventricular ejection fraction, by estimation, is 60 to 65%. The  left ventricle has normal function. The left ventricle has no regional  wall motion abnormalities. There is mild left ventricular hypertrophy.  Left ventricular diastolic parameters  were normal.   3. Right ventricular systolic function is normal. The right ventricular  size is normal.   4. The mitral valve is normal in structure. Trivial mitral valve  regurgitation. No evidence of mitral stenosis.   5. The aortic valve is tricuspid. Aortic valve regurgitation is not  visualized. No aortic stenosis is present.   6. The inferior vena cava is dilated in size with >50% respiratory  variability, suggesting right atrial pressure of 8 mmHg.   Stress test 2021: The left ventricular ejection fraction is normal (55-65%). Nuclear stress EF: 59%. There was no ST segment deviation noted during stress. Defect 1: There is a large defect of moderate severity present in the basal inferior, mid inferior and apex location. This is a low risk study.   Low risk, mildly abnormal stress nuclear study with thinning of the inferior wall and apex versus  small prior infarct.  No ischemia.  Gated ejection fraction 59% with normal wall motion.   Long term monitor 2021: 1. Frequent ectopic atrial tachycardia episodes were detected. Symptoms reported mainly with normal sinus rhythm. 2. No atrial fibrillation or flutter.  3. Rare ectopy.   Labs 05/2024: Chol 165, TG 160, HDL 39, LDL 98 Hb 13.9 TSH 1.2    Physical Exam Vitals and nursing note reviewed.  Constitutional:      General: He is not in acute  distress. Neck:     Vascular: No JVD.  Cardiovascular:     Rate and Rhythm: Normal rate and regular rhythm.     Heart sounds: Normal heart sounds. No murmur heard. Pulmonary:     Effort: Pulmonary effort is normal.     Breath sounds: Normal breath sounds. No wheezing or rales.  Musculoskeletal:     Right lower leg: No edema.     Left lower leg: No edema.      VISIT DIAGNOSES:   ICD-10-CM   1. Precordial pain  R07.2 NM PET CT CARDIAC PERFUSION MULTI W/ABSOLUTE BLOODFLOW    Cardiac Stress Test: Informed Consent Details: Physician/Practitioner Attestation; Transcribe to consent form and obtain patient signature    2. Mixed hyperlipidemia  E78.2 EKG 12-Lead    3. OSA (obstructive sleep apnea)  G47.33 Ambulatory referral to Sleep Studies    CANCELED: Ambulatory referral to Sleep Studies       Craig Butler is a 68 y.o. male with OSA, chest pain Assessment and Plan Assessment & Plan Chest pain, evaluation for coronary artery disease: Intermittent chest pain with pressure and shortness of breath.  There is question of inferior wall thinning versus infarct or previous nuclear stress test in 2021.   Recommend PET/CT stress test for more specific evaluation for ischemia/infarction. Continue further information is obtained, recommend aspirin  and sublingual nitroglycerin.  Obstructive sleep apnea: Severe obstructive sleep apnea diagnosed. Previous CPAP trial unsuccessful. Interested in [place] therapy. - Referred to Dr. Shlomo for management.  Elevated blood pressure without diagnosis of hypertension: Blood pressure normal.  Did not start antihypertensive therapy today, recommend regular home monitoring.    Informed Consent   Shared Decision Making/Informed Consent The risks [chest pain, shortness of breath, cardiac arrhythmias, dizziness, blood pressure fluctuations, myocardial infarction, stroke/transient ischemic attack, nausea, vomiting, allergic reaction, radiation  exposure, metallic taste sensation and life-threatening complications (estimated to be 1 in 10,000)], benefits (risk stratification, diagnosing coronary artery disease, treatment guidance) and alternatives of a cardiac PET stress test were discussed in detail with Mr. Smithey and he agrees to proceed.       Meds ordered this encounter  Medications   aspirin  EC 81 MG tablet    Sig: Take 1 tablet (81 mg total) by mouth daily. Swallow whole.    Dispense:  90 tablet    Refill:  3   nitroGLYCERIN (NITROSTAT) 0.4 MG SL tablet    Sig: Place 1 tablet (0.4 mg total) under the tongue every 5 (five) minutes as needed for chest pain.    Dispense:  25 tablet    Refill:  3     F/u in 6-8 weeks  Signed, Newman JINNY Lawrence, MD

## 2024-09-29 DIAGNOSIS — H269 Unspecified cataract: Secondary | ICD-10-CM | POA: Diagnosis not present

## 2024-09-29 DIAGNOSIS — I10 Essential (primary) hypertension: Secondary | ICD-10-CM | POA: Diagnosis not present

## 2024-09-29 DIAGNOSIS — H25812 Combined forms of age-related cataract, left eye: Secondary | ICD-10-CM | POA: Diagnosis not present

## 2024-10-16 ENCOUNTER — Other Ambulatory Visit: Payer: PRIVATE HEALTH INSURANCE

## 2024-10-20 ENCOUNTER — Encounter (HOSPITAL_COMMUNITY): Payer: Self-pay

## 2024-10-22 ENCOUNTER — Ambulatory Visit: Payer: Self-pay | Admitting: Cardiology

## 2024-10-22 ENCOUNTER — Encounter (HOSPITAL_COMMUNITY)
Admission: RE | Admit: 2024-10-22 | Discharge: 2024-10-22 | Disposition: A | Payer: PRIVATE HEALTH INSURANCE | Source: Ambulatory Visit | Attending: Cardiology

## 2024-10-22 DIAGNOSIS — R072 Precordial pain: Secondary | ICD-10-CM | POA: Insufficient documentation

## 2024-10-22 DIAGNOSIS — Z0189 Encounter for other specified special examinations: Secondary | ICD-10-CM | POA: Diagnosis not present

## 2024-10-22 LAB — NM PET CT CARDIAC PERFUSION MULTI W/ABSOLUTE BLOODFLOW
MBFR: 2.65
Nuc Rest EF: 54 %
Nuc Stress EF: 65 %
Rest MBF: 0.72 ml/g/min
Rest Nuclear Isotope Dose: 27.2 mCi
ST Depression (mm): 0 mm
Stress MBF: 1.91 ml/g/min
Stress Nuclear Isotope Dose: 26.9 mCi
TID: 0.99

## 2024-10-22 MED ORDER — CAFFEINE CITRATE BASE COMPONENT 10 MG/ML IV SOLN
INTRAVENOUS | Status: AC
  Filled 2024-10-22: qty 3

## 2024-10-22 MED ORDER — RUBIDIUM RB82 GENERATOR (RUBYFILL)
27.2000 | PACK | Freq: Once | INTRAVENOUS | Status: AC
  Administered 2024-10-22: 27.2 via INTRAVENOUS

## 2024-10-22 MED ORDER — RUBIDIUM RB82 GENERATOR (RUBYFILL)
27.2000 | PACK | Freq: Once | INTRAVENOUS | Status: AC
  Administered 2024-10-22: 26.95 via INTRAVENOUS

## 2024-10-22 MED ORDER — REGADENOSON 0.4 MG/5ML IV SOLN
0.4000 mg | Freq: Once | INTRAVENOUS | Status: AC
  Administered 2024-10-22: 0.4 mg via INTRAVENOUS

## 2024-10-22 MED ORDER — REGADENOSON 0.4 MG/5ML IV SOLN
INTRAVENOUS | Status: AC
  Filled 2024-10-22: qty 5

## 2024-10-22 NOTE — Progress Notes (Signed)
 Pt. Tolerated lexi scan well.

## 2024-10-22 NOTE — Progress Notes (Signed)
 Moderate coronary calification, but No significant heart muscle circulation abnormalities noted on stress test. Okay to stop Aspirin , but recommend statin given coronary calcification. LDL 98. Recommend Crestor 20 mg daily,. Repeat lipid panel in 3 months.  Thanks MJP

## 2024-10-23 NOTE — Progress Notes (Signed)
 Unable to leave message will try again at later time.

## 2024-10-29 ENCOUNTER — Inpatient Hospital Stay (HOSPITAL_COMMUNITY): Admission: RE | Admit: 2024-10-29 | Discharge: 2024-10-29 | Payer: Self-pay

## 2024-11-04 DIAGNOSIS — K625 Hemorrhage of anus and rectum: Secondary | ICD-10-CM | POA: Diagnosis not present

## 2024-11-04 DIAGNOSIS — K627 Radiation proctitis: Secondary | ICD-10-CM | POA: Diagnosis not present

## 2024-11-07 LAB — GENECONNECT MOLECULAR SCREEN: Genetic Analysis Overall Interpretation: NEGATIVE

## 2024-12-12 ENCOUNTER — Ambulatory Visit: Payer: PRIVATE HEALTH INSURANCE | Admitting: Cardiology
